# Patient Record
Sex: Male | Born: 1955 | Race: Black or African American | Hispanic: No | Marital: Single | Smoking: Never smoker
Health system: Southern US, Community
[De-identification: ages and names within clinical notes are randomized; demographics above are authoritative.]

## PROBLEM LIST (undated history)

## (undated) DIAGNOSIS — E119 Type 2 diabetes mellitus without complications: Secondary | ICD-10-CM

## (undated) HISTORY — PX: SHOULDER SURGERY: SHX246

---

## 2015-06-25 ENCOUNTER — Emergency Department (HOSPITAL_COMMUNITY): Payer: Non-veteran care

## 2015-06-25 ENCOUNTER — Encounter (HOSPITAL_COMMUNITY): Payer: Self-pay | Admitting: Emergency Medicine

## 2015-06-25 ENCOUNTER — Inpatient Hospital Stay (HOSPITAL_COMMUNITY): Payer: Non-veteran care

## 2015-06-25 ENCOUNTER — Inpatient Hospital Stay (HOSPITAL_COMMUNITY)
Admission: EM | Admit: 2015-06-25 | Discharge: 2015-07-06 | DRG: 336 | Disposition: A | Discharge status: Home / Self Care | Payer: Non-veteran care | Attending: General Surgery | Admitting: General Surgery

## 2015-06-25 DIAGNOSIS — E860 Dehydration: Secondary | ICD-10-CM | POA: Diagnosis present

## 2015-06-25 DIAGNOSIS — E876 Hypokalemia: Secondary | ICD-10-CM | POA: Diagnosis present

## 2015-06-25 DIAGNOSIS — Z0189 Encounter for other specified special examinations: Secondary | ICD-10-CM

## 2015-06-25 DIAGNOSIS — R188 Other ascites: Secondary | ICD-10-CM | POA: Diagnosis present

## 2015-06-25 DIAGNOSIS — Z9114 Patient's other noncompliance with medication regimen: Secondary | ICD-10-CM | POA: Diagnosis not present

## 2015-06-25 DIAGNOSIS — R739 Hyperglycemia, unspecified: Secondary | ICD-10-CM

## 2015-06-25 DIAGNOSIS — K565 Intestinal adhesions [bands] with obstruction (postprocedural) (postinfection): Principal | ICD-10-CM | POA: Diagnosis present

## 2015-06-25 DIAGNOSIS — K56609 Unspecified intestinal obstruction, unspecified as to partial versus complete obstruction: Secondary | ICD-10-CM | POA: Diagnosis present

## 2015-06-25 DIAGNOSIS — E119 Type 2 diabetes mellitus without complications: Secondary | ICD-10-CM | POA: Diagnosis present

## 2015-06-25 HISTORY — DX: Type 2 diabetes mellitus without complications: E11.9

## 2015-06-25 LAB — COMPREHENSIVE METABOLIC PANEL
ALBUMIN: 3.9 g/dL (ref 3.5–5.0)
ALK PHOS: 106 U/L (ref 38–126)
ALT: 17 U/L (ref 17–63)
ANION GAP: 15 (ref 5–15)
AST: 16 U/L (ref 15–41)
BILIRUBIN TOTAL: 1.5 mg/dL — AB (ref 0.3–1.2)
BUN: 20 mg/dL (ref 6–20)
CALCIUM: 9.3 mg/dL (ref 8.9–10.3)
CO2: 28 mmol/L (ref 22–32)
Chloride: 89 mmol/L — ABNORMAL LOW (ref 101–111)
Creatinine, Ser: 1.72 mg/dL — ABNORMAL HIGH (ref 0.61–1.24)
GFR calc Af Amer: 48 mL/min — ABNORMAL LOW (ref >60)
GFR, EST NON AFRICAN AMERICAN: 41 mL/min — AB (ref >60)
GLUCOSE: 345 mg/dL — AB (ref 65–99)
Potassium: 3.7 mmol/L (ref 3.5–5.1)
Sodium: 132 mmol/L — ABNORMAL LOW (ref 135–145)
TOTAL PROTEIN: 8.2 g/dL — AB (ref 6.5–8.1)

## 2015-06-25 LAB — URINALYSIS, ROUTINE W REFLEX MICROSCOPIC
Glucose, UA: >1000 mg/dL — AB
Hgb urine dipstick: NEGATIVE
KETONES UR: 15 mg/dL — AB
NITRITE: NEGATIVE
PROTEIN: 30 mg/dL — AB
Specific Gravity, Urine: 1.046 — ABNORMAL HIGH (ref 1.005–1.030)
pH: 5.5 (ref 5.0–8.0)

## 2015-06-25 LAB — CBC
HCT: 52.3 % — ABNORMAL HIGH (ref 39.0–52.0)
HCT: 47.4 % (ref 39.0–52.0)
Hemoglobin: 17.8 g/dL — ABNORMAL HIGH (ref 13.0–17.0)
Hemoglobin: 16 g/dL (ref 13.0–17.0)
MCH: 32.5 pg (ref 26.0–34.0)
MCH: 31.9 pg (ref 26.0–34.0)
MCHC: 33.8 g/dL (ref 30.0–36.0)
MCHC: 34 g/dL (ref 30.0–36.0)
MCV: 95.6 fL (ref 78.0–100.0)
MCV: 94.4 fL (ref 78.0–100.0)
PLATELETS: 166 10*3/uL (ref 150–400)
Platelets: 192 10*3/uL (ref 150–400)
RBC: 5.47 MIL/uL (ref 4.22–5.81)
RBC: 5.02 MIL/uL (ref 4.22–5.81)
RDW: 12.7 % (ref 11.5–15.5)
RDW: 12.9 % (ref 11.5–15.5)
WBC: 11.7 10*3/uL — AB (ref 4.0–10.5)
WBC: 8.5 10*3/uL (ref 4.0–10.5)

## 2015-06-25 LAB — GLUCOSE, CAPILLARY
GLUCOSE-CAPILLARY: 281 mg/dL — AB (ref 65–99)
Glucose-Capillary: 203 mg/dL — ABNORMAL HIGH (ref 65–99)
Glucose-Capillary: 119 mg/dL — ABNORMAL HIGH (ref 65–99)
Glucose-Capillary: 174 mg/dL — ABNORMAL HIGH (ref 65–99)

## 2015-06-25 LAB — URINE MICROSCOPIC-ADD ON

## 2015-06-25 LAB — LIPASE, BLOOD: Lipase: 20 U/L (ref 11–51)

## 2015-06-25 LAB — CREATININE, SERUM
CREATININE: 1.61 mg/dL — AB (ref 0.61–1.24)
GFR calc Af Amer: 52 mL/min — ABNORMAL LOW (ref >60)
GFR calc non Af Amer: 45 mL/min — ABNORMAL LOW (ref >60)

## 2015-06-25 MED ORDER — DIATRIZOATE MEGLUMINE & SODIUM 66-10 % PO SOLN
90.0000 mL | Freq: Once | ORAL | Status: AC
  Administered 2015-06-25: 90 mL via NASOGASTRIC
  Filled 2015-06-25: qty 90

## 2015-06-25 MED ORDER — HYDROMORPHONE HCL 1 MG/ML IJ SOLN
1.0000 mg | Freq: Once | INTRAMUSCULAR | Status: AC
  Administered 2015-06-25: 1 mg via INTRAVENOUS
  Filled 2015-06-25: qty 1

## 2015-06-25 MED ORDER — PANTOPRAZOLE SODIUM 40 MG IV SOLR
40.0000 mg | Freq: Every day | INTRAVENOUS | Status: DC
  Administered 2015-06-25 – 2015-07-05 (×11): 40 mg via INTRAVENOUS
  Filled 2015-06-25 (×11): qty 40

## 2015-06-25 MED ORDER — PHENOL 1.4 % MT LIQD
1.0000 | OROMUCOSAL | Status: DC | PRN
  Administered 2015-06-26: 1 via OROMUCOSAL
  Filled 2015-06-25: qty 177

## 2015-06-25 MED ORDER — ENOXAPARIN SODIUM 40 MG/0.4ML ~~LOC~~ SOLN
40.0000 mg | SUBCUTANEOUS | Status: DC
  Administered 2015-06-25 – 2015-07-05 (×11): 40 mg via SUBCUTANEOUS
  Filled 2015-06-25 (×11): qty 0.4

## 2015-06-25 MED ORDER — MORPHINE SULFATE (PF) 2 MG/ML IV SOLN
2.0000 mg | INTRAVENOUS | Status: DC | PRN
  Administered 2015-06-25 – 2015-06-26 (×3): 2 mg via INTRAVENOUS
  Filled 2015-06-25 (×3): qty 1

## 2015-06-25 MED ORDER — ONDANSETRON 4 MG PO TBDP: 4.0000 mg | ORAL_TABLET | Freq: Four times a day (QID) | ORAL | Status: DC | PRN

## 2015-06-25 MED ORDER — IOPAMIDOL (ISOVUE-300) INJECTION 61%
INTRAVENOUS | Status: AC
  Administered 2015-06-25: 100 mL
  Filled 2015-06-25: qty 100

## 2015-06-25 MED ORDER — ONDANSETRON HCL 4 MG/2ML IJ SOLN
4.0000 mg | Freq: Once | INTRAMUSCULAR | Status: AC
  Administered 2015-06-25: 4 mg via INTRAVENOUS
  Filled 2015-06-25: qty 2

## 2015-06-25 MED ORDER — SODIUM CHLORIDE 0.9 % IV BOLUS (SEPSIS)
1000.0000 mL | Freq: Once | INTRAVENOUS | Status: AC
  Administered 2015-06-25: 1000 mL via INTRAVENOUS

## 2015-06-25 MED ORDER — DIPHENHYDRAMINE HCL 12.5 MG/5ML PO ELIX: 12.5000 mg | ORAL_SOLUTION | Freq: Four times a day (QID) | ORAL | Status: DC | PRN

## 2015-06-25 MED ORDER — DIPHENHYDRAMINE HCL 50 MG/ML IJ SOLN: 12.5000 mg | Freq: Four times a day (QID) | INTRAMUSCULAR | Status: DC | PRN

## 2015-06-25 MED ORDER — ONDANSETRON HCL 4 MG/2ML IJ SOLN
4.0000 mg | Freq: Four times a day (QID) | INTRAMUSCULAR | Status: DC | PRN
  Administered 2015-07-02 (×2): 4 mg via INTRAVENOUS
  Filled 2015-06-25 (×3): qty 2

## 2015-06-25 MED ORDER — LIVING WELL WITH DIABETES BOOK
Freq: Once | Status: AC
  Administered 2015-06-25: 16:00:00
  Filled 2015-06-25: qty 1

## 2015-06-25 MED ORDER — INSULIN ASPART 100 UNIT/ML ~~LOC~~ SOLN
0.0000 [IU] | SUBCUTANEOUS | Status: DC
  Administered 2015-06-25: 8 [IU] via SUBCUTANEOUS
  Administered 2015-06-25: 3 [IU] via SUBCUTANEOUS
  Administered 2015-06-25: 5 [IU] via SUBCUTANEOUS
  Administered 2015-06-26 – 2015-06-28 (×6): 2 [IU] via SUBCUTANEOUS
  Administered 2015-06-29: 3 [IU] via SUBCUTANEOUS
  Administered 2015-06-29: 2 [IU] via SUBCUTANEOUS
  Administered 2015-06-29: 3 [IU] via SUBCUTANEOUS
  Administered 2015-06-29: 2 [IU] via SUBCUTANEOUS
  Administered 2015-06-29 – 2015-06-30 (×2): 3 [IU] via SUBCUTANEOUS
  Administered 2015-06-30 (×2): 2 [IU] via SUBCUTANEOUS
  Administered 2015-06-30 – 2015-07-01 (×4): 3 [IU] via SUBCUTANEOUS
  Administered 2015-07-01: 5 [IU] via SUBCUTANEOUS
  Administered 2015-07-01: 2 [IU] via SUBCUTANEOUS
  Administered 2015-07-01: 3 [IU] via SUBCUTANEOUS
  Administered 2015-07-01: 5 [IU] via SUBCUTANEOUS
  Administered 2015-07-02 (×5): 3 [IU] via SUBCUTANEOUS
  Administered 2015-07-02: 5 [IU] via SUBCUTANEOUS
  Administered 2015-07-03: 3 [IU] via SUBCUTANEOUS
  Administered 2015-07-03 (×3): 2 [IU] via SUBCUTANEOUS
  Administered 2015-07-03: 5 [IU] via SUBCUTANEOUS
  Administered 2015-07-04: 3 [IU] via SUBCUTANEOUS
  Administered 2015-07-04: 2 [IU] via SUBCUTANEOUS
  Administered 2015-07-04: 5 [IU] via SUBCUTANEOUS
  Administered 2015-07-04 (×3): 2 [IU] via SUBCUTANEOUS
  Administered 2015-07-05: 5 [IU] via SUBCUTANEOUS
  Administered 2015-07-05: 2 [IU] via SUBCUTANEOUS
  Administered 2015-07-05: 3 [IU] via SUBCUTANEOUS
  Administered 2015-07-05: 2 [IU] via SUBCUTANEOUS
  Administered 2015-07-05: 3 [IU] via SUBCUTANEOUS
  Administered 2015-07-05: 2 [IU] via SUBCUTANEOUS
  Administered 2015-07-06 (×4): 3 [IU] via SUBCUTANEOUS

## 2015-06-25 MED ORDER — MORPHINE SULFATE (PF) 4 MG/ML IV SOLN
4.0000 mg | Freq: Once | INTRAVENOUS | Status: AC
Start: 2015-06-25 — End: 2015-06-25
  Administered 2015-06-25: 4 mg via INTRAVENOUS
  Filled 2015-06-25: qty 1

## 2015-06-25 MED ORDER — POTASSIUM CHLORIDE IN NACL 20-0.9 MEQ/L-% IV SOLN
INTRAVENOUS | Status: DC
  Administered 2015-06-25 – 2015-06-27 (×6): via INTRAVENOUS
  Administered 2015-06-27: 125 mL/h via INTRAVENOUS
  Administered 2015-06-28: 1000 mL via INTRAVENOUS
  Administered 2015-06-28: 15:00:00 via INTRAVENOUS
  Filled 2015-06-25 (×10): qty 1000

## 2015-06-25 NOTE — Care Management (Signed)
Not[facility] admission. Patient's PCP is Dr Lucrezia StarchKeisoer 1 [phone] .  Ronny FlurryHeather Aylani Spurlock RN BSN [phone]

## 2015-06-25 NOTE — ED Provider Notes (Signed)
CSN: [id number]     Arrival date & time 06/25/15  0044 History  By signing my name below, I, Bethel Born, attest that this documentation has been prepared under the direction and in the presence of Zadie Rhine, MD. Electronically Signed: Bethel Born, ED Scribe. 06/25/2015. 2:02 AM   Chief Complaint  Patient presents with  . Abdominal Pain    Patient is a 60 y.o. male presenting with abdominal pain. The history is provided by the patient. No language interpreter was used.  Abdominal Pain Pain location:  Generalized Pain quality: cramping and pressure   Pain radiates to:  Does not radiate Pain severity:  Severe Onset quality:  Gradual Duration:  4 days Timing:  Constant Progression:  Worsening Chronicity:  New Context: laxative use   Relieved by:  Nothing Worsened by:  Movement Ineffective treatments:  None tried Associated symptoms: anorexia, constipation, fever, nausea and vomiting   Associated symptoms: no chest pain   Risk factors: has not had multiple surgeries    Justin Yang is a 60 y.o. male with PMHx of DM who presents to the Emergency Department complaining of new, constant, gradually worsening, 10/10 in severity, cramping/pressure-like, generalized abdominal pain and bloating with onset 4 days ago after taking a laxative. The pain is worse with movement. Associated symptoms include fever, anorexia, decreased urination ("I've urinated maybe a cup in the last 4 days), constipation, nausea, and vomiting. Pt states that every time he attempts to eat or drink he vomits.  Pt denies chest pain, back pain, groin pain, and testicular pain. No history of MI or stroke. No history of abdominal surgery. He denies smoking.   Past Medical History  Diagnosis Date  . Diabetes mellitus without complication [facility])    Past Surgical History  Procedure Laterality Date  . Shoulder surgery     No family history on file. Social History  Substance Use Topics  . Smoking status:  Never Smoker   . Smokeless tobacco: None  . Alcohol Use: Yes    Review of Systems  Constitutional: Positive for fever and appetite change.  Cardiovascular: Negative for chest pain.  Gastrointestinal: Positive for nausea, vomiting, abdominal pain, constipation, abdominal distention and anorexia.  Genitourinary:       Decreased urination  Musculoskeletal: Negative for back pain.  All other systems reviewed and are negative.   Allergies  Review of patient's allergies indicates no known allergies.  Home Medications   Prior to Admission medications   Not on File   There were no vitals taken for this visit. Physical Exam CONSTITUTIONAL: Well developed/well nourished HEAD: Normocephalic/atraumatic EYES: EOMI/PERRL ENMT: Mucous membranes moist NECK: supple no meningeal signs SPINE/BACK:entire spine nontender CV: S1/S2 noted, no murmurs/rubs/gallops noted LUNGS: Lungs are clear to auscultation bilaterally, no apparent distress ABDOMEN: soft, severe diffuse tenderness, no rebound, guarding noted, decreased bowel sounds noted throughout abdomen GU:no cva tenderness NEURO: Pt is awake/alert/appropriate, moves all extremitiesx4.  No facial droop.   EXTREMITIES: pulses normal/equal, full ROM SKIN: warm, color normal PSYCH: no abnormalities of mood noted, alert and oriented to situation  ED Course  Procedures COORDINATION OF CARE: 1:51 AM Discussed treatment plan which includes lab work, abdominal XR, Dilaudid, Zofran, and IVF with pt at bedside and pt agreed to plan. 2:33 AM Abdominal imaging reveals acute bowel obstruction No free air noted Will get CT imaging Pt updated on plan 4:55 AM CT scan shows bowel obstruction Pt stable at this time BP 105/75 mmHg  Pulse 93  Temp(Src) 98 F (  36.7 C) (Oral)  Resp 18  SpO2 95% D/w dr Corliss Skainstsuei with surgery Will eval patient for admission He requests NG tube  Labs Review Labs Reviewed  COMPREHENSIVE METABOLIC PANEL - Abnormal;  Notable for the following:    Sodium 132 (*)    Chloride 89 (*)    Glucose, Bld 345 (*)    Creatinine, Ser 1.72 (*)    Total Protein 8.2 (*)    Total Bilirubin 1.5 (*)    GFR calc non Af Amer 41 (*)    GFR calc Af Amer 48 (*)    All other components within normal limits  CBC - Abnormal; Notable for the following:    WBC 11.7 (*)    Hemoglobin 17.8 (*)    HCT 52.3 (*)    All other components within normal limits  URINALYSIS, ROUTINE W REFLEX MICROSCOPIC (NOT AT [facility]) - Abnormal; Notable for the following:    Color, Urine AMBER (*)    Specific Gravity, Urine 1.046 (*)    Glucose, UA >1000 (*)    Bilirubin Urine SMALL (*)    Ketones, ur 15 (*)    Protein, ur 30 (*)    Leukocytes, UA SMALL (*)    All other components within normal limits  URINE MICROSCOPIC-ADD ON - Abnormal; Notable for the following:    Squamous Epithelial / LPF 6-30 (*)    Bacteria, UA FEW (*)    Casts HYALINE CASTS (*)    All other components within normal limits  LIPASE, BLOOD    Imaging Review Ct Abdomen Pelvis W Contrast  06/25/2015  CLINICAL DATA:  Mid abdominal pain with emesis, onset this week after taking laxatives for constipation. Pain is now all over the abdomen. EXAM: CT ABDOMEN AND PELVIS WITH CONTRAST TECHNIQUE: Multidetector CT imaging of the abdomen and pelvis was performed using the standard protocol following bolus administration of intravenous contrast. CONTRAST:  100mL ISOVUE-300 IOPAMIDOL (ISOVUE-300) INJECTION 61% COMPARISON:  Abdominal series [id number] FINDINGS: Atelectasis or infiltration in the lung bases. Mild diffuse fatty infiltration of the liver. The gallbladder, pancreas, spleen, adrenal glands, abdominal aorta, inferior vena cava, and retroperitoneal lymph nodes are unremarkable. Multiple cysts in the upper pole left kidney. No hydronephrosis in either kidney. The stomach is decompressed. Dilated mostly fluid-filled small bowel with decompression of the terminal ileum. A transition zone  appears to be in the right mid abdomen anteriorly of, or there is a tight kink. No mass is identified. Appearance likely due to adhesion. Small amount of free fluid in the mesenteries probably reactive. No bowel wall thickening or pneumatosis. No free air. Pelvis: The appendix is normal. Prostate gland is mildly prominent at 3.8 cm diameter. Bladder wall is not thickened. Scattered diverticula in the sigmoid colon without evidence of diverticulitis. Colon is mostly decompressed with scattered stool present. No free or loculated pelvic fluid collections. No pelvic mass or lymphadenopathy. Degenerative changes in the spine. No destructive bone lesions. IMPRESSION: Small bowel obstruction with transition zone in the right mid abdomen. No obstructing mass lesion identified. Changes likely due to adhesion. Mesenteric edema is likely reactive. Diffuse fatty infiltration of the liver. Electronically Signed   By: Burman NievesWilliam  Stevens M.D.   On: 06/25/2015 04:05   Dg Abd Acute W/chest  06/25/2015  CLINICAL DATA:  60 year old male with abdominal pain nausea and vomiting EXAM: DG ABDOMEN ACUTE W/ 1V CHEST COMPARISON:  None. FINDINGS: The lungs are clear. Left lung base linear/platelike atelectasis/ scarring. No focal consolidation, pleural effusion, or pneumothorax. The  cardiac silhouette is within normal limits. Multiple dilated loops of small bowel with air-fluid levels noted most compatible with small bowel obstruction. Small amount of air noted mixed with stool in proximal colon. There is no free air. No radiopaque calculi. There is degenerative changes of the spine. No acute fracture. IMPRESSION: Dilated loops of small bowel with air-fluid levels most compatible with small bowel obstruction. Follow-up recommended. Electronically Signed   By: Elgie Collard M.D.   On: 06/25/2015 02:22   I have personally reviewed and evaluated these images and lab results as part of my medical decision-making.   EKG  Interpretation   Date/Time:  Friday Jun 25 2015 01:25:56 EDT Ventricular Rate:  104 PR Interval:  156 QRS Duration: 88 QT Interval:  340 QTC Calculation: 447 R Axis:   17 Text Interpretation:  Sinus tachycardia Borderline T abnormalities,  inferior leads Abnormal ekg No previous ECGs available Confirmed by  Bebe Shaggy  MD, Rondrick Barreira ([id number]) on 06/25/2015 1:40:11 AM     Medications  sodium chloride 0.9 % bolus 1,000 mL (1,000 mLs Intravenous New Bag/Given 06/25/15 0226)  ondansetron (ZOFRAN) injection 4 mg (4 mg Intravenous Given 06/25/15 0226)  HYDROmorphone (DILAUDID) injection 1 mg (1 mg Intravenous Given 06/25/15 0226)    MDM   Final diagnoses:  Small bowel obstruction (HCC)  Dehydration  Hyperglycemia    Nursing notes including past medical history and social history reviewed and considered in documentation xrays/imaging reviewed by myself and considered during evaluation Labs/vital reviewed myself and considered during evaluation    I personally performed the services described in this documentation, which was scribed in my presence. The recorded information has been reviewed and is accurate.       Zadie Rhine, MD 06/25/15 [phone]

## 2015-06-25 NOTE — ED Notes (Signed)
Pt. reports mid abdominal pain with emesis  onset this week after taking laxative for constipation , denies diarrhea or fever .

## 2015-06-25 NOTE — Care Management Note (Signed)
Case Management Note  Patient Details  Name: Justin Yang MRN: [id number] Date of Birth: [birth year]  Subjective/Objective:                    Action/Plan:   Expected Discharge Date:                  Expected Discharge Plan:  Home/Self Care  In-House Referral:     Discharge planning Services     Post Acute Care Choice:    Choice offered to:     DME Arranged:    DME Agency:     HH Arranged:    HH Agency:     Status of Service:  In process, will continue to follow  Medicare Important Message Given:    Date Medicare IM Given:    Medicare IM give by:    Date Additional Medicare IM Given:    Additional Medicare Important Message give by:     If discussed at Long Length of Stay Meetings, dates discussed:    Additional Comments:  Kingsley PlanWile, Donovan Persley Marie, RN 06/25/2015, 1:51 PM

## 2015-06-25 NOTE — Progress Notes (Signed)
[name], male   DOB: [birth year], 60 y.o.   MRN: [id number]     [facility]      [address] [place], [place] [address]    Phone: [phone] FAX: [phone]     Subjective: No n/v.  Little output, gastrografin given, feels bloated now.  Passed flatus last night.  Last BM on Monday.  Walking in hallways.   Objective:  Vital signs:  Filed Vitals:   06/25/15 0407 06/25/15 0430 06/25/15 0500 06/25/15 0613  BP: 119/92 105/75 128/96 125/90  Pulse: 99 93 94 97  Temp: 98 F (36.7 C)   97.5 F (36.4 C)  TempSrc: Oral   Axillary  Resp: 18   18  Height:    '5\' 11"'$  (1.803 m)  Weight:    85.276 kg (188 lb)  SpO2: 96% 95% 96% 95%       Intake/Output   Yesterday:  05/18 0701 - 05/19 0700 In: 1000 [I.V.:1000] Out: -  This shift: I/O last 3 completed shifts: In: 1000 [I.V.:1000] Out: -        Physical Exam: General: Pt awake/alert/oriented x4 in no acute distress Chest: cta No chest wall pain w good excursion CV:  Pulses intact.  Regular rhythm MS: Normal AROM mjr joints.  No obvious deformity Abdomen: Soft.  distended.  Non tender.  No evidence of peritonitis.  No incarcerated hernias. Ext:  SCDs BLE.  No mjr edema.  No cyanosis Skin: No petechiae / purpura   Problem List:   Active Problems:   Small bowel obstruction (HCC)    Results:   Labs: Results for orders placed or performed during the hospital encounter of 06/25/15 (from the past 48 hour(s))  Lipase, blood     Status: None   Collection Time: 06/25/15 12:55 AM  Result Value Ref Range   Lipase 20 11 - 51 U/L  Comprehensive metabolic panel     Status: Abnormal   Collection Time: 06/25/15 12:55 AM  Result Value Ref Range   Sodium 132 (L) 135 - 145 mmol/L   Potassium 3.7 3.5 - 5.1 mmol/L   Chloride 89 (L) 101 - 111 mmol/L   CO2 28 22 - 32 mmol/L   Glucose, Bld 345 (H) 65 - 99 mg/dL   BUN 20 6 - 20 mg/dL   Creatinine, Ser 1.72 (H)  0.61 - 1.24 mg/dL   Calcium 9.3 8.9 - 10.3 mg/dL   Total Protein 8.2 (H) 6.5 - 8.1 g/dL   Albumin 3.9 3.5 - 5.0 g/dL   AST 16 15 - 41 U/L   ALT 17 17 - 63 U/L   Alkaline Phosphatase 106 38 - 126 U/L   Total Bilirubin 1.5 (H) 0.3 - 1.2 mg/dL   GFR calc non Af Amer 41 (L) >60 mL/min   GFR calc Af Amer 48 (L) >60 mL/min    Comment: (NOTE) The eGFR has been calculated using the CKD EPI equation. This calculation has not been validated in all clinical situations. eGFR's persistently <60 mL/min signify possible Chronic Kidney Disease.    Anion gap 15 5 - 15  CBC     Status: Abnormal   Collection Time: 06/25/15 12:55 AM  Result Value Ref Range   WBC 11.7 (H) 4.0 - 10.5 K/uL   RBC 5.47 4.22 - 5.81 MIL/uL   Hemoglobin 17.8 (H) 13.0 - 17.0 g/dL   HCT 52.3 (H) 39.0 - 52.0 %   MCV 95.6 78.0 -  100.0 fL   MCH 32.5 26.0 - 34.0 pg   MCHC 34.0 30.0 - 36.0 g/dL   RDW 12.7 11.5 - 15.5 %   Platelets 192 150 - 400 K/uL  Urinalysis, Routine w reflex microscopic     Status: Abnormal   Collection Time: 06/25/15  1:10 AM  Result Value Ref Range   Color, Urine AMBER (A) YELLOW    Comment: BIOCHEMICALS MAY BE AFFECTED BY COLOR   APPearance CLEAR CLEAR   Specific Gravity, Urine 1.046 (H) 1.005 - 1.030   pH 5.5 5.0 - 8.0   Glucose, UA >1000 (A) NEGATIVE mg/dL   Hgb urine dipstick NEGATIVE NEGATIVE   Bilirubin Urine SMALL (A) NEGATIVE   Ketones, ur 15 (A) NEGATIVE mg/dL   Protein, ur 30 (A) NEGATIVE mg/dL   Nitrite NEGATIVE NEGATIVE   Leukocytes, UA SMALL (A) NEGATIVE  Urine microscopic-add on     Status: Abnormal   Collection Time: 06/25/15  1:10 AM  Result Value Ref Range   Squamous Epithelial / LPF 6-30 (A) NONE SEEN   WBC, UA 6-30 0 - 5 WBC/hpf   RBC / HPF 0-5 0 - 5 RBC/hpf   Bacteria, UA FEW (A) NONE SEEN   Casts HYALINE CASTS (A) NEGATIVE  CBC     Status: None   Collection Time: 06/25/15  7:21 AM  Result Value Ref Range   WBC 8.5 4.0 - 10.5 K/uL   RBC 5.02 4.22 - 5.81 MIL/uL    Hemoglobin 16.0 13.0 - 17.0 g/dL   HCT 47.4 39.0 - 52.0 %   MCV 94.4 78.0 - 100.0 fL   MCH 31.9 26.0 - 34.0 pg   MCHC 33.8 30.0 - 36.0 g/dL   RDW 12.9 11.5 - 15.5 %   Platelets 166 150 - 400 K/uL  Creatinine, serum     Status: Abnormal   Collection Time: 06/25/15  7:21 AM  Result Value Ref Range   Creatinine, Ser 1.61 (H) 0.61 - 1.24 mg/dL   GFR calc non Af Amer 45 (L) >60 mL/min   GFR calc Af Amer 52 (L) >60 mL/min    Comment: (NOTE) The eGFR has been calculated using the CKD EPI equation. This calculation has not been validated in all clinical situations. eGFR's persistently <60 mL/min signify possible Chronic Kidney Disease.   Glucose, capillary     Status: Abnormal   Collection Time: 06/25/15  7:27 AM  Result Value Ref Range   Glucose-Capillary 281 (H) 65 - 99 mg/dL    Imaging / Studies: Ct Abdomen Pelvis W Contrast  06/25/2015  CLINICAL DATA:  Mid abdominal pain with emesis, onset this week after taking laxatives for constipation. Pain is now all over the abdomen. EXAM: CT ABDOMEN AND PELVIS WITH CONTRAST TECHNIQUE: Multidetector CT imaging of the abdomen and pelvis was performed using the standard protocol following bolus administration of intravenous contrast. CONTRAST:  145m ISOVUE-300 IOPAMIDOL (ISOVUE-300) INJECTION 61% COMPARISON:  Abdominal series [phone]: Atelectasis or infiltration in the lung bases. Mild diffuse fatty infiltration of the liver. The gallbladder, pancreas, spleen, adrenal glands, abdominal aorta, inferior vena cava, and retroperitoneal lymph nodes are unremarkable. Multiple cysts in the upper pole left kidney. No hydronephrosis in either kidney. The stomach is decompressed. Dilated mostly fluid-filled small bowel with decompression of the terminal ileum. A transition zone appears to be in the right mid abdomen anteriorly of, or there is a tight kink. No mass is identified. Appearance likely due to adhesion. Small amount of free fluid in  the mesenteries  probably reactive. No bowel wall thickening or pneumatosis. No free air. Pelvis: The appendix is normal. Prostate gland is mildly prominent at 3.8 cm diameter. Bladder wall is not thickened. Scattered diverticula in the sigmoid colon without evidence of diverticulitis. Colon is mostly decompressed with scattered stool present. No free or loculated pelvic fluid collections. No pelvic mass or lymphadenopathy. Degenerative changes in the spine. No destructive bone lesions. IMPRESSION: Small bowel obstruction with transition zone in the right mid abdomen. No obstructing mass lesion identified. Changes likely due to adhesion. Mesenteric edema is likely reactive. Diffuse fatty infiltration of the liver. Electronically Signed   By: Lucienne Capers M.D.   On: 06/25/2015 04:05   Dg Abd Acute W/chest  06/25/2015  CLINICAL DATA:  60 year old male with abdominal pain nausea and vomiting EXAM: DG ABDOMEN ACUTE W/ 1V CHEST COMPARISON:  None. FINDINGS: The lungs are clear. Left lung base linear/platelike atelectasis/ scarring. No focal consolidation, pleural effusion, or pneumothorax. The cardiac silhouette is within normal limits. Multiple dilated loops of small bowel with air-fluid levels noted most compatible with small bowel obstruction. Small amount of air noted mixed with stool in proximal colon. There is no free air. No radiopaque calculi. There is degenerative changes of the spine. No acute fracture. IMPRESSION: Dilated loops of small bowel with air-fluid levels most compatible with small bowel obstruction. Follow-up recommended. Electronically Signed   By: Anner Crete M.D.   On: 06/25/2015 02:22   Dg Abd Portable 1v-small Bowel Protocol-position Verification  06/25/2015  CLINICAL DATA:  Nasogastric tube placement. EXAM: PORTABLE ABDOMEN - 1 VIEW COMPARISON:  CT 06/25/2015. FINDINGS: NG tube noted with tip below left hemidiaphragm. Persistent dilated loops of small bowel consistent small bowel obstruction  noted. Paucity of gas noted within the colon. No free air. Contrast in the bladder from prior CT. IMPRESSION: 1. NG tube noted with tip in the stomach. 2. Persistent prominently dilated small bowel loops consistent small bowel obstruction. Electronically Signed   By: Marcello Moores  Register   On: 06/25/2015 08:10    Medications / Allergies:  Scheduled Meds: . enoxaparin (LOVENOX) injection  40 mg Subcutaneous Q24H  . insulin aspart  0-15 Units Subcutaneous Q4H  . pantoprazole (PROTONIX) IV  40 mg Intravenous QHS   Continuous Infusions: . 0.9 % NaCl with KCl 20 mEq / L 125 mL/hr at 06/25/15 0635   PRN Meds:.diphenhydrAMINE **OR** diphenhydrAMINE, morphine injection, ondansetron **OR** ondansetron (ZOFRAN) IV, phenol  Antibiotics: Anti-infectives    None        Assessment/Plan sbo-no previous surgeries.  gastrografin given 0830, will follow up films later today.  Hopefully with resolve with non operative management. DM II-non compliant with meds before admission.  Await a1c.  Will ask DM educator to see VTE prophylaxis-SCD/lovenox Dispo-sbo   Erby Pian, [facility] Pager [phone]) For consults and floor pages call [phone](7A-4:30P)  06/25/2015 10:30 AM

## 2015-06-25 NOTE — H&P (Signed)
Justin Yang is an 60 y.o. male.   Chief Complaint: Abdominal distention, nausea, vomiting HPI: This is a 60yo male with a history of diabetes type 2 who presents with a four day history of progressive abdominal distention, abdominal pain, nausea, and vomiting.  He has had very poor PO intake and decreased urination.  Last BM was several days ago.  He has attempted to use laxatives without any improvement.  He has been unable to take his Metformin.  No previous abdominal surgery.  He presented to the ED for evaluation and was found to have findings consistent with an SBO.  Past Medical History  Diagnosis Date  . Diabetes mellitus without complication [facility])     Past Surgical History  Procedure Laterality Date  . Shoulder surgery      No family history on file. Social History:  reports that he has never smoked. He does not have any smokeless tobacco history on file. He reports that he drinks alcohol. He reports that he does not use illicit drugs.  Allergies: No Known Allergies  Meds:  Metformin  Results for orders placed or performed during the hospital encounter of 06/25/15 (from the past 48 hour(s))  Lipase, blood     Status: None   Collection Time: 06/25/15 12:55 AM  Result Value Ref Range   Lipase 20 11 - 51 U/L  Comprehensive metabolic panel     Status: Abnormal   Collection Time: 06/25/15 12:55 AM  Result Value Ref Range   Sodium 132 (L) 135 - 145 mmol/L   Potassium 3.7 3.5 - 5.1 mmol/L   Chloride 89 (L) 101 - 111 mmol/L   CO2 28 22 - 32 mmol/L   Glucose, Bld 345 (H) 65 - 99 mg/dL   BUN 20 6 - 20 mg/dL   Creatinine, Ser 1.72 (H) 0.61 - 1.24 mg/dL   Calcium 9.3 8.9 - 10.3 mg/dL   Total Protein 8.2 (H) 6.5 - 8.1 g/dL   Albumin 3.9 3.5 - 5.0 g/dL   AST 16 15 - 41 U/L   ALT 17 17 - 63 U/L   Alkaline Phosphatase 106 38 - 126 U/L   Total Bilirubin 1.5 (H) 0.3 - 1.2 mg/dL   GFR calc non Af Amer 41 (L) >60 mL/min   GFR calc Af Amer 48 (L) >60 mL/min    Comment: (NOTE) The  eGFR has been calculated using the CKD EPI equation. This calculation has not been validated in all clinical situations. eGFR's persistently <60 mL/min signify possible Chronic Kidney Disease.    Anion gap 15 5 - 15  CBC     Status: Abnormal   Collection Time: 06/25/15 12:55 AM  Result Value Ref Range   WBC 11.7 (H) 4.0 - 10.5 K/uL   RBC 5.47 4.22 - 5.81 MIL/uL   Hemoglobin 17.8 (H) 13.0 - 17.0 g/dL   HCT 52.3 (H) 39.0 - 52.0 %   MCV 95.6 78.0 - 100.0 fL   MCH 32.5 26.0 - 34.0 pg   MCHC 34.0 30.0 - 36.0 g/dL   RDW 12.7 11.5 - 15.5 %   Platelets 192 150 - 400 K/uL  Urinalysis, Routine w reflex microscopic     Status: Abnormal   Collection Time: 06/25/15  1:10 AM  Result Value Ref Range   Color, Urine AMBER (A) YELLOW    Comment: BIOCHEMICALS MAY BE AFFECTED BY COLOR   APPearance CLEAR CLEAR   Specific Gravity, Urine 1.046 (H) 1.005 - 1.030   pH 5.5  5.0 - 8.0   Glucose, UA >1000 (A) NEGATIVE mg/dL   Hgb urine dipstick NEGATIVE NEGATIVE   Bilirubin Urine SMALL (A) NEGATIVE   Ketones, ur 15 (A) NEGATIVE mg/dL   Protein, ur 30 (A) NEGATIVE mg/dL   Nitrite NEGATIVE NEGATIVE   Leukocytes, UA SMALL (A) NEGATIVE  Urine microscopic-add on     Status: Abnormal   Collection Time: 06/25/15  1:10 AM  Result Value Ref Range   Squamous Epithelial / LPF 6-30 (A) NONE SEEN   WBC, UA 6-30 0 - 5 WBC/hpf   RBC / HPF 0-5 0 - 5 RBC/hpf   Bacteria, UA FEW (A) NONE SEEN   Casts HYALINE CASTS (A) NEGATIVE   Ct Abdomen Pelvis W Contrast  06/25/2015  CLINICAL DATA:  Mid abdominal pain with emesis, onset this week after taking laxatives for constipation. Pain is now all over the abdomen. EXAM: CT ABDOMEN AND PELVIS WITH CONTRAST TECHNIQUE: Multidetector CT imaging of the abdomen and pelvis was performed using the standard protocol following bolus administration of intravenous contrast. CONTRAST:  141m ISOVUE-300 IOPAMIDOL (ISOVUE-300) INJECTION 61% COMPARISON:  Abdominal series [id number]:  Atelectasis or infiltration in the lung bases. Mild diffuse fatty infiltration of the liver. The gallbladder, pancreas, spleen, adrenal glands, abdominal aorta, inferior vena cava, and retroperitoneal lymph nodes are unremarkable. Multiple cysts in the upper pole left kidney. No hydronephrosis in either kidney. The stomach is decompressed. Dilated mostly fluid-filled small bowel with decompression of the terminal ileum. A transition zone appears to be in the right mid abdomen anteriorly of, or there is a tight kink. No mass is identified. Appearance likely due to adhesion. Small amount of free fluid in the mesenteries probably reactive. No bowel wall thickening or pneumatosis. No free air. Pelvis: The appendix is normal. Prostate gland is mildly prominent at 3.8 cm diameter. Bladder wall is not thickened. Scattered diverticula in the sigmoid colon without evidence of diverticulitis. Colon is mostly decompressed with scattered stool present. No free or loculated pelvic fluid collections. No pelvic mass or lymphadenopathy. Degenerative changes in the spine. No destructive bone lesions. IMPRESSION: Small bowel obstruction with transition zone in the right mid abdomen. No obstructing mass lesion identified. Changes likely due to adhesion. Mesenteric edema is likely reactive. Diffuse fatty infiltration of the liver. Electronically Signed   By: WLucienne CapersM.D.   On: 06/25/2015 04:05   Dg Abd Acute W/chest  06/25/2015  CLINICAL DATA:  60year old male with abdominal pain nausea and vomiting EXAM: DG ABDOMEN ACUTE W/ 1V CHEST COMPARISON:  None. FINDINGS: The lungs are clear. Left lung base linear/platelike atelectasis/ scarring. No focal consolidation, pleural effusion, or pneumothorax. The cardiac silhouette is within normal limits. Multiple dilated loops of small bowel with air-fluid levels noted most compatible with small bowel obstruction. Small amount of air noted mixed with stool in proximal colon. There is  no free air. No radiopaque calculi. There is degenerative changes of the spine. No acute fracture. IMPRESSION: Dilated loops of small bowel with air-fluid levels most compatible with small bowel obstruction. Follow-up recommended. Electronically Signed   By: AAnner CreteM.D.   On: 06/25/2015 02:22    Review of Systems  Constitutional: Negative for weight loss.  HENT: Negative for ear discharge, ear pain, hearing loss and tinnitus.   Eyes: Negative for blurred vision, double vision, photophobia and pain.  Respiratory: Negative for cough, sputum production and shortness of breath.   Cardiovascular: Negative for chest pain.  Gastrointestinal: Positive for nausea, vomiting, abdominal  pain and constipation.  Genitourinary: Negative for dysuria, urgency, frequency and flank pain.  Musculoskeletal: Negative for myalgias, back pain, joint pain, falls and neck pain.  Neurological: Positive for weakness. Negative for dizziness, tingling, sensory change, focal weakness, loss of consciousness and headaches.  Endo/Heme/Allergies: Does not bruise/bleed easily.  Psychiatric/Behavioral: Negative for depression, memory loss and substance abuse. The patient is not nervous/anxious.     Blood pressure 128/96, pulse 94, temperature 98 F (36.7 C), temperature source Oral, resp. rate 18, SpO2 96 %. Physical Exam  WDWN in NAD HEENT:  EOMI, sclera anicteric Neck:  No masses, no thyromegaly Lungs:  CTA bilaterally; normal respiratory effort CV:  Regular rate and rhythm; no murmurs Abd:  Hypoactive bowel sounds; distended; mild diffuse tenderness; no peritoneal signs Ext:  Well-perfused; no edema Skin:  Warm, dry; no sign of jaundice  Assessment/Plan Small bowel obstruction - appears to be a transition point in right abdomen, but no history of previous abdominal surgery. Hypokalemia Diabetes type 2  Will admit to hospital Place NG tube Initiate SBO protocol Sliding scale insulin q4 Maintenance IV  fluid with K  Recheck labs in AM.    Maia Petties., MD 06/25/2015, 5:24 AM

## 2015-06-25 NOTE — ED Notes (Signed)
Patient transported to CT 

## 2015-06-25 NOTE — Progress Notes (Signed)
Inpatient Diabetes Program Recommendations  AACE/ADA: New Consensus Statement on Inpatient Glycemic Control (2015)  Target Ranges:  Prepandial:   less than 140 mg/dL      Peak postprandial:   less than 180 mg/dL (1-2 hours)      Critically ill patients:  140 - 180 mg/dL   Review of Glycemic Control:  Results for Justin Yang, Justin D (MRN [id number]) as of 06/25/2015 12:41  Ref. Range 06/25/2015 07:27 06/25/2015 11:47  Glucose-Capillary Latest Ref Range: 65-99 mg/dL 409281 (H) 811203 (H)   Diabetes history: Type 2 diabetes Outpatient Diabetes medications: Metformin however unable to take recently due to nausea/vomitting Current orders for Inpatient glycemic control:  Novolog moderate q 4 hours.  Inpatient Diabetes Program Recommendations:    A1C pending.  While in the hospital, consider adding Lantus 15 units daily.  Thanks, Beryl MeagerJenny Tahlor Berenguer, RN, BC-ADM Inpatient Diabetes Coordinator Pager [phone] (8a-5p)

## 2015-06-25 NOTE — Progress Notes (Signed)
Spoke briefly with patient regarding diabetes management.  He states that he was told that he has "borderline" diabetes.  He states that he was on metformin but was not taking consistently.  He states that he was also on glipizide before but it was stopped.  Patient sees Dr. Dario AveKesler and states he has an appointment July 08, 2015.  Told him he needs follow-up with PCP regarding diabetes.  I will also order him the "Living Well with Diabetes" booklet.    Thanks, Beryl MeagerJenny Marshawn Ninneman, RN, BC-ADM Inpatient Diabetes Coordinator Pager [phone] (8a-5p)

## 2015-06-26 ENCOUNTER — Inpatient Hospital Stay (HOSPITAL_COMMUNITY): Payer: Non-veteran care

## 2015-06-26 LAB — CBC
HEMATOCRIT: 46.7 % (ref 39.0–52.0)
Hemoglobin: 15.7 g/dL (ref 13.0–17.0)
MCH: 32.1 pg (ref 26.0–34.0)
MCHC: 33.6 g/dL (ref 30.0–36.0)
MCV: 95.5 fL (ref 78.0–100.0)
PLATELETS: 167 10*3/uL (ref 150–400)
RBC: 4.89 MIL/uL (ref 4.22–5.81)
RDW: 12.8 % (ref 11.5–15.5)
WBC: 5.7 10*3/uL (ref 4.0–10.5)

## 2015-06-26 LAB — BASIC METABOLIC PANEL
Anion gap: 15 (ref 5–15)
BUN: 21 mg/dL — AB (ref 6–20)
CO2: 26 mmol/L (ref 22–32)
CREATININE: 1.48 mg/dL — AB (ref 0.61–1.24)
Calcium: 8.8 mg/dL — ABNORMAL LOW (ref 8.9–10.3)
Chloride: 99 mmol/L — ABNORMAL LOW (ref 101–111)
GFR, EST AFRICAN AMERICAN: 57 mL/min — AB (ref >60)
GFR, EST NON AFRICAN AMERICAN: 49 mL/min — AB (ref >60)
Glucose, Bld: 138 mg/dL — ABNORMAL HIGH (ref 65–99)
POTASSIUM: 4 mmol/L (ref 3.5–5.1)
SODIUM: 140 mmol/L (ref 135–145)

## 2015-06-26 LAB — HEMOGLOBIN A1C
HEMOGLOBIN A1C: 10 % — AB (ref 4.8–5.6)
Mean Plasma Glucose: 240 mg/dL

## 2015-06-26 LAB — GLUCOSE, CAPILLARY
GLUCOSE-CAPILLARY: 131 mg/dL — AB (ref 65–99)
GLUCOSE-CAPILLARY: 148 mg/dL — AB (ref 65–99)
Glucose-Capillary: 100 mg/dL — ABNORMAL HIGH (ref 65–99)
Glucose-Capillary: 108 mg/dL — ABNORMAL HIGH (ref 65–99)
Glucose-Capillary: 122 mg/dL — ABNORMAL HIGH (ref 65–99)
Glucose-Capillary: 144 mg/dL — ABNORMAL HIGH (ref 65–99)

## 2015-06-26 NOTE — Progress Notes (Signed)
Subjective: He feels much better. Passing flatus  Objective: Vital signs in last 24 hours: Temp:  [97.7 F (36.5 C)-98.5 F (36.9 C)] 98.5 F (36.9 C) (05/20 0423) Pulse Rate:  [83-96] 83 (05/20 0423) Resp:  [16-18] 16 (05/20 0423) BP: (103-125)/(68-90) 103/68 mmHg (05/20 0423) SpO2:  [97 %] 97 % (05/20 0423)    Intake/Output from previous day: 05/19 0701 - 05/20 0700 In: 2584.6 [P.O.:620; I.V.:1964.6] Out: 3450 [Urine:650; Emesis/NG output:2800] Intake/Output this shift: Total I/O In: 120 [P.O.:120] Out: 200 [Urine:200]  Resp: clear to auscultation bilaterally Cardio: regular rate and rhythm GI: soft, nontender. good bs  Lab Results:   Recent Labs  06/25/15 0721 06/26/15 0355  WBC 8.5 5.7  HGB 16.0 15.7  HCT 47.4 46.7  PLT 166 167   BMET  Recent Labs  06/25/15 0055 06/25/15 0721 06/26/15 0355  NA 132*  --  140  K 3.7  --  4.0  CL 89*  --  99*  CO2 28  --  26  GLUCOSE 345*  --  138*  BUN 20  --  21*  CREATININE 1.72* 1.61* 1.48*  CALCIUM 9.3  --  8.8*   PT/INR No results for input(s): LABPROT, INR in the last 72 hours. ABG No results for input(s): PHART, HCO3 in the last 72 hours.  Invalid input(s): PCO2, PO2  Studies/Results: Ct Abdomen Pelvis W Contrast  06/25/2015  CLINICAL DATA:  Mid abdominal pain with emesis, onset this week after taking laxatives for constipation. Pain is now all over the abdomen. EXAM: CT ABDOMEN AND PELVIS WITH CONTRAST TECHNIQUE: Multidetector CT imaging of the abdomen and pelvis was performed using the standard protocol following bolus administration of intravenous contrast. CONTRAST:  ISOVUE-300 IOPAMIDOL (ISOVUE-300) INJECTION 61% COMPARISON:  Abdominal series [id number] FINDINGS: Atelectasis or infiltration in the lung bases. Mild diffuse fatty infiltration of the liver. The gallbladder, pancreas, spleen, adrenal glands, abdominal aorta, inferior vena cava, and retroperitoneal lymph nodes are unremarkable. Multiple  cysts in the upper pole left kidney. No hydronephrosis in either kidney. The stomach is decompressed. Dilated mostly fluid-filled small bowel with decompression of the terminal ileum. A transition zone appears to be in the right mid abdomen anteriorly of, or there is a tight kink. No mass is identified. Appearance likely due to adhesion. Small amount of free fluid in the mesenteries probably reactive. No bowel wall thickening or pneumatosis. No free air. Pelvis: The appendix is normal. Prostate gland is mildly prominent at 3.8 cm diameter. Bladder wall is not thickened. Scattered diverticula in the sigmoid colon without evidence of diverticulitis. Colon is mostly decompressed with scattered stool present. No free or loculated pelvic fluid collections. No pelvic mass or lymphadenopathy. Degenerative changes in the spine. No destructive bone lesions. IMPRESSION: Small bowel obstruction with transition zone in the right mid abdomen. No obstructing mass lesion identified. Changes likely due to adhesion. Mesenteric edema is likely reactive. Diffuse fatty infiltration of the liver. Electronically Signed   By: Burman Nieves M.D.   On: 06/25/2015 04:05   Dg Abd Acute W/chest  06/25/2015  CLINICAL DATA:  60 year old male with abdominal pain nausea and vomiting EXAM: DG ABDOMEN ACUTE W/ 1V CHEST COMPARISON:  None. FINDINGS: The lungs are clear. Left lung base linear/platelike atelectasis/ scarring. No focal consolidation, pleural effusion, or pneumothorax. The cardiac silhouette is within normal limits. Multiple dilated loops of small bowel with air-fluid levels noted most compatible with small bowel obstruction. Small amount of air noted mixed with stool in proximal colon.  There is no free air. No radiopaque calculi. There is degenerative changes of the spine. No acute fracture. IMPRESSION: Dilated loops of small bowel with air-fluid levels most compatible with small bowel obstruction. Follow-up recommended.  Electronically Signed   By: Elgie CollardArash  Radparvar M.D.   On: 06/25/2015 02:22   Dg Abd Portable 1v  06/26/2015  CLINICAL DATA:  60 year old male with a history of abdominal pain EXAM: PORTABLE ABDOMEN - 1 VIEW COMPARISON:  Plain film 06/25/2015, CT 06/25/2015 FINDINGS: Single plain film the abdomen demonstrates persistent small bowel dilation with no progression of the gas pattern. Relative absence of colonic gas, with small rectal gas. Tip of gastric tube projects in the left upper quadrant. IMPRESSION: Evidence of persisting high-grade small bowel obstruction, with no progression of the gas pattern and a relative absence of colonic gas. Gastric tube projects in the left upper quadrant. Signed, Yvone NeuJaime S. Loreta AveWagner, DO [facility] Electronically Signed   By: Gilmer MorJaime  Wagner D.O.   On: 06/26/2015 09:21   Dg Abd Portable 1v-small Bowel Obstruction Protocol-initial, 8 Hr Delay  06/25/2015  CLINICAL DATA:  Small bowel obstruction 8 hour delay film EXAM: PORTABLE ABDOMEN - 1 VIEW COMPARISON:  06/25/2015 at 7: 47 FINDINGS: Persistent dilated small bowel loops consistent with small bowel obstruction. Paucity of bowel gas in distal colon. Residual contrast material within bladder. IMPRESSION: Persistent gaseous dilated small bowel loops consistent with small bowel obstruction. Electronically Signed   By: Natasha MeadLiviu  Pop M.D.   On: 06/25/2015 17:13   Dg Abd Portable 1v-small Bowel Protocol-position Verification  06/25/2015  CLINICAL DATA:  Nasogastric tube placement. EXAM: PORTABLE ABDOMEN - 1 VIEW COMPARISON:  CT 06/25/2015. FINDINGS: NG tube noted with tip below left hemidiaphragm. Persistent dilated loops of small bowel consistent small bowel obstruction noted. Paucity of gas noted within the colon. No free air. Contrast in the bladder from prior CT. IMPRESSION: 1. NG tube noted with tip in the stomach. 2. Persistent prominently dilated small bowel loops consistent small  bowel obstruction. Electronically Signed   By: Maisie Fushomas  Register   On: 06/25/2015 08:10    Anti-infectives: Anti-infectives    None      Assessment/Plan: s/p * No surgery found * Since abd xray looks unchanged I would keep ng to suction  Repeat abd xrays tomorrow  LOS: 1 day    TOTH III,Brookley Spitler S 06/26/2015

## 2015-06-27 ENCOUNTER — Inpatient Hospital Stay (HOSPITAL_COMMUNITY): Payer: Non-veteran care

## 2015-06-27 LAB — GLUCOSE, CAPILLARY
GLUCOSE-CAPILLARY: 102 mg/dL — AB (ref 65–99)
GLUCOSE-CAPILLARY: 113 mg/dL — AB (ref 65–99)
GLUCOSE-CAPILLARY: 117 mg/dL — AB (ref 65–99)
GLUCOSE-CAPILLARY: 133 mg/dL — AB (ref 65–99)
Glucose-Capillary: 100 mg/dL — ABNORMAL HIGH (ref 65–99)
Glucose-Capillary: 100 mg/dL — ABNORMAL HIGH (ref 65–99)
Glucose-Capillary: 104 mg/dL — ABNORMAL HIGH (ref 65–99)

## 2015-06-27 MED ORDER — DIATRIZOATE MEGLUMINE & SODIUM 66-10 % PO SOLN
ORAL | Status: AC
  Filled 2015-06-27: qty 90

## 2015-06-27 MED ORDER — DIATRIZOATE MEGLUMINE & SODIUM 66-10 % PO SOLN
90.0000 mL | Freq: Once | ORAL | Status: AC
  Administered 2015-06-27: 90 mL via NASOGASTRIC
  Filled 2015-06-27: qty 90

## 2015-06-27 NOTE — Progress Notes (Signed)
Pt was educated and voiced understanding about the consumption of ice chips was given oral moisturizing swabs to apply for mouth and lips. Will continue to monitor. Ilean SkillVeronica Daesha Insco LPN

## 2015-06-27 NOTE — Progress Notes (Signed)
  Subjective: No complaints. Feels better. Passing flatus. abd xrays unchanged  Objective: Vital signs in last 24 hours: Temp:  [98.2 F (36.8 C)-98.6 F (37 C)] 98.2 F (36.8 C) (05/21 0527) Pulse Rate:  [70-78] 70 (05/21 0527) Resp:  [19] 19 (05/21 0527) BP: (108-110)/(66-73) 108/66 mmHg (05/21 0527) SpO2:  [97 %-100 %] 100 % (05/21 0527) Last BM Date: 06/21/15  Intake/Output from previous day: 05/20 0701 - 05/21 0700 In: 980 [P.O.:260] Out: 4100 [Urine:1100; Emesis/NG output:3000] Intake/Output this shift:    Resp: clear to auscultation bilaterally Cardio: regular rate and rhythm GI: soft, nontender. minimal distension  Lab Results:   Recent Labs  06/25/15 0721 06/26/15 0355  WBC 8.5 5.7  HGB 16.0 15.7  HCT 47.4 46.7  PLT 166 167   BMET  Recent Labs  06/25/15 0055 06/25/15 0721 06/26/15 0355  NA 132*  --  140  K 3.7  --  4.0  CL 89*  --  99*  CO2 28  --  26  GLUCOSE 345*  --  138*  BUN 20  --  21*  CREATININE 1.72* 1.61* 1.48*  CALCIUM 9.3  --  8.8*   PT/INR No results for input(s): LABPROT, INR in the last 72 hours. ABG No results for input(s): PHART, HCO3 in the last 72 hours.  Invalid input(s): PCO2, PO2  Studies/Results: Dg Abd Portable 1v  06/26/2015  CLINICAL DATA:  60 year old male with a history of abdominal pain EXAM: PORTABLE ABDOMEN - 1 VIEW COMPARISON:  Plain film 06/25/2015, CT 06/25/2015 FINDINGS: Single plain film the abdomen demonstrates persistent small bowel dilation with no progression of the gas pattern. Relative absence of colonic gas, with small rectal gas. Tip of gastric tube projects in the left upper quadrant. IMPRESSION: Evidence of persisting high-grade small bowel obstruction, with no progression of the gas pattern and a relative absence of colonic gas. Gastric tube projects in the left upper quadrant. Signed, Yvone NeuJaime S. Loreta AveWagner, DO [facility] Electronically Signed    By: Gilmer MorJaime  Wagner D.O.   On: 06/26/2015 09:21   Dg Abd Portable 1v-small Bowel Obstruction Protocol-initial, 8 Hr Delay  06/25/2015  CLINICAL DATA:  Small bowel obstruction 8 hour delay film EXAM: PORTABLE ABDOMEN - 1 VIEW COMPARISON:  06/25/2015 at 7: 47 FINDINGS: Persistent dilated small bowel loops consistent with small bowel obstruction. Paucity of bowel gas in distal colon. Residual contrast material within bladder. IMPRESSION: Persistent gaseous dilated small bowel loops consistent with small bowel obstruction. Electronically Signed   By: Natasha MeadLiviu  Pop M.D.   On: 06/25/2015 17:13    Anti-infectives: Anti-infectives    None      Assessment/Plan: s/p * No surgery found * will start small bowel protocol  Continue ng and bowel rest ambulate  LOS: 2 days    TOTH III,Desiraye Rolfson S 06/27/2015

## 2015-06-28 ENCOUNTER — Encounter (HOSPITAL_COMMUNITY): Payer: Self-pay | Admitting: Certified Registered Nurse Anesthetist

## 2015-06-28 ENCOUNTER — Encounter (HOSPITAL_COMMUNITY): Admission: EM | Disposition: A | Discharge status: Home / Self Care | Payer: Self-pay | Source: Home / Self Care

## 2015-06-28 ENCOUNTER — Inpatient Hospital Stay (HOSPITAL_COMMUNITY): Payer: Non-veteran care | Admitting: Certified Registered Nurse Anesthetist

## 2015-06-28 HISTORY — PX: LAPAROSCOPY: SHX197

## 2015-06-28 HISTORY — PX: LAPAROTOMY: SHX154

## 2015-06-28 LAB — CBC
HEMATOCRIT: 39.1 % (ref 39.0–52.0)
Hemoglobin: 12.6 g/dL — ABNORMAL LOW (ref 13.0–17.0)
MCH: 31.2 pg (ref 26.0–34.0)
MCHC: 32.2 g/dL (ref 30.0–36.0)
MCV: 96.8 fL (ref 78.0–100.0)
Platelets: 148 10*3/uL — ABNORMAL LOW (ref 150–400)
RBC: 4.04 MIL/uL — AB (ref 4.22–5.81)
RDW: 12.6 % (ref 11.5–15.5)
WBC: 5.5 10*3/uL (ref 4.0–10.5)

## 2015-06-28 LAB — GLUCOSE, CAPILLARY
GLUCOSE-CAPILLARY: 92 mg/dL (ref 65–99)
GLUCOSE-CAPILLARY: 90 mg/dL (ref 65–99)
GLUCOSE-CAPILLARY: 87 mg/dL (ref 65–99)
GLUCOSE-CAPILLARY: 138 mg/dL — AB (ref 65–99)
Glucose-Capillary: 125 mg/dL — ABNORMAL HIGH (ref 65–99)
Glucose-Capillary: 120 mg/dL — ABNORMAL HIGH (ref 65–99)

## 2015-06-28 LAB — BASIC METABOLIC PANEL
ANION GAP: 9 (ref 5–15)
BUN: 14 mg/dL (ref 6–20)
CHLORIDE: 109 mmol/L (ref 101–111)
CO2: 21 mmol/L — AB (ref 22–32)
Calcium: 8.6 mg/dL — ABNORMAL LOW (ref 8.9–10.3)
Creatinine, Ser: 1.37 mg/dL — ABNORMAL HIGH (ref 0.61–1.24)
GFR calc Af Amer: >60 mL/min (ref >60)
GFR calc non Af Amer: 54 mL/min — ABNORMAL LOW (ref >60)
GLUCOSE: 90 mg/dL (ref 65–99)
POTASSIUM: 4.1 mmol/L (ref 3.5–5.1)
Sodium: 139 mmol/L (ref 135–145)

## 2015-06-28 LAB — SURGICAL PCR SCREEN
MRSA, PCR: NEGATIVE
Staphylococcus aureus: POSITIVE — AB

## 2015-06-28 SURGERY — LAPAROSCOPY, DIAGNOSTIC
Anesthesia: General | Site: Abdomen

## 2015-06-28 MED ORDER — FENTANYL CITRATE (PF) 250 MCG/5ML IJ SOLN
INTRAMUSCULAR | Status: DC | PRN
  Administered 2015-06-28: 50 ug via INTRAVENOUS
  Administered 2015-06-28: 100 ug via INTRAVENOUS
  Administered 2015-06-28 (×2): 50 ug via INTRAVENOUS
  Administered 2015-06-28: 25 ug via INTRAVENOUS
  Administered 2015-06-28: 50 ug via INTRAVENOUS
  Administered 2015-06-28: 100 ug via INTRAVENOUS
  Administered 2015-06-28: 25 ug via INTRAVENOUS

## 2015-06-28 MED ORDER — HYDROMORPHONE 1 MG/ML IV SOLN
INTRAVENOUS | Status: AC
  Filled 2015-06-28: qty 25

## 2015-06-28 MED ORDER — CEFAZOLIN SODIUM-DEXTROSE 2-3 GM-% IV SOLR
INTRAVENOUS | Status: DC | PRN
  Administered 2015-06-28: 2 g via INTRAVENOUS

## 2015-06-28 MED ORDER — PROPOFOL 10 MG/ML IV BOLUS
INTRAVENOUS | Status: DC | PRN
  Administered 2015-06-28: 200 mg via INTRAVENOUS

## 2015-06-28 MED ORDER — LACTATED RINGERS IV SOLN
INTRAVENOUS | Status: DC
  Administered 2015-06-28 (×2): via INTRAVENOUS

## 2015-06-28 MED ORDER — LIDOCAINE HCL (CARDIAC) 20 MG/ML IV SOLN
INTRAVENOUS | Status: DC | PRN
  Administered 2015-06-28: 100 mg via INTRAVENOUS

## 2015-06-28 MED ORDER — FENTANYL CITRATE (PF) 250 MCG/5ML IJ SOLN
INTRAMUSCULAR | Status: AC
  Filled 2015-06-28: qty 5

## 2015-06-28 MED ORDER — ONDANSETRON HCL 4 MG/2ML IJ SOLN
INTRAMUSCULAR | Status: DC | PRN
  Administered 2015-06-28: 4 mg via INTRAVENOUS

## 2015-06-28 MED ORDER — POTASSIUM CHLORIDE IN NACL 20-0.9 MEQ/L-% IV SOLN
INTRAVENOUS | Status: DC
Start: 2015-06-28 — End: 2015-06-30
  Administered 2015-06-29 (×2): via INTRAVENOUS
  Filled 2015-06-28 (×2): qty 1000

## 2015-06-28 MED ORDER — MIDAZOLAM HCL 5 MG/5ML IJ SOLN
INTRAMUSCULAR | Status: DC | PRN
  Administered 2015-06-28: 2 mg via INTRAVENOUS

## 2015-06-28 MED ORDER — HYDROMORPHONE HCL 1 MG/ML IJ SOLN
0.2500 mg | INTRAMUSCULAR | Status: DC | PRN
  Administered 2015-06-28 (×4): 0.5 mg via INTRAVENOUS

## 2015-06-28 MED ORDER — BUPIVACAINE-EPINEPHRINE 0.25% -1:200000 IJ SOLN
INTRAMUSCULAR | Status: DC | PRN
  Administered 2015-06-28: 20 mL

## 2015-06-28 MED ORDER — HYDROMORPHONE 1 MG/ML IV SOLN
INTRAVENOUS | Status: DC
Start: 2015-06-28 — End: 2015-07-05
  Administered 2015-06-28: 0.3 mg via INTRAVENOUS
  Administered 2015-06-28: 3 mg via INTRAVENOUS
  Administered 2015-06-29: 2.4 mg via INTRAVENOUS
  Administered 2015-06-29: 1.2 mg via INTRAVENOUS
  Administered 2015-06-29: 2.1 mg via INTRAVENOUS
  Administered 2015-06-29: 1.5 mg via INTRAVENOUS
  Administered 2015-06-29: 2.4 mg via INTRAVENOUS
  Administered 2015-06-29: 1.5 mg via INTRAVENOUS
  Administered 2015-06-30: 3.3 mg via INTRAVENOUS
  Administered 2015-06-30: 3 mg via INTRAVENOUS
  Administered 2015-06-30: 2.4 mg via INTRAVENOUS
  Administered 2015-06-30 (×2): 2.1 mg via INTRAVENOUS
  Administered 2015-06-30: 3 mg via INTRAVENOUS
  Administered 2015-07-01: 23:00:00 via INTRAVENOUS
  Administered 2015-07-01: 0.9 mg via INTRAVENOUS
  Administered 2015-07-01: 3.3 mg via INTRAVENOUS
  Administered 2015-07-01: 3 mg via INTRAVENOUS
  Administered 2015-07-01: 2.1 mg via INTRAVENOUS
  Administered 2015-07-01: 3.6 mg via INTRAVENOUS
  Administered 2015-07-01 – 2015-07-02 (×2): 0.9 mg via INTRAVENOUS
  Administered 2015-07-02: 1.8 mg via INTRAVENOUS
  Administered 2015-07-02: 2.7 mg via INTRAVENOUS
  Administered 2015-07-02: 3.3 mg via INTRAVENOUS
  Administered 2015-07-02: 2.4 mg via INTRAVENOUS
  Administered 2015-07-03: 2.7 mg via INTRAVENOUS
  Administered 2015-07-03 (×2): 3 mg via INTRAVENOUS
  Administered 2015-07-03: 1.8 mg via INTRAVENOUS
  Administered 2015-07-03: 1.7 mg via INTRAVENOUS
  Administered 2015-07-03: 2.7 mg via INTRAVENOUS
  Administered 2015-07-03: 15:00:00 via INTRAVENOUS
  Administered 2015-07-04: 1.8 mg via INTRAVENOUS
  Administered 2015-07-04: 0.9 mg via INTRAVENOUS
  Administered 2015-07-04: 0.6 mg via INTRAVENOUS
  Administered 2015-07-04: 0.9 mg via INTRAVENOUS
  Administered 2015-07-04: 1.2 mg via INTRAVENOUS
  Administered 2015-07-04: 0.3 mg via INTRAVENOUS
  Administered 2015-07-05 (×3): 0.6 mg via INTRAVENOUS
  Filled 2015-06-28 (×3): qty 25

## 2015-06-28 MED ORDER — ONDANSETRON HCL 4 MG/2ML IJ SOLN: 4.0000 mg | Freq: Once | INTRAMUSCULAR | Status: DC | PRN

## 2015-06-28 MED ORDER — BUPIVACAINE-EPINEPHRINE (PF) 0.25% -1:200000 IJ SOLN
INTRAMUSCULAR | Status: AC
  Filled 2015-06-28: qty 30

## 2015-06-28 MED ORDER — SUCCINYLCHOLINE CHLORIDE 20 MG/ML IJ SOLN
INTRAMUSCULAR | Status: DC | PRN
  Administered 2015-06-28: 80 mg via INTRAVENOUS

## 2015-06-28 MED ORDER — ROCURONIUM BROMIDE 50 MG/5ML IV SOLN
INTRAVENOUS | Status: AC
  Filled 2015-06-28: qty 1

## 2015-06-28 MED ORDER — NALOXONE HCL 0.4 MG/ML IJ SOLN: 0.4000 mg | INTRAMUSCULAR | Status: DC | PRN

## 2015-06-28 MED ORDER — PROPOFOL 10 MG/ML IV BOLUS
INTRAVENOUS | Status: AC
  Filled 2015-06-28: qty 20

## 2015-06-28 MED ORDER — 0.9 % SODIUM CHLORIDE (POUR BTL) OPTIME
TOPICAL | Status: DC | PRN
  Administered 2015-06-28 (×2): 1000 mL

## 2015-06-28 MED ORDER — LIDOCAINE 2% (20 MG/ML) 5 ML SYRINGE
INTRAMUSCULAR | Status: AC
  Filled 2015-06-28: qty 5

## 2015-06-28 MED ORDER — DEXTROSE 5 % IV SOLN
INTRAVENOUS | Status: DC
  Administered 2015-06-28: 18:00:00 via INTRAVENOUS

## 2015-06-28 MED ORDER — DEXTROSE 5 % IV SOLN
2.0000 g | Freq: Two times a day (BID) | INTRAVENOUS | Status: DC
  Administered 2015-06-28: 2 g via INTRAVENOUS
  Filled 2015-06-28 (×2): qty 2

## 2015-06-28 MED ORDER — HYDROMORPHONE HCL 1 MG/ML IJ SOLN
INTRAMUSCULAR | Status: AC
  Filled 2015-06-28: qty 1

## 2015-06-28 MED ORDER — MIDAZOLAM HCL 2 MG/2ML IJ SOLN
INTRAMUSCULAR | Status: AC
  Filled 2015-06-28: qty 2

## 2015-06-28 MED ORDER — SUGAMMADEX SODIUM 200 MG/2ML IV SOLN
INTRAVENOUS | Status: DC | PRN
  Administered 2015-06-28: 200 mg via INTRAVENOUS

## 2015-06-28 MED ORDER — ROCURONIUM BROMIDE 100 MG/10ML IV SOLN
INTRAVENOUS | Status: DC | PRN
  Administered 2015-06-28: 10 mg via INTRAVENOUS
  Administered 2015-06-28: 30 mg via INTRAVENOUS

## 2015-06-28 MED ORDER — SODIUM CHLORIDE 0.9% FLUSH: 9.0000 mL | INTRAVENOUS | Status: DC | PRN

## 2015-06-28 MED ORDER — SODIUM CHLORIDE 0.9 % IR SOLN
Status: DC | PRN
  Administered 2015-06-28: 1000 mL

## 2015-06-28 MED ORDER — CETYLPYRIDINIUM CHLORIDE 0.05 % MT LIQD: 7.0000 mL | OROMUCOSAL | Status: DC | PRN

## 2015-06-28 SURGICAL SUPPLY — 57 items
APPLIER CLIP 5 13 M/L LIGAMAX5 (MISCELLANEOUS)
BLADE SURG 10 STRL SS (BLADE) ×3 IMPLANT
BLADE SURG ROTATE 9660 (MISCELLANEOUS) IMPLANT
CANISTER SUCTION 2500CC (MISCELLANEOUS) ×3 IMPLANT
CHLORAPREP W/TINT 26ML (MISCELLANEOUS) ×3 IMPLANT
CLIP APPLIE 5 13 M/L LIGAMAX5 (MISCELLANEOUS) IMPLANT
COVER SURGICAL LIGHT HANDLE (MISCELLANEOUS) ×3 IMPLANT
DECANTER SPIKE VIAL GLASS SM (MISCELLANEOUS) ×3 IMPLANT
DRAPE C-ARM 42X72 X-RAY (DRAPES) IMPLANT
DRAPE LAPAROSCOPIC ABDOMINAL (DRAPES) ×3 IMPLANT
DRAPE WARM FLUID 44X44 (DRAPE) ×3 IMPLANT
DRSG OPSITE POSTOP 4X10 (GAUZE/BANDAGES/DRESSINGS) IMPLANT
DRSG OPSITE POSTOP 4X8 (GAUZE/BANDAGES/DRESSINGS) ×3 IMPLANT
ELECT BLADE 6.5 EXT (BLADE) IMPLANT
ELECT CAUTERY BLADE 6.4 (BLADE) ×3 IMPLANT
ELECT REM PT RETURN 9FT ADLT (ELECTROSURGICAL) ×3
ELECTRODE REM PT RTRN 9FT ADLT (ELECTROSURGICAL) ×1 IMPLANT
GLOVE BIOGEL PI IND STRL 6.5 (GLOVE) ×1 IMPLANT
GLOVE BIOGEL PI INDICATOR 6.5 (GLOVE) ×2
GLOVE SURG SIGNA 7.5 PF LTX (GLOVE) ×3 IMPLANT
GLOVE SURG SS PI 6.5 STRL IVOR (GLOVE) ×3 IMPLANT
GOWN STRL REUS W/ TWL LRG LVL3 (GOWN DISPOSABLE) ×2 IMPLANT
GOWN STRL REUS W/ TWL XL LVL3 (GOWN DISPOSABLE) ×1 IMPLANT
GOWN STRL REUS W/TWL LRG LVL3 (GOWN DISPOSABLE) ×4
GOWN STRL REUS W/TWL XL LVL3 (GOWN DISPOSABLE) ×2
KIT BASIN OR (CUSTOM PROCEDURE TRAY) ×3 IMPLANT
KIT ROOM TURNOVER OR (KITS) ×3 IMPLANT
LIGASURE IMPACT 36 18CM CVD LR (INSTRUMENTS) IMPLANT
LIQUID BAND (GAUZE/BANDAGES/DRESSINGS) ×3 IMPLANT
NS IRRIG 1000ML POUR BTL (IV SOLUTION) ×6 IMPLANT
PACK GENERAL/GYN (CUSTOM PROCEDURE TRAY) ×3 IMPLANT
PAD ARMBOARD 7.5X6 YLW CONV (MISCELLANEOUS) ×3 IMPLANT
PENCIL BUTTON HOLSTER BLD 10FT (ELECTRODE) ×3 IMPLANT
SCISSORS LAP 5X35 DISP (ENDOMECHANICALS) ×3 IMPLANT
SET IRRIG TUBING LAPAROSCOPIC (IRRIGATION / IRRIGATOR) IMPLANT
SLEEVE ENDOPATH XCEL 5M (ENDOMECHANICALS) ×6 IMPLANT
SPECIMEN JAR LARGE (MISCELLANEOUS) IMPLANT
SPONGE LAP 18X18 X RAY DECT (DISPOSABLE) IMPLANT
STAPLER VISISTAT 35W (STAPLE) ×3 IMPLANT
SUCTION POOLE TIP (SUCTIONS) ×3 IMPLANT
SUT MON AB 4-0 PC3 18 (SUTURE) ×3 IMPLANT
SUT PDS AB 1 TP1 96 (SUTURE) ×6 IMPLANT
SUT SILK 2 0 SH CR/8 (SUTURE) ×3 IMPLANT
SUT SILK 2 0 TIES 10X30 (SUTURE) ×3 IMPLANT
SUT SILK 3 0 SH CR/8 (SUTURE) ×3 IMPLANT
SUT SILK 3 0 TIES 10X30 (SUTURE) ×3 IMPLANT
SUT VIC AB 3-0 SH 18 (SUTURE) IMPLANT
TOWEL OR 17X24 6PK STRL BLUE (TOWEL DISPOSABLE) ×3 IMPLANT
TOWEL OR 17X26 10 PK STRL BLUE (TOWEL DISPOSABLE) ×3 IMPLANT
TRAY FOLEY CATH 16FRSI W/METER (SET/KITS/TRAYS/PACK) ×3 IMPLANT
TRAY LAPAROSCOPIC MC (CUSTOM PROCEDURE TRAY) ×3 IMPLANT
TROCAR XCEL NON-BLD 11X100MML (ENDOMECHANICALS) IMPLANT
TROCAR XCEL NON-BLD 5MMX100MML (ENDOMECHANICALS) ×3 IMPLANT
TUBE CONNECTING 12'X1/4 (SUCTIONS) ×1
TUBE CONNECTING 12X1/4 (SUCTIONS) ×2 IMPLANT
TUBING INSUFFLATION (TUBING) ×3 IMPLANT
YANKAUER SUCT BULB TIP NO VENT (SUCTIONS) ×3 IMPLANT

## 2015-06-28 NOTE — Anesthesia Procedure Notes (Signed)
Procedure Name: Intubation Date/Time: 06/28/2015 12:00 PM Performed by: Sarita HaverFLOWERS, Charmian Forbis T Pre-anesthesia Checklist: Patient identified, Timeout performed, Emergency Drugs available, Suction available and Patient being monitored Patient Re-evaluated:Patient Re-evaluated prior to inductionOxygen Delivery Method: Circle system utilized and Simple face mask Preoxygenation: Pre-oxygenation with 100% oxygen Intubation Type: IV induction, Rapid sequence and Cricoid Pressure applied Laryngoscope Size: Glidescope and 4 Grade View: Grade I Tube type: Oral Tube size: 7.5 mm Number of attempts: 1 Airway Equipment and Method: Patient positioned with wedge pillow,  Stylet and Video-laryngoscopy Placement Confirmation: ETT inserted through vocal cords under direct vision,  positive ETCO2 and breath sounds checked- equal and bilateral Secured at: 22 cm Tube secured with: Tape Dental Injury: Teeth and Oropharynx as per pre-operative assessment  Comments: glidescope used not for difficult airway, but to visualize posterior oropharynx as pt c/o irritation w NG placed in ED. Nothing noted

## 2015-06-28 NOTE — Anesthesia Postprocedure Evaluation (Signed)
Anesthesia Post Note  Patient: Justin Yang  Procedure(s) Performed: Procedure(s) (LRB): LAPAROSCOPY DIAGNOSTIC (N/A) LYSIS OF ADHESIONS (N/A)  Patient location during evaluation: PACU Anesthesia Type: General Level of consciousness: awake and alert Pain management: pain level controlled Vital Signs Assessment: post-procedure vital signs reviewed and stable Respiratory status: spontaneous breathing, nonlabored ventilation, respiratory function stable and patient connected to nasal cannula oxygen Cardiovascular status: blood pressure returned to baseline and stable Postop Assessment: no signs of nausea or vomiting Anesthetic complications: no    Last Vitals:  Filed Vitals:   06/28/15 1441 06/28/15 1445  BP: 158/94   Pulse: 102 100  Temp:  36.8 C  Resp: 25 12    Last Pain:  Filed Vitals:   06/28/15 1453  PainSc: Asleep                 Shelton SilvasKevin D Hollis

## 2015-06-28 NOTE — Progress Notes (Signed)
[name], male   DOB: 05-01-1955, 60 y.o.   MRN: [id number]     [facility]      [address]   [place], [place] [address]    Phone: [phone] FAX: [phone]     Subjective: 4L ngt output, taking in lots of ice chips.  Passing flatus. No BM since last Monday.  Little PO intake, gatorade and a banana on Thursday. VSS. CBGs are good.  Ambulating in hallways.   Objective:  Vital signs:  Filed Vitals:   06/27/15 0527 06/27/15 1324 06/27/15 2006 06/28/15 0359  BP: 108/66 133/73 123/81 119/70  Pulse: 70 66 71 63  Temp: 98.2 F (36.8 C) 97.9 F (36.6 C) 98.7 F (37.1 C) 98.4 F (36.9 C)  TempSrc: Oral Oral Oral Oral  Resp: Height:      Weight:      SpO2: 100% 100% 100% 100%    Last BM Date: 06/21/15  Intake/Output   Yesterday:  05/21 0701 - 05/22 0700 In: 1260 [P.O.:360; I.V.:900] Out: 4250 [Urine:200; Emesis/NG output:4050] This shift: I/O last 3 completed shifts: In: 1980 [P.O.:360; I.V.:900; Other:720] Out: 5650 [Urine:700; Emesis/NG output:4950]   Physical Exam: General: Pt awake/alert/oriented x4 in no acute distress  Abdomen: Soft.  Nondistended. Non tender.   No evidence of peritonitis.  No incarcerated hernias.    Problem List:   Active Problems:   Small bowel obstruction (HCC)    Results:   Labs: Results for orders placed or performed during the hospital encounter of 06/25/15 (from the past 48 hour(s))  Glucose, capillary     Status: Abnormal   Collection Time: 06/26/15 12:09 PM  Result Value Ref Range   Glucose-Capillary 144 (H) 65 - 99 mg/dL  Glucose, capillary     Status: Abnormal   Collection Time: 06/26/15  4:22 PM  Result Value Ref Range   Glucose-Capillary 108 (H) 65 - 99 mg/dL  Glucose, capillary     Status: Abnormal   Collection Time: 06/26/15  8:08 PM  Result Value Ref Range   Glucose-Capillary 100 (H) 65 - 99 mg/dL  Glucose, capillary     Status:  Abnormal   Collection Time: 06/27/15 12:16 AM  Result Value Ref Range   Glucose-Capillary 133 (H) 65 - 99 mg/dL  Glucose, capillary     Status: Abnormal   Collection Time: 06/27/15  3:40 AM  Result Value Ref Range   Glucose-Capillary 113 (H) 65 - 99 mg/dL   Comment 1 Notify RN   Glucose, capillary     Status: Abnormal   Collection Time: 06/27/15  7:47 AM  Result Value Ref Range   Glucose-Capillary 117 (H) 65 - 99 mg/dL  Glucose, capillary     Status: Abnormal   Collection Time: 06/27/15 11:56 AM  Result Value Ref Range   Glucose-Capillary 102 (H) 65 - 99 mg/dL  Glucose, capillary     Status: Abnormal   Collection Time: 06/27/15  4:37 PM  Result Value Ref Range   Glucose-Capillary 104 (H) 65 - 99 mg/dL  Glucose, capillary     Status: Abnormal   Collection Time: 06/27/15  8:05 PM  Result Value Ref Range   Glucose-Capillary 100 (H) 65 - 99 mg/dL  Glucose, capillary     Status: Abnormal   Collection Time: 06/27/15 11:52 PM  Result Value Ref Range   Glucose-Capillary 100 (H) 65 - 99 mg/dL  Glucose, capillary     Status:  None   Collection Time: 06/28/15  3:58 AM  Result Value Ref Range   Glucose-Capillary 92 65 - 99 mg/dL  Glucose, capillary     Status: None   Collection Time: 06/28/15  7:31 AM  Result Value Ref Range   Glucose-Capillary 90 65 - 99 mg/dL    Imaging / Studies: Dg Abd 2 Views  06/27/2015  CLINICAL DATA:  Followup small bowel obstruction. EXAM: ABDOMEN - 2 VIEW COMPARISON:  06/26/2015, 06/25/2015. FINDINGS: No significant change in the markedly dilated loops of small bowel throughout the upper abdomen demonstrating air-fluid levels on the erect image. Gas and fluid within normal caliber distal small bowel. Expected stool burden throughout normal caliber colon. No free intraperitoneal air. Nasogastric tube tip projects over the body of the stomach. Phleboliths low in the right side of the pelvis. No visible opaque urinary tract calculi. IMPRESSION: No significant  change in the high-grade partial small bowel obstruction since 2 days ago. No free intraperitoneal air. Electronically Signed   By: Hulan Saashomas  Lawrence M.D.   On: 06/27/2015 11:11   Dg Abd Portable 1v-small Bowel Obstruction Protocol-initial, 8 Hr Delay  06/28/2015  CLINICAL DATA:  8 hours status post administration of oral contrast. Abdominal distention. Initial encounter. EXAM: PORTABLE ABDOMEN - 1 VIEW COMPARISON:  Abdominal radiograph performed earlier today at 7:09 a.m. FINDINGS: There is dilatation of small-bowel loops to 5.1 cm in maximal diameter, compatible with persistent small bowel obstruction. Administered oral contrast is not well characterized, likely due to dilution. No free intra-abdominal air is seen, though evaluation for free air is limited on a single supine view. No acute osseous abnormalities are identified. An enteric tube is noted ending overlying the body of the stomach. IMPRESSION: Dilatation of small-bowel loops to 5.1 cm in maximal diameter, compatible with persistent high-grade small bowel obstruction. Administered oral contrast is not well characterized, likely due to dilution. No free intra-abdominal air seen. Electronically Signed   By: Roanna RaiderJeffery  Chang M.D.   On: 06/28/2015 00:19   Dg Abd Portable 1v  06/26/2015  CLINICAL DATA:  60 year old male with a history of abdominal pain EXAM: PORTABLE ABDOMEN - 1 VIEW COMPARISON:  Plain film 06/25/2015, CT 06/25/2015 FINDINGS: Single plain film the abdomen demonstrates persistent small bowel dilation with no progression of the gas pattern. Relative absence of colonic gas, with small rectal gas. Tip of gastric tube projects in the left upper quadrant. IMPRESSION: Evidence of persisting high-grade small bowel obstruction, with no progression of the gas pattern and a relative absence of colonic gas. Gastric tube projects in the left upper quadrant. Signed, Yvone NeuJaime S. Loreta AveWagner, DO [facility] Electronically Signed   By: Gilmer MorJaime  Wagner D.O.   On: 06/26/2015 09:21    Medications / Allergies:  Scheduled Meds: . enoxaparin (LOVENOX) injection  40 mg Subcutaneous Q24H  . insulin aspart  0-15 Units Subcutaneous Q4H  . pantoprazole (PROTONIX) IV  40 mg Intravenous QHS   Continuous Infusions: . 0.9 % NaCl with KCl 20 mEq / L 1,000 mL (06/28/15 0256)   PRN Meds:.diphenhydrAMINE **OR** diphenhydrAMINE, morphine injection, ondansetron **OR** ondansetron (ZOFRAN) IV, phenol  Antibiotics: Anti-infectives    None        Assessment/Plan SBO-no previous surgeries.  SBO protocol 5/19 and 5/21.  No progression of oral contrast, 4L NGT output(drinking lots of ice chips).  At this point, I think he would benefit from a laparotomy.  Will discuss with Dr. Teena IraniBlackman FEN-NPO.  Minimal PO intake for  1 week now.  Check bmp and cbc.  VTE prophylaxis-SCD, lovenox DM II-SSI, CBGs Dispo-sbo   Ashok Norris, ANP-BC [facility] Pager [phone](7A-4:30P) For consults and floor pages call [phone](7A-4:30P)  06/28/2015 8:26 AM

## 2015-06-28 NOTE — Progress Notes (Signed)
Pt returned from pacu this evening on a full dose dilaudid pca pump. NGT still in place and connected to low wall suction as previously ordered.

## 2015-06-28 NOTE — Op Note (Signed)
LAPAROSCOPY DIAGNOSTIC, LYSIS OF ADHESIONS  Procedure Note  Sallyanne Haversllis D Debellis 06/25/2015 - 06/28/2015   Pre-op Diagnosis: SMALL BOWEL OBSTRUCTION     Post-op Diagnosis: same  Procedure(s): LAPAROSCOPY DIAGNOSTIC EXPLORATORY LAPAROTOMY LYSIS OF ADHESIONS  Surgeon(s): Abigail Miyamotoouglas Shalae Belmonte, MD  Anesthesia: General  Staff:  Circulator: Woodroe ModeKelly A Hickling, RN Relief Circulator: Tanda Rockerserry Wagoner, RN Relief Scrub: Delorse LimberAngelia F Banks, CST Scrub Person: Guy SandiferJames E Barrett; Christella ScheuermannMarie H Irwin, RN  Estimated Blood Loss: Minimal               FINDINGS: single large band of omentum creating a small bowel obstruction          Ottie Tillery A   Date: 06/28/2015  Time: 12:55 PM

## 2015-06-28 NOTE — Progress Notes (Signed)
Initial Nutrition Assessment  DOCUMENTATION CODES:   Not applicable  INTERVENTION:   Advance diet as medically appropriate, add supplements when/as able  NUTRITION DIAGNOSIS:   Inadequate oral intake related to inability to eat as evidenced by NPO status  GOAL:   Patient will meet greater than or equal to 90% of their needs  MONITOR:   Diet advancement, PO intake, Labs, Weight trends, I & O's  REASON FOR ASSESSMENT:   Malnutrition Screening Tool  ASSESSMENT:   60 yo Male with a history of diabetes type 2 who presents with a four day history of progressive abdominal distention, abdominal pain, nausea, and vomiting. He has had very poor PO intake and decreased urination. Last BM was several days ago. He has attempted to use laxatives without any improvement. He has been unable to take his Metformin. No previous abdominal surgery. He presented to the ED for evaluation and was found to have findings consistent with an SBO.  Patient s/p procedures 5/22: LAPAROSCOPY DIAGNOSTIC EXPLORATORY LAPAROTOMY LYSIS OF ADHESIONS  Patient currently in PACU >> unable to obtain nutrition hx. Pt has been NPO since admission >> pre-op dx is SBO. Per Malnutrition Screening Tool Report, pt with decreased appetite and recent weight loss PTA. Unable to complete Nutrition-Focused physical exam at this time. RD to monitor for PO diet advancement >> add interventions accordingly.  Diet Order:  Diet NPO time specified Except for: Ice Chips  Skin:  Reviewed, no issues  Last BM:  5/15  Height:   Ht Readings from Last 1 Encounters:  06/25/15 5\' 11"  (1.803 m)    Weight:   Wt Readings from Last 1 Encounters:  06/25/15 188 lb (85.276 kg)    Ideal Body Weight:  78.1 kg  BMI:  Body mass index is 26.23 kg/(m^2).  Estimated Nutritional Needs:   Kcal:  2100-2300  Protein:  110-120 gm  Fluid:  2.1-2.3 L  EDUCATION NEEDS:   No education needs identified at this time  Maureen ChattersKatie  Damaya Channing, RD, LDN Pager #: [phone] After-Hours Pager #: [phone]

## 2015-06-28 NOTE — Progress Notes (Signed)
PARENTERAL NUTRITION CONSULT NOTE - INITIAL  Pharmacy Consult:  TPN Indication:  Prolonged ileus s/p LOA due to SBO  No Known Allergies  Patient Measurements: Height: 5\' 11"  (180.3 cm) Weight: 188 lb (85.276 kg) IBW/kg (Calculated) : 75.3  Vital Signs: Temp: 98.2 F (36.8 C) (05/22 1532) Temp Source: Oral (05/22 1532) BP: 157/94 mmHg (05/22 1532) Pulse Rate: 102 (05/22 1532) Intake/Output from previous day: 05/21 0701 - 05/22 0700 In: 1260 [P.O.:360; I.V.:900] Out: 4250 [Urine:200; Emesis/NG output:4050] Intake/Output from this shift: Total I/O In: 1470 [P.O.:120; I.V.:1350] Out: 475 [Urine:200; Emesis/NG output:250; Blood:25]  Labs:  Recent Labs  06/26/15 0355 06/28/15 0839  WBC 5.7 5.5  HGB 15.7 12.6*  HCT 46.7 39.1  PLT 167 148*     Recent Labs  06/26/15 0355 06/28/15 0839  NA 140 139  K 4.0 4.1  CL 99* 109  CO2 26 21*  GLUCOSE 138* 90  BUN 21* 14  CREATININE 1.48* 1.37*  CALCIUM 8.8* 8.6*   Estimated Creatinine Clearance: 60.3 mL/min (by C-G formula based on Cr of 1.37).    Recent Labs  06/28/15 0731 06/28/15 1105 06/28/15 1343  GLUCAP 90 87 125*    Medical History: Past Medical History  Diagnosis Date  . Diabetes mellitus without complication (HCC)       Insulin Requirements in the past 24 hours:  2 units  Assessment: Justin Yang presented with a four-day history of progressive abdominal pain/distention, nausea and vomiting.  Found to have SBO and underwent ex-lap with LOA on 06/28/15 given no improvement with conservative management.  Pharmacy consulted to initiate TPN for expected prolonged ileus.  GI: PPI IV, LBM 5/15 Endo: DM with A1c 10% - CBGs controlled while NPO Lytes: all WNL except slightly low CO2 Renal: SCr down 1.37 - NS 20K at 125 ml/hr Pulm: RA >> 2L [place] Cards: no hx - BP/HR trending up Hepatobil: LFTs WNL, tbili mildly elevated at 1.5, lipase WNL Neuro: Dilaudid PCA, pain score 4-10 ID: afebrile, WBC WNL - not on  abx Best Practices: Lovenox TPN Access: PICC to be placed 06/28/15 TPN start date: 06/29/15  Current Nutrition:  NPO  Nutritional Goals:  2100-2300 kCal, 110-120 grams of protein per day   Plan:  - Start TPN on 06/29/15 after access is established.  Initiate D5W at 40 ml/hr and reduce NS 20K to 85 ml/hr. - Continue moderate SSI Q4H as ordered - Standard TPN labs   Aryanna Shaver D. Laney Potashang, PharmD, BCPS Pager:  [phone] 06/28/2015, 4:07 PM

## 2015-06-28 NOTE — Transfer of Care (Signed)
Immediate Anesthesia Transfer of Care Note  Patient: Justin Yang  Procedure(s) Performed: Procedure(s): LAPAROSCOPY DIAGNOSTIC (N/A) LYSIS OF ADHESIONS (N/A)  Patient Location: PACU  Anesthesia Type:General  Level of Consciousness: awake, alert  and oriented  Airway & Oxygen Therapy: Patient Spontanous Breathing and Patient connected to nasal cannula oxygen  Post-op Assessment: Report given to RN and Post -op Vital signs reviewed and stable  Post vital signs: Reviewed and stable  Last Vitals:  Filed Vitals:   06/28/15 0359 06/28/15 1312  BP: 119/70   Pulse: 63   Temp: 36.9 C 36.8 C  Resp: 19     Last Pain:  Filed Vitals:   06/28/15 1315  PainSc: 0-No pain         Complications: No apparent anesthesia complications

## 2015-06-28 NOTE — Anesthesia Preprocedure Evaluation (Addendum)
Anesthesia Evaluation  Patient identified by MRN, date of birth, ID band Patient awake    Reviewed: Allergy & Precautions, H&P , NPO status , Patient's Chart, lab work & pertinent test results  History of Anesthesia Complications Negative for: history of anesthetic complications  Airway Mallampati: II  TM Distance: >3 FB Neck ROM: full    Dental no notable dental hx.    Pulmonary neg pulmonary ROS,    Pulmonary exam normal breath sounds clear to auscultation       Cardiovascular negative cardio ROS Normal cardiovascular exam Rhythm:regular Rate:Normal     Neuro/Psych negative neurological ROS     GI/Hepatic negative GI ROS, Neg liver ROS,   Endo/Other  diabetes, Type 2  Renal/GU negative Renal ROS     Musculoskeletal   Abdominal   Peds  Hematology negative hematology ROS (+)   Anesthesia Other Findings   Reproductive/Obstetrics negative OB ROS                            Anesthesia Physical Anesthesia Plan  ASA: III  Anesthesia Plan: General   Post-op Pain Management:    Induction: Intravenous, Rapid sequence and Cricoid pressure planned  Airway Management Planned: Oral ETT  Additional Equipment:   Intra-op Plan:   Post-operative Plan: Extubation in OR  Informed Consent: I have reviewed the patients History and Physical, chart, labs and discussed the procedure including the risks, benefits and alternatives for the proposed anesthesia with the patient or authorized representative who has indicated his/her understanding and acceptance.   Dental Advisory Given  Plan Discussed with: Anesthesiologist, CRNA and Surgeon  Anesthesia Plan Comments:        Anesthesia Quick Evaluation

## 2015-06-29 ENCOUNTER — Encounter (HOSPITAL_COMMUNITY): Payer: Self-pay | Admitting: Surgery

## 2015-06-29 LAB — COMPREHENSIVE METABOLIC PANEL
ALBUMIN: 3 g/dL — AB (ref 3.5–5.0)
ALT: 11 U/L — ABNORMAL LOW (ref 17–63)
AST: 14 U/L — AB (ref 15–41)
Alkaline Phosphatase: 63 U/L (ref 38–126)
Anion gap: 12 (ref 5–15)
BUN: 9 mg/dL (ref 6–20)
CHLORIDE: 105 mmol/L (ref 101–111)
CO2: 19 mmol/L — ABNORMAL LOW (ref 22–32)
Calcium: 8.6 mg/dL — ABNORMAL LOW (ref 8.9–10.3)
Creatinine, Ser: 1.27 mg/dL — ABNORMAL HIGH (ref 0.61–1.24)
GFR calc Af Amer: >60 mL/min (ref >60)
GFR calc non Af Amer: 59 mL/min — ABNORMAL LOW (ref >60)
GLUCOSE: 153 mg/dL — AB (ref 65–99)
POTASSIUM: 4.1 mmol/L (ref 3.5–5.1)
SODIUM: 136 mmol/L (ref 135–145)
Total Bilirubin: 1.3 mg/dL — ABNORMAL HIGH (ref 0.3–1.2)
Total Protein: 6.5 g/dL (ref 6.5–8.1)

## 2015-06-29 LAB — GLUCOSE, CAPILLARY
GLUCOSE-CAPILLARY: 132 mg/dL — AB (ref 65–99)
GLUCOSE-CAPILLARY: 155 mg/dL — AB (ref 65–99)
GLUCOSE-CAPILLARY: 161 mg/dL — AB (ref 65–99)
Glucose-Capillary: 141 mg/dL — ABNORMAL HIGH (ref 65–99)
Glucose-Capillary: 120 mg/dL — ABNORMAL HIGH (ref 65–99)
Glucose-Capillary: 158 mg/dL — ABNORMAL HIGH (ref 65–99)

## 2015-06-29 LAB — DIFFERENTIAL
BASOS ABS: 0 10*3/uL (ref 0.0–0.1)
Basophils Relative: 0 %
EOS ABS: 0 10*3/uL (ref 0.0–0.7)
Eosinophils Relative: 0 %
LYMPHS ABS: 0.9 10*3/uL (ref 0.7–4.0)
LYMPHS PCT: 14 %
Monocytes Absolute: 1.1 10*3/uL — ABNORMAL HIGH (ref 0.1–1.0)
Monocytes Relative: 17 %
NEUTROS PCT: 69 %
Neutro Abs: 4.2 10*3/uL (ref 1.7–7.7)

## 2015-06-29 LAB — MAGNESIUM: Magnesium: 1.8 mg/dL (ref 1.7–2.4)

## 2015-06-29 LAB — TRIGLYCERIDES: TRIGLYCERIDES: 77 mg/dL (ref <150)

## 2015-06-29 LAB — CBC
HCT: 43.6 % (ref 39.0–52.0)
Hemoglobin: 14.6 g/dL (ref 13.0–17.0)
MCH: 32 pg (ref 26.0–34.0)
MCHC: 33.5 g/dL (ref 30.0–36.0)
MCV: 95.6 fL (ref 78.0–100.0)
PLATELETS: 173 10*3/uL (ref 150–400)
RBC: 4.56 MIL/uL (ref 4.22–5.81)
RDW: 12.6 % (ref 11.5–15.5)
WBC: 6.2 10*3/uL (ref 4.0–10.5)

## 2015-06-29 LAB — PHOSPHORUS: PHOSPHORUS: 3.7 mg/dL (ref 2.5–4.6)

## 2015-06-29 LAB — PREALBUMIN: Prealbumin: 10.9 mg/dL — ABNORMAL LOW (ref 18–38)

## 2015-06-29 MED ORDER — SODIUM CHLORIDE 0.9 % IV SOLN
INTRAVENOUS | Status: AC
  Administered 2015-06-29 – 2015-06-30 (×2): via INTRAVENOUS

## 2015-06-29 MED ORDER — FAT EMULSION 20 % IV EMUL
240.0000 mL | INTRAVENOUS | Status: AC
  Administered 2015-06-29: 240 mL via INTRAVENOUS
  Filled 2015-06-29: qty 250

## 2015-06-29 MED ORDER — TRACE MINERALS CR-CU-MN-SE-ZN 10-1000-500-60 MCG/ML IV SOLN
INTRAVENOUS | Status: AC
  Administered 2015-06-29: 18:00:00 via INTRAVENOUS
  Filled 2015-06-29: qty 960

## 2015-06-29 MED ORDER — SODIUM CHLORIDE 0.9% FLUSH
10.0000 mL | INTRAVENOUS | Status: DC | PRN
  Administered 2015-06-30 – 2015-07-06 (×5): 10 mL
  Filled 2015-06-29 (×5): qty 40

## 2015-06-29 MED ORDER — MAGNESIUM SULFATE 2 GM/50ML IV SOLN
2.0000 g | Freq: Once | INTRAVENOUS | Status: AC
Start: 2015-06-29 — End: 2015-06-29
  Administered 2015-06-29: 2 g via INTRAVENOUS
  Filled 2015-06-29: qty 50

## 2015-06-29 NOTE — Progress Notes (Signed)
Offered Pt. A bath pt stated if he get the NG tube out he would get one later on . Also the change of linens

## 2015-06-29 NOTE — Progress Notes (Signed)
NPO

## 2015-06-29 NOTE — Progress Notes (Signed)
PARENTERAL NUTRITION CONSULT NOTE  Pharmacy Consult:  TPN Indication:  Prolonged ileus s/p LOA due to SBO  No Known Allergies  Patient Measurements: Height: 5\' 11"  (180.3 cm) Weight: 188 lb (85.276 kg) IBW/kg (Calculated) : 75.3  Vital Signs: Temp: 99.2 F (37.3 C) (05/23 0359) Temp Source: Oral (05/23 0359) BP: 125/84 mmHg (05/23 0359) Pulse Rate: 103 (05/23 0359) Intake/Output from previous day: 05/22 0701 - 05/23 0700 In: 2230 [P.O.:120; I.V.:2110] Out: 2100 [Urine:1775; Emesis/NG output:300; Blood:25] Intake/Output from this shift:    Labs:  Recent Labs  06/28/15 0839 06/29/15 0503  WBC 5.5 6.2  HGB 12.6* 14.6  HCT 39.1 43.6  PLT 148* 173     Recent Labs  06/28/15 0839 06/29/15 0503  NA 139 136  K 4.1 4.1  CL 109 105  CO2 21* 19*  GLUCOSE 90 153*  BUN 14 9  CREATININE 1.37* 1.27*  CALCIUM 8.6* 8.6*  MG  --  1.8  PHOS  --  3.7  PROT  --  6.5  ALBUMIN  --  3.0*  AST  --  14*  ALT  --  11*  ALKPHOS  --  63  BILITOT  --  1.3*  PREALBUMIN  --  10.9*  TRIG  --  77   Estimated Creatinine Clearance: 65.1 mL/min (by C-G formula based on Cr of 1.27).   Insulin Requirements in the past 24 hours:  7 units SSI  Admit: [age] YOM presented with a four-day history of progressive abdominal pain/distention, nausea and vomiting.  Found to have SBO and underwent ex-lap with LOA on 06/28/15 given no improvement with conservative management.  Pharmacy consulted to initiate TPN for expected prolonged ileus.  Surgeries/Procedures:  5/22: ex lap with LOA  GI: PPI IV, LBM 5/15. Baseline prealbumin 10.9  Endo: DM with A1c 10% - CBGs 90-161 while NPO. PTA he is not taking any medications  Lytes: all WNL except slightly low CO2. Baseline K 4.1 (goal >4), phos 3.7, mag 1.8 (goal >2), CorCa ~9.1  Renal: SCr down 1.27 - NS w/ 20K @ 85 ml/hr, D5 @ 40. UOP 0.669mL/kg/hr if accurately charted  Pulm: 99/2L [place]  Cards: no hx. BP is stable, HR slightly tachy- could be d/t  pain  Hepatobil: LFTs WNL, tbili mildly elevated at 1.3, lipase WNL. Baseline trigs 77  Neuro: Dilaudid PCA, pain score 2-6  ID: afebrile, WBC WNL - not on abx  Best Practices: Lovenox; patient is not on medications PTA  TPN Access: PICC ordered 5/22- not yet placed TPN start date: 06/29/15  Current Nutrition:  NPO  Nutritional Goals: per RD recommendations on 5/22 2100-2300 kCal 110-120 grams of protein per day  Plan:  - magnesium 2g IV x1 - Start Clinimix E 5/15 at 3940mL/hr with 20% fat emulsion at 5110mL/hr- this will provide 48g protein and 1162kcal. (Goal rate will be Clinimix 5/15 at 396mL/hr + 20% fat emulsion at 4310mL/hr- this goal rate will provide 115g protein and 2116kcal which will meet 100% of patient's goal) - Multivitamin and trace elements to be added to TPN bag - Stop D5 when TPN starts tonight, change other MIVF to plain NS- continue at same rate of 5585mL/hr - Continue moderate SSI Q4H as ordered- will need to watch closely with start of TPN, possibly will need to add insulin to TPN bag - BMET, phos, and mag in the morning   Justin Yang, PharmD, BCPS Clinical Pharmacist Pager: [phone] 06/29/2015 8:15 AM

## 2015-06-29 NOTE — Progress Notes (Signed)
[name], male   DOB: [birth year], 60 y.o.   MRN: [id number]     [facility]      [address] [place], [place] [address]    Phone: [phone] FAX: [phone]     Subjective: No flatus. No n/v. Sore.  Using IS. VSS.  Afebrile. Mild tachy.   Objective:  Vital signs:  Filed Vitals:   06/28/15 2356 06/29/15 0000 06/29/15 0359 06/29/15 0437  BP: 136/96  125/84   Pulse: 114  103   Temp: 98.2 F (36.8 C)  99.2 F (37.3 C)   TempSrc: Oral  Oral   Resp: _0 Height:      Weight:      SpO2: 100% 98% 99% 99%    Last BM Date: 06/21/15  Intake/Output   Yesterday:  05/22 0701 - 05/23 0700 In: 2230 [P.O.:120; I.V.:2110] Out: 2100 [Urine:1775; Emesis/NG output:300; Blood:25] This shift: I/O last 3 completed shifts: In: 3370 [P.O.:360; I.V.:3010] Out: 3800 [PNPYY:5110; Emesis/NG output:1800; Blood:25]    Physical Exam: General: Pt awake/alert/oriented x4 in no acute distress Chest: cta.  No chest wall pain w good excursion CV:  Pulses intact.  Regular rhythm Abdomen: no Bs, distended, tender around incision.  Honeycomb dressing, blood inferior aspect, no active bleeding.  No evidence of peritonitis.  No incarcerated hernias. Ext:  SCDs BLE.  No mjr edema.  No cyanosis Skin: No petechiae / purpura   Problem List:   Active Problems:   Small bowel obstruction (HCC)    Results:   Labs: Results for orders placed or performed during the hospital encounter of 06/25/15 (from the past 48 hour(s))  Glucose, capillary     Status: Abnormal   Collection Time: 06/27/15 11:56 AM  Result Value Ref Range   Glucose-Capillary 102 (H) 65 - 99 mg/dL  Glucose, capillary     Status: Abnormal   Collection Time: 06/27/15  4:37 PM  Result Value Ref Range   Glucose-Capillary 104 (H) 65 - 99 mg/dL  Glucose, capillary     Status: Abnormal   Collection Time: 06/27/15  8:05 PM  Result Value Ref Range    Glucose-Capillary 100 (H) 65 - 99 mg/dL  Glucose, capillary     Status: Abnormal   Collection Time: 06/27/15 11:52 PM  Result Value Ref Range   Glucose-Capillary 100 (H) 65 - 99 mg/dL  Glucose, capillary     Status: None   Collection Time: 06/28/15  3:58 AM  Result Value Ref Range   Glucose-Capillary 92 65 - 99 mg/dL  Glucose, capillary     Status: None   Collection Time: 06/28/15  7:31 AM  Result Value Ref Range   Glucose-Capillary 90 65 - 99 mg/dL  CBC     Status: Abnormal   Collection Time: 06/28/15  8:39 AM  Result Value Ref Range   WBC 5.5 4.0 - 10.5 K/uL   RBC 4.04 (L) 4.22 - 5.81 MIL/uL   Hemoglobin 12.6 (L) 13.0 - 17.0 g/dL   HCT 39.1 39.0 - 52.0 %   MCV 96.8 78.0 - 100.0 fL   MCH 31.2 26.0 - 34.0 pg   MCHC 32.2 30.0 - 36.0 g/dL   RDW 12.6 11.5 - 15.5 %   Platelets 148 (L) 150 - 400 K/uL  Basic metabolic panel     Status: Abnormal   Collection Time: 06/28/15  8:39 AM  Result Value Ref Range   Sodium 139 135 -  145 mmol/L   Potassium 4.1 3.5 - 5.1 mmol/L   Chloride 109 101 - 111 mmol/L   CO2 21 (L) 22 - 32 mmol/L   Glucose, Bld 90 65 - 99 mg/dL   BUN 14 6 - 20 mg/dL   Creatinine, Ser 1.37 (H) 0.61 - 1.24 mg/dL   Calcium 8.6 (L) 8.9 - 10.3 mg/dL   GFR calc non Af Amer 54 (L) >60 mL/min   GFR calc Af Amer >60 >60 mL/min    Comment: (NOTE) The eGFR has been calculated using the CKD EPI equation. This calculation has not been validated in all clinical situations. eGFR's persistently <60 mL/min signify possible Chronic Kidney Disease.    Anion gap 9 5 - 15  Surgical pcr screen     Status: Abnormal   Collection Time: 06/28/15 10:49 AM  Result Value Ref Range   MRSA, PCR NEGATIVE NEGATIVE   Staphylococcus aureus POSITIVE (A) NEGATIVE    Comment:        The Xpert SA Assay (FDA approved for NASAL specimens in patients over 16 years of age), is one component of a comprehensive surveillance program.  Test performance has been validated by [facility] for patients  greater than or equal to 61 year old. It is not intended to diagnose infection nor to guide or monitor treatment.   Glucose, capillary     Status: None   Collection Time: 06/28/15 11:05 AM  Result Value Ref Range   Glucose-Capillary 87 65 - 99 mg/dL  Glucose, capillary     Status: Abnormal   Collection Time: 06/28/15  1:43 PM  Result Value Ref Range   Glucose-Capillary 125 (H) 65 - 99 mg/dL  Glucose, capillary     Status: Abnormal   Collection Time: 06/28/15  4:05 PM  Result Value Ref Range   Glucose-Capillary 138 (H) 65 - 99 mg/dL  Glucose, capillary     Status: Abnormal   Collection Time: 06/28/15  7:51 PM  Result Value Ref Range   Glucose-Capillary 120 (H) 65 - 99 mg/dL  Glucose, capillary     Status: Abnormal   Collection Time: 06/28/15 11:57 PM  Result Value Ref Range   Glucose-Capillary 161 (H) 65 - 99 mg/dL  Glucose, capillary     Status: Abnormal   Collection Time: 06/29/15  4:01 AM  Result Value Ref Range   Glucose-Capillary 141 (H) 65 - 99 mg/dL   Comment 1 Notify RN   CBC     Status: None   Collection Time: 06/29/15  5:03 AM  Result Value Ref Range   WBC 6.2 4.0 - 10.5 K/uL   RBC 4.56 4.22 - 5.81 MIL/uL   Hemoglobin 14.6 13.0 - 17.0 g/dL   HCT 43.6 39.0 - 52.0 %   MCV 95.6 78.0 - 100.0 fL   MCH 32.0 26.0 - 34.0 pg   MCHC 33.5 30.0 - 36.0 g/dL   RDW 12.6 11.5 - 15.5 %   Platelets 173 150 - 400 K/uL  Comprehensive metabolic panel     Status: Abnormal   Collection Time: 06/29/15  5:03 AM  Result Value Ref Range   Sodium 136 135 - 145 mmol/L   Potassium 4.1 3.5 - 5.1 mmol/L   Chloride 105 101 - 111 mmol/L   CO2 19 (L) 22 - 32 mmol/L   Glucose, Bld 153 (H) 65 - 99 mg/dL   BUN 9 6 - 20 mg/dL   Creatinine, Ser 1.27 (H) 0.61 - 1.24 mg/dL   Calcium  8.6 (L) 8.9 - 10.3 mg/dL   Total Protein 6.5 6.5 - 8.1 g/dL   Albumin 3.0 (L) 3.5 - 5.0 g/dL   AST 14 (L) 15 - 41 U/L   ALT 11 (L) 17 - 63 U/L   Alkaline Phosphatase 63 38 - 126 U/L   Total Bilirubin 1.3 (H) 0.3  - 1.2 mg/dL   GFR calc non Af Amer 59 (L) >60 mL/min   GFR calc Af Amer >60 >60 mL/min    Comment: (NOTE) The eGFR has been calculated using the CKD EPI equation. This calculation has not been validated in all clinical situations. eGFR's persistently <60 mL/min signify possible Chronic Kidney Disease.    Anion gap 12 5 - 15  Prealbumin     Status: Abnormal   Collection Time: 06/29/15  5:03 AM  Result Value Ref Range   Prealbumin 10.9 (L) 18 - 38 mg/dL  Magnesium     Status: None   Collection Time: 06/29/15  5:03 AM  Result Value Ref Range   Magnesium 1.8 1.7 - 2.4 mg/dL  Phosphorus     Status: None   Collection Time: 06/29/15  5:03 AM  Result Value Ref Range   Phosphorus 3.7 2.5 - 4.6 mg/dL  Differential     Status: Abnormal   Collection Time: 06/29/15  5:03 AM  Result Value Ref Range   Neutrophils Relative % 69 %   Neutro Abs 4.2 1.7 - 7.7 K/uL   Lymphocytes Relative 14 %   Lymphs Abs 0.9 0.7 - 4.0 K/uL   Monocytes Relative 17 %   Monocytes Absolute 1.1 (H) 0.1 - 1.0 K/uL   Eosinophils Relative 0 %   Eosinophils Absolute 0.0 0.0 - 0.7 K/uL   Basophils Relative 0 %   Basophils Absolute 0.0 0.0 - 0.1 K/uL  Triglycerides     Status: None   Collection Time: 06/29/15  5:03 AM  Result Value Ref Range   Triglycerides 77 <150 mg/dL    Imaging / Studies: Dg Abd Portable 1v-small Bowel Obstruction Protocol-initial, 8 Hr Delay  06/28/2015  CLINICAL DATA:  8 hours status post administration of oral contrast. Abdominal distention. Initial encounter. EXAM: PORTABLE ABDOMEN - 1 VIEW COMPARISON:  Abdominal radiograph performed earlier today at 7:09 a.m. FINDINGS: There is dilatation of small-bowel loops to 5.1 cm in maximal diameter, compatible with persistent small bowel obstruction. Administered oral contrast is not well characterized, likely due to dilution. No free intra-abdominal air is seen, though evaluation for free air is limited on a single supine view. No acute osseous  abnormalities are identified. An enteric tube is noted ending overlying the body of the stomach. IMPRESSION: Dilatation of small-bowel loops to 5.1 cm in maximal diameter, compatible with persistent high-grade small bowel obstruction. Administered oral contrast is not well characterized, likely due to dilution. No free intra-abdominal air seen. Electronically Signed   By: Garald Balding M.D.   On: 06/28/2015 00:19    Medications / Allergies:  Scheduled Meds: . enoxaparin (LOVENOX) injection  40 mg Subcutaneous Q24H  . HYDROmorphone   Intravenous Q4H  . insulin aspart  0-15 Units Subcutaneous Q4H  . pantoprazole (PROTONIX) IV  40 mg Intravenous QHS   Continuous Infusions: . 0.9 % NaCl with KCl 20 mEq / L 85 mL/hr at 06/29/15 0027  . dextrose 40 mL/hr at 06/28/15 1733   PRN Meds:.antiseptic oral rinse, diphenhydrAMINE **OR** diphenhydrAMINE, morphine injection, naloxone **AND** sodium chloride flush, ondansetron **OR** ondansetron (ZOFRAN) IV, phenol  Antibiotics: Anti-infectives  Start     Dose/Rate Route Frequency Ordered Stop   06/28/15 1000  [MAR Hold]  cefoTEtan (CEFOTAN) 2 g in dextrose 5 % 50 mL IVPB  Status:  Discontinued     (MAR Hold since 06/28/15 1059)   2 g 100 mL/hr over 30 Minutes Intravenous Every 12 hours 06/28/15 0903 06/28/15 1513          Assessment/Plan SBO POD#1 Diagnostic laparoscopy, exploratory laparotomy, LOA---Dr. Ninfa Linden -suspect a prolonged post operative ileus, start TPN, continue NGT, bowel rest, PCA -mobilize, IS(1241m) -DC foley FEN-IVF until TPN started. PCM-prealbumin 10 VTE prophylaxis-SCD, lovenox DM II-SSI, CBGs Dispo-ileus    EErby Pian ANP-BC [facility] Pager [phone](7A-4:30P) For consults and floor pages call [phone](7A-4:30P)  06/29/2015 7:57 AM

## 2015-06-29 NOTE — Progress Notes (Signed)
Peripherally Inserted Central Catheter/Midline Placement  The IV Nurse has discussed with the patient and/or persons authorized to consent for the patient, the purpose of this procedure and the potential benefits and risks involved with this procedure.  The benefits include less needle sticks, lab draws from the catheter and patient may be discharged home with the catheter.  Risks include, but not limited to, infection, bleeding, blood clot (thrombus formation), and puncture of an artery; nerve damage and irregular heat beat.  Alternatives to this procedure were also discussed.  PICC/Midline Placement Documentation        Bertran Zeimet, Lajean ManesKerry Loraine 06/29/2015, 12:16 PM

## 2015-06-29 NOTE — Op Note (Signed)
[name]:  Yang, Justin                ACCOUNT NO.:  [id number]  MEDICAL RECORD NO.:  [id number]  LOCATION:  [id number]                        FACILITY:  [facility]  PHYSICIAN:  Abigail Miyamotoouglas Inola Lisle, M.D. DATE OF BIRTH:  1956-01-10  DATE OF PROCEDURE:  06/28/2015 DATE OF DISCHARGE:                              OPERATIVE REPORT   PREOPERATIVE DIAGNOSIS:  Small bowel obstruction.  POSTOPERATIVE DIAGNOSIS:  Small bowel obstruction.  PROCEDURE: 1. Diagnostic laparoscopy. 2. Exploratory laparotomy. 3. Lysis of adhesions.  SURGEON:  Abigail Miyamotoouglas Sedra Morfin, M.D.  ANESTHESIA:  General and 0.25% Marcaine.  ESTIMATED BLOOD LOSS:  Minimal.  INDICATIONS:  This is a [age] year old gentleman who presented with a small bowel obstruction.  He failed our small bowel protocol.  He had a CAT scan as well and no contrast had made it out of the small intestines into the colon.  It appeared to be a transition zone in the distal ileum, therefore decision was made to proceed with laparoscopy.  FINDINGS:  The patient was found to have a very thick adhesive band from the omentum going down to the bowel mesentery creating a near complete small bowel obstruction.  There was small bowel that was adhesed to the sigmoid colon as well, but this did not create an obstruction.  I did lyse these adhesions as well as the main adhesion.  I did not have to resect any intestines.  There was a lot of ascites in the abdomen from this bowel.  PROCEDURE IN DETAIL:  The patient was brought to the operating room, identified as Justin Yang.  He was placed supine on the operating table and general anesthesia was induced.  His abdomen was then prepped and draped in usual sterile fashion.  I made a small vertical incision above the umbilicus with a scalpel.  I carried this down to the fascia, which was then opened with a scalpel as well.  A hemostat was then used to pass into the peritoneal cavity under direct vision.  A 0 Vicryl pursestring  suture was then placed around the fascial opening.  The Hasson port was placed in the opening and insufflation of the abdomen was begun.  Upon placing the camera in the abdomen, the patient had several loops of very dilated small bowel which appeared quite injected. There was also free fluid in the abdomen.  I placed a 5-mm trocar in the patient's upper midline and another in the left lower quadrant both under direct vision.  I was able to identify the cecum.  I then started running the small bowel from the terminal ileum proximally.  There was a loop of small bowel tethered to the redundant loop of sigmoid colon which I could not easily free up.  It then dove down below the mesentery and I could not follow further.  The small bowel again was quite dilated.  At this point, I could not identify what was creating the obstruction, so I decided to convert to an open procedure.  All trocars were removed.  I then created a midline incision with a scalpel.  I took this down through the fascia with electrocautery, then opened the peritoneum entirely through the incision.  It became apparent  there was a large adhesive band of omentum going across the small bowel tethering to the mesentery of the small bowel.  This created a complete obstruction from the small bowel.  I was able to take this down with electrocautery.  There was minimal narrowing of the small bowel.  I then evaluated the loop of small bowel stuck to the sigmoid colon.  I freed this up with the Metzenbaum scissors and found no specific pathology in this area.  At this point, I ran the small bowel from the ligament of Treitz to the terminal ileum.  I milked all of the small bowel contents back into the stomach and out of the nasogastric tube.  I then milked the small bowel contents past the area of obstruction easily and until the distal small bowel was dilated well.  At this point, I irrigated the abdomen with normal saline.   Hemostasis appeared to be achieved.  I then closed the patient's midline fascia with a running looped PDS suture. The skin was then irrigated and closed with skin staples.  The patient tolerated the procedure well.  All counts were correct at the end of the procedure.  The patient was then extubated in the operating room and taken in a stable condition to the recovery room.     Abigail Miyamoto, M.D.     DB/MEDQ  D:  06/28/2015  T:  06/29/2015  Job:  [id number]

## 2015-06-30 LAB — GLUCOSE, CAPILLARY
GLUCOSE-CAPILLARY: 146 mg/dL — AB (ref 65–99)
GLUCOSE-CAPILLARY: 156 mg/dL — AB (ref 65–99)
Glucose-Capillary: 167 mg/dL — ABNORMAL HIGH (ref 65–99)
Glucose-Capillary: 168 mg/dL — ABNORMAL HIGH (ref 65–99)
Glucose-Capillary: 180 mg/dL — ABNORMAL HIGH (ref 65–99)
Glucose-Capillary: 155 mg/dL — ABNORMAL HIGH (ref 65–99)

## 2015-06-30 LAB — BASIC METABOLIC PANEL
ANION GAP: 7 (ref 5–15)
BUN: 7 mg/dL (ref 6–20)
CALCIUM: 8.3 mg/dL — AB (ref 8.9–10.3)
CHLORIDE: 102 mmol/L (ref 101–111)
CO2: 25 mmol/L (ref 22–32)
Creatinine, Ser: 0.89 mg/dL (ref 0.61–1.24)
GFR calc non Af Amer: >60 mL/min (ref >60)
Glucose, Bld: 154 mg/dL — ABNORMAL HIGH (ref 65–99)
POTASSIUM: 3.5 mmol/L (ref 3.5–5.1)
SODIUM: 134 mmol/L — AB (ref 135–145)

## 2015-06-30 LAB — PHOSPHORUS: Phosphorus: 2.5 mg/dL (ref 2.5–4.6)

## 2015-06-30 LAB — MAGNESIUM: Magnesium: 2 mg/dL (ref 1.7–2.4)

## 2015-06-30 MED ORDER — FAT EMULSION 20 % IV EMUL
240.0000 mL | INTRAVENOUS | Status: DC
  Administered 2015-06-30: 240 mL via INTRAVENOUS
  Filled 2015-06-30: qty 250

## 2015-06-30 MED ORDER — MAGNESIUM SULFATE IN D5W 1-5 GM/100ML-% IV SOLN
1.0000 g | Freq: Once | INTRAVENOUS | Status: AC
  Administered 2015-06-30: 1 g via INTRAVENOUS
  Filled 2015-06-30: qty 100

## 2015-06-30 MED ORDER — TRACE MINERALS CR-CU-MN-SE-ZN 10-1000-500-60 MCG/ML IV SOLN
INTRAVENOUS | Status: DC
  Administered 2015-06-30: 18:00:00 via INTRAVENOUS
  Filled 2015-06-30: qty 1440

## 2015-06-30 MED ORDER — SODIUM CHLORIDE 0.9 % IV SOLN
INTRAVENOUS | Status: DC
  Administered 2015-06-30 – 2015-07-01 (×2): via INTRAVENOUS

## 2015-06-30 MED ORDER — POTASSIUM CHLORIDE 10 MEQ/50ML IV SOLN
10.0000 meq | INTRAVENOUS | Status: AC
  Administered 2015-06-30 (×4): 10 meq via INTRAVENOUS
  Filled 2015-06-30 (×5): qty 50

## 2015-06-30 NOTE — Progress Notes (Signed)
PARENTERAL NUTRITION CONSULT NOTE  Pharmacy Consult:  TPN Indication:  Prolonged ileus s/p LOA due to SBO  No Known Allergies  Patient Measurements: Height: 5\' 11"  (180.3 cm) Weight: 188 lb (85.276 kg) IBW/kg (Calculated) : 75.3  Intake/Output from previous day: 05/23 0701 - 05/24 0700 In: 1897.7 [P.O.:120; I.V.:1118.8; NG/GT:30; TPN:629] Out: 2125 [Urine:1775; Emesis/NG output:350] Intake/Output from this shift:    Labs:  Recent Labs  06/28/15 0839 06/29/15 0503  WBC 5.5 6.2  HGB 12.6* 14.6  HCT 39.1 43.6  PLT 148* 173     Recent Labs  06/28/15 0839 06/29/15 0503 06/30/15 0450  NA 139 136 134*  K 4.1 4.1 3.5  CL 109 105 102  CO2 21* 19* 25  GLUCOSE 90 153* 154*  BUN 14 9 7   CREATININE 1.37* 1.27* 0.89  CALCIUM 8.6* 8.6* 8.3*  MG  --  1.8 2.0  PHOS  --  3.7 2.5  PROT  --  6.5  --   ALBUMIN  --  3.0*  --   AST  --  14*  --   ALT  --  11*  --   ALKPHOS  --  63  --   BILITOT  --  1.3*  --   PREALBUMIN  --  10.9*  --   TRIG  --  77  --    Estimated Creatinine Clearance: 92.8 mL/min (by C-G formula based on Cr of 0.89).   Insulin Requirements in the past 24 hours:  18 units SSI  Admit: [age] YOM presented with a four-day history of progressive abdominal pain/distention, nausea and vomiting.  Found to have SBO and underwent ex-lap with LOA on 06/28/15 given no improvement with conservative management.  Pharmacy consulted to initiate TPN for expected prolonged ileus.  Surgeries/Procedures:  5/22: ex lap with LOA  GI: PPI IV. Baseline prealbumin 10.9. Last BM 5/15  Endo: DM with A1c 10% - not on any medications PTA. CBGs 141-158.   Lytes: K 3.5 (goal >4), phos 2.5, mag 2 (goal >2), CorCa ~8.8  Renal: SCr down 0.89 - NS @ 85 ml/hr. UOP 0.969mL/kg/hr if accurately charted  Pulm: 100/2L [place]  Cards: no hx. VSS  Hepatobil: LFTs WNL, tbili mildly elevated at 1.3, lipase WNL. Baseline trigs 77  Neuro: Dilaudid PCA, pain score 3-6  ID: afebrile, WBC WNL -  not on abx  Best Practices: Lovenox; patient is not on medications PTA  TPN Access: PICC placed 5/23 TPN start date: 06/29/15  Current Nutrition:  NPO Clinimix E 5/15 at 7340mL/hr with 20% fat emulsion at 7210mL/hr- provides 48g protein and 1162kcal. (Goal rate will be Clinimix 5/15 at 1296mL/hr + 20% fat emulsion at 1710mL/hr- this goal rate will provide 115g protein and 2116kcal which will meet 100% of patient's goal)  Nutritional Goals: per RD recommendations on 5/22 2100-2300 kCal 110-120 grams of protein per day  Plan:  - KCl 10mEq runs IV x4, magnesium sulfate 1g IV x1 to ensure level stays in range - Increase Clinimix E 5/15 to 3060mL/hr with 20% fat emulsion at 4110mL/hr- this will provide 72g protein and 1502kcal. - Multivitamin and trace elements to be added to TPN bag - Decrease NS to 6565mL/hr at 1800 tonight when TPN rate increased - Continue moderate SSI Q4H as ordered- continue to watch closely, possibly will need to add insulin to TPN bag or other long acting insulin - CMET in the morning- watch lytes- some decrease this morning which could indicate refeeding   Franchelle Foskett D. Kendahl Bumgardner,  PharmD, BCPS Clinical Pharmacist Pager: [phone] 06/30/2015 7:32 AM

## 2015-06-30 NOTE — Progress Notes (Signed)
[name], male   DOB: [birth year], 60 y.o.   MRN: [id number]     [facility]      [address] [place], [place] [address]    Phone: [phone] FAX: [phone]     Subjective: No n/v. 343m ngt output, no flatus.  Voiding VSS.  Afebrile.   Objective:  Vital signs:  Filed Vitals:   06/30/15 0331 06/30/15 0450 06/30/15 0735 06/30/15 0815  BP:  125/78 124/74   Pulse:  88 87   Temp:  98.4 F (36.9 C) 98.9 F (37.2 C)   TempSrc:   Oral   Resp: '9 16 14 22  '$ Height:      Weight:      SpO2: 99% 96% 100% 99%    Last BM Date: 06/21/15  Intake/Output   Yesterday:  05/23 0701 - 05/24 0700 In: 1897.7 [P.O.:120; I.V.:1118.8; NG/GT:30; TPN:629] Out: 2125 [Urine:1775; Emesis/NG output:350] This shift: I/O last 3 completed shifts: In: 2657.7 [P.O.:120; I.V.:1878.8; NG/GT:30] Out: 3250 [Urine:2850; Emesis/NG output:400]    Physical Exam: General: Pt awake/alert/oriented x4 in no acute distress Chest: cta. No chest wall pain w good excursion CV: Pulses intact. Regular rhythm Abdomen: +Bs, distended, tender around incision. Honeycomb dressing, blood inferior aspect, no active bleeding. No evidence of peritonitis. No incarcerated hernias. Ext: SCDs BLE. No mjr edema. No cyanosis Skin: No petechiae / purpura   Problem List:   Active Problems:   Small bowel obstruction (HCC)    Results:   Labs: Results for orders placed or performed during the hospital encounter of 06/25/15 (from the past 48 hour(s))  CBC     Status: Abnormal   Collection Time: 06/28/15  8:39 AM  Result Value Ref Range   WBC 5.5 4.0 - 10.5 K/uL   RBC 4.04 (L) 4.22 - 5.81 MIL/uL   Hemoglobin 12.6 (L) 13.0 - 17.0 g/dL   HCT 39.1 39.0 - 52.0 %   MCV 96.8 78.0 - 100.0 fL   MCH 31.2 26.0 - 34.0 pg   MCHC 32.2 30.0 - 36.0 g/dL   RDW 12.6 11.5 - 15.5 %   Platelets 148 (L) 150 - 400 K/uL  Basic metabolic panel     Status: Abnormal    Collection Time: 06/28/15  8:39 AM  Result Value Ref Range   Sodium 139 135 - 145 mmol/L   Potassium 4.1 3.5 - 5.1 mmol/L   Chloride 109 101 - 111 mmol/L   CO2 21 (L) 22 - 32 mmol/L   Glucose, Bld 90 65 - 99 mg/dL   BUN 14 6 - 20 mg/dL   Creatinine, Ser 1.37 (H) 0.61 - 1.24 mg/dL   Calcium 8.6 (L) 8.9 - 10.3 mg/dL   GFR calc non Af Amer 54 (L) >60 mL/min   GFR calc Af Amer >60 >60 mL/min    Comment: (NOTE) The eGFR has been calculated using the CKD EPI equation. This calculation has not been validated in all clinical situations. eGFR's persistently <60 mL/min signify possible Chronic Kidney Disease.    Anion gap 9 5 - 15  Surgical pcr screen     Status: Abnormal   Collection Time: 06/28/15 10:49 AM  Result Value Ref Range   MRSA, PCR NEGATIVE NEGATIVE   Staphylococcus aureus POSITIVE (A) NEGATIVE    Comment:        The Xpert SA Assay (FDA approved for NASAL specimens in patients over 221years of age), is one component of  a comprehensive surveillance program.  Test performance has been validated by [facility] for patients greater than or equal to 34 year old. It is not intended to diagnose infection nor to guide or monitor treatment.   Glucose, capillary     Status: None   Collection Time: 06/28/15 11:05 AM  Result Value Ref Range   Glucose-Capillary 87 65 - 99 mg/dL  Glucose, capillary     Status: Abnormal   Collection Time: 06/28/15  1:43 PM  Result Value Ref Range   Glucose-Capillary 125 (H) 65 - 99 mg/dL  Glucose, capillary     Status: Abnormal   Collection Time: 06/28/15  4:05 PM  Result Value Ref Range   Glucose-Capillary 138 (H) 65 - 99 mg/dL  Glucose, capillary     Status: Abnormal   Collection Time: 06/28/15  7:51 PM  Result Value Ref Range   Glucose-Capillary 120 (H) 65 - 99 mg/dL  Glucose, capillary     Status: Abnormal   Collection Time: 06/28/15 11:57 PM  Result Value Ref Range   Glucose-Capillary 161 (H) 65 - 99 mg/dL  Glucose, capillary      Status: Abnormal   Collection Time: 06/29/15  4:01 AM  Result Value Ref Range   Glucose-Capillary 141 (H) 65 - 99 mg/dL   Comment 1 Notify RN   CBC     Status: None   Collection Time: 06/29/15  5:03 AM  Result Value Ref Range   WBC 6.2 4.0 - 10.5 K/uL   RBC 4.56 4.22 - 5.81 MIL/uL   Hemoglobin 14.6 13.0 - 17.0 g/dL   HCT 43.6 39.0 - 52.0 %   MCV 95.6 78.0 - 100.0 fL   MCH 32.0 26.0 - 34.0 pg   MCHC 33.5 30.0 - 36.0 g/dL   RDW 12.6 11.5 - 15.5 %   Platelets 173 150 - 400 K/uL  Comprehensive metabolic panel     Status: Abnormal   Collection Time: 06/29/15  5:03 AM  Result Value Ref Range   Sodium 136 135 - 145 mmol/L   Potassium 4.1 3.5 - 5.1 mmol/L   Chloride 105 101 - 111 mmol/L   CO2 19 (L) 22 - 32 mmol/L   Glucose, Bld 153 (H) 65 - 99 mg/dL   BUN 9 6 - 20 mg/dL   Creatinine, Ser 1.27 (H) 0.61 - 1.24 mg/dL   Calcium 8.6 (L) 8.9 - 10.3 mg/dL   Total Protein 6.5 6.5 - 8.1 g/dL   Albumin 3.0 (L) 3.5 - 5.0 g/dL   AST 14 (L) 15 - 41 U/L   ALT 11 (L) 17 - 63 U/L   Alkaline Phosphatase 63 38 - 126 U/L   Total Bilirubin 1.3 (H) 0.3 - 1.2 mg/dL   GFR calc non Af Amer 59 (L) >60 mL/min   GFR calc Af Amer >60 >60 mL/min    Comment: (NOTE) The eGFR has been calculated using the CKD EPI equation. This calculation has not been validated in all clinical situations. eGFR's persistently <60 mL/min signify possible Chronic Kidney Disease.    Anion gap 12 5 - 15  Prealbumin     Status: Abnormal   Collection Time: 06/29/15  5:03 AM  Result Value Ref Range   Prealbumin 10.9 (L) 18 - 38 mg/dL  Magnesium     Status: None   Collection Time: 06/29/15  5:03 AM  Result Value Ref Range   Magnesium 1.8 1.7 - 2.4 mg/dL  Phosphorus     Status: None  Collection Time: 06/29/15  5:03 AM  Result Value Ref Range   Phosphorus 3.7 2.5 - 4.6 mg/dL  Differential     Status: Abnormal   Collection Time: 06/29/15  5:03 AM  Result Value Ref Range   Neutrophils Relative % 69 %   Neutro Abs 4.2 1.7 -  7.7 K/uL   Lymphocytes Relative 14 %   Lymphs Abs 0.9 0.7 - 4.0 K/uL   Monocytes Relative 17 %   Monocytes Absolute 1.1 (H) 0.1 - 1.0 K/uL   Eosinophils Relative 0 %   Eosinophils Absolute 0.0 0.0 - 0.7 K/uL   Basophils Relative 0 %   Basophils Absolute 0.0 0.0 - 0.1 K/uL  Triglycerides     Status: None   Collection Time: 06/29/15  5:03 AM  Result Value Ref Range   Triglycerides 77 <150 mg/dL  Glucose, capillary     Status: Abnormal   Collection Time: 06/29/15  8:06 AM  Result Value Ref Range   Glucose-Capillary 120 (H) 65 - 99 mg/dL   Comment 1 Notify RN   Glucose, capillary     Status: Abnormal   Collection Time: 06/29/15  1:10 PM  Result Value Ref Range   Glucose-Capillary 158 (H) 65 - 99 mg/dL   Comment 1 Notify RN   Glucose, capillary     Status: Abnormal   Collection Time: 06/29/15  5:51 PM  Result Value Ref Range   Glucose-Capillary 132 (H) 65 - 99 mg/dL  Glucose, capillary     Status: Abnormal   Collection Time: 06/29/15  8:45 PM  Result Value Ref Range   Glucose-Capillary 155 (H) 65 - 99 mg/dL   Comment 1 Notify RN    Comment 2 Document in Chart   Glucose, capillary     Status: Abnormal   Collection Time: 06/30/15 12:26 AM  Result Value Ref Range   Glucose-Capillary 156 (H) 65 - 99 mg/dL   Comment 1 Notify RN    Comment 2 Document in Chart   Glucose, capillary     Status: Abnormal   Collection Time: 06/30/15  4:18 AM  Result Value Ref Range   Glucose-Capillary 146 (H) 65 - 99 mg/dL   Comment 1 Notify RN    Comment 2 Document in Chart   Basic metabolic panel     Status: Abnormal   Collection Time: 06/30/15  4:50 AM  Result Value Ref Range   Sodium 134 (L) 135 - 145 mmol/L   Potassium 3.5 3.5 - 5.1 mmol/L   Chloride 102 101 - 111 mmol/L   CO2 25 22 - 32 mmol/L   Glucose, Bld 154 (H) 65 - 99 mg/dL   BUN 7 6 - 20 mg/dL   Creatinine, Ser 0.89 0.61 - 1.24 mg/dL   Calcium 8.3 (L) 8.9 - 10.3 mg/dL   GFR calc non Af Amer >60 >60 mL/min   GFR calc Af Amer >60  >60 mL/min    Comment: (NOTE) The eGFR has been calculated using the CKD EPI equation. This calculation has not been validated in all clinical situations. eGFR's persistently <60 mL/min signify possible Chronic Kidney Disease.    Anion gap 7 5 - 15  Magnesium     Status: None   Collection Time: 06/30/15  4:50 AM  Result Value Ref Range   Magnesium 2.0 1.7 - 2.4 mg/dL  Phosphorus     Status: None   Collection Time: 06/30/15  4:50 AM  Result Value Ref Range   Phosphorus 2.5 2.5 -  4.6 mg/dL  Glucose, capillary     Status: Abnormal   Collection Time: 06/30/15  7:31 AM  Result Value Ref Range   Glucose-Capillary 167 (H) 65 - 99 mg/dL    Imaging / Studies: No results found.  Medications / Allergies:  Scheduled Meds: . enoxaparin (LOVENOX) injection  40 mg Subcutaneous Q24H  . HYDROmorphone   Intravenous Q4H  . insulin aspart  0-15 Units Subcutaneous Q4H  . magnesium sulfate 1 - 4 g bolus IVPB  1 g Intravenous Once  . pantoprazole (PROTONIX) IV  40 mg Intravenous QHS  . potassium chloride  10 mEq Intravenous Q1 Hr x 4   Continuous Infusions: . sodium chloride 85 mL/hr at 06/30/15 0448  . sodium chloride    . Marland KitchenTPN (CLINIMIX-E) Adult 40 mL/hr at 06/29/15 1748   And  . fat emulsion 240 mL (06/29/15 1749)  . Marland KitchenTPN (CLINIMIX-E) Adult     And  . fat emulsion     PRN Meds:.antiseptic oral rinse, diphenhydrAMINE **OR** diphenhydrAMINE, morphine injection, naloxone **AND** sodium chloride flush, ondansetron **OR** ondansetron (ZOFRAN) IV, phenol, sodium chloride flush  Antibiotics: Anti-infectives    Start     Dose/Rate Route Frequency Ordered Stop   06/28/15 1000  [MAR Hold]  cefoTEtan (CEFOTAN) 2 g in dextrose 5 % 50 mL IVPB  Status:  Discontinued     (MAR Hold since 06/28/15 1059)   2 g 100 mL/hr over 30 Minutes Intravenous Every 12 hours 06/28/15 0903 06/28/15 1513        Assessment/Plan SBO POD#2 Diagnostic laparoscopy, exploratory laparotomy, LOA---Dr.  Ninfa Linden -suspect a prolonged post operative ileus, TPN started, continue NGT, bowel rest, PCA -mobilize, IS(1228m) FEN-K and Mg run today PCM-prealbumin 10.  TPN VTE prophylaxis-SCD, lovenox DM II-SSI, CBGs Dispo-ileus    EErby Pian ANP-BC [facility] Pager [phone](7A-4:30P) For consults and floor pages call [phone](7A-4:30P)  06/30/2015 8:22 AM

## 2015-06-30 NOTE — Progress Notes (Signed)
Nutrition Follow-up  DOCUMENTATION CODES:   Not applicable  INTERVENTION:   -TPN management per pharmacy  NUTRITION DIAGNOSIS:   Inadequate oral intake related to inability to eat as evidenced by NPO status.  Ongoing  GOAL:   Patient will meet greater than or equal to 90% of their needs   Ongoing  MONITOR:   Diet advancement, PO intake, Labs, Weight trends, I & O's  REASON FOR ASSESSMENT:   Consult New TPN/TNA  ASSESSMENT:   60 yo Male with a history of diabetes type 2 who presents with a four day history of progressive abdominal distention, abdominal pain, nausea, and vomiting. He has had very poor PO intake and decreased urination. Last BM was several days ago. He has attempted to use laxatives without any improvement. He has been unable to take his Metformin. No previous abdominal surgery. He presented to the ED for evaluation and was found to have findings consistent with an SBO  Patient s/p procedures 5/22: LAPAROSCOPY DIAGNOSTIC EXPLORATORY LAPAROTOMY LYSIS OF ADHESIONS  PICC line placed and TPN initiated on 06/29/15. Pt currently receiving Clinimix E 5/15 at 7340mL/hr with 20% fat emulsion at 8910mL/hr- this will provide 48g protein and 1162kcal (55% of estimated kcal needs and 44% of estimated protein needs). Per pharmacy note, goal rate will be Clinimix 5/15 at 7296mL/hr + 20% fat emulsion at 7210mL/hr- this goal rate will provide 115g protein and 2116kcal which will meet 100% of patient's goal.   Pt remains NGT; noted 280 ml output within the past 24 hours.   Labs reviewed: CBGS: 146-167. K, Mg, and Phos WDL.   Diet Order:  Diet NPO time specified Except for: Ice Chips TPN (CLINIMIX-E) Adult TPN (CLINIMIX-E) Adult  Skin:  Reviewed, no issues  Last BM:  06/21/15  Height:   Ht Readings from Last 1 Encounters:  06/25/15 5\' 11"  (1.803 m)    Weight:   Wt Readings from Last 1 Encounters:  06/25/15 188 lb (85.276 kg)    Ideal Body Weight:  78.1  kg  BMI:  Body mass index is 26.23 kg/(m^2).  Estimated Nutritional Needs:   Kcal:  2100-2300  Protein:  110-120 gm  Fluid:  2.1-2.3 L  EDUCATION NEEDS:   No education needs identified at this time  Alice Vitelli A. Mayford KnifeWilliams, RD, LDN, CDE Pager: [phone] After hours Pager: [phone]

## 2015-07-01 LAB — GLUCOSE, CAPILLARY
GLUCOSE-CAPILLARY: 162 mg/dL — AB (ref 65–99)
GLUCOSE-CAPILLARY: 217 mg/dL — AB (ref 65–99)
Glucose-Capillary: 117 mg/dL — ABNORMAL HIGH (ref 65–99)
Glucose-Capillary: 133 mg/dL — ABNORMAL HIGH (ref 65–99)
Glucose-Capillary: 175 mg/dL — ABNORMAL HIGH (ref 65–99)
Glucose-Capillary: 208 mg/dL — ABNORMAL HIGH (ref 65–99)

## 2015-07-01 LAB — COMPREHENSIVE METABOLIC PANEL
ALT: 9 U/L — AB (ref 17–63)
AST: 14 U/L — ABNORMAL LOW (ref 15–41)
Albumin: 2.6 g/dL — ABNORMAL LOW (ref 3.5–5.0)
Alkaline Phosphatase: 59 U/L (ref 38–126)
Anion gap: 8 (ref 5–15)
BILIRUBIN TOTAL: 0.5 mg/dL (ref 0.3–1.2)
BUN: 8 mg/dL (ref 6–20)
CHLORIDE: 101 mmol/L (ref 101–111)
CO2: 26 mmol/L (ref 22–32)
CREATININE: 1 mg/dL (ref 0.61–1.24)
Calcium: 8.5 mg/dL — ABNORMAL LOW (ref 8.9–10.3)
Glucose, Bld: 200 mg/dL — ABNORMAL HIGH (ref 65–99)
Potassium: 3.2 mmol/L — ABNORMAL LOW (ref 3.5–5.1)
Sodium: 135 mmol/L (ref 135–145)
TOTAL PROTEIN: 6 g/dL — AB (ref 6.5–8.1)

## 2015-07-01 LAB — MAGNESIUM: MAGNESIUM: 1.9 mg/dL (ref 1.7–2.4)

## 2015-07-01 LAB — PHOSPHORUS: PHOSPHORUS: 2.6 mg/dL (ref 2.5–4.6)

## 2015-07-01 MED ORDER — TRACE MINERALS CR-CU-MN-SE-ZN 10-1000-500-60 MCG/ML IV SOLN
INTRAVENOUS | Status: AC
  Administered 2015-07-01: 17:00:00 via INTRAVENOUS
  Filled 2015-07-01: qty 960

## 2015-07-01 MED ORDER — POTASSIUM CHLORIDE 10 MEQ/50ML IV SOLN
10.0000 meq | INTRAVENOUS | Status: AC
  Administered 2015-07-01 (×4): 10 meq via INTRAVENOUS
  Filled 2015-07-01 (×4): qty 50

## 2015-07-01 MED ORDER — FAT EMULSION 20 % IV EMUL
120.0000 mL | INTRAVENOUS | Status: AC
Start: 2015-07-01 — End: 2015-07-02
  Administered 2015-07-01: 120 mL via INTRAVENOUS
  Filled 2015-07-01: qty 200

## 2015-07-01 MED ORDER — FAT EMULSION 20 % IV EMUL
240.0000 mL | INTRAVENOUS | Status: AC
  Filled 2015-07-01: qty 250

## 2015-07-01 MED ORDER — BISACODYL 10 MG RE SUPP
10.0000 mg | Freq: Once | RECTAL | Status: AC
  Administered 2015-07-01: 10 mg via RECTAL
  Filled 2015-07-01: qty 1

## 2015-07-01 MED ORDER — MAGNESIUM SULFATE 2 GM/50ML IV SOLN
2.0000 g | Freq: Once | INTRAVENOUS | Status: AC
  Administered 2015-07-01: 2 g via INTRAVENOUS
  Filled 2015-07-01: qty 50

## 2015-07-01 MED ORDER — M.V.I. ADULT IV INJ
INJECTION | INTRAVENOUS | Status: AC
Start: 2015-07-01 — End: 2015-07-01
  Filled 2015-07-01: qty 1440

## 2015-07-01 NOTE — Progress Notes (Signed)
NPO

## 2015-07-01 NOTE — Progress Notes (Signed)
[name], male   DOB: [birth year], 60 y.o.   MRN: [id number]     [facility]      [address] [place], [place] [address]    Phone: [phone] FAX: [phone]     Subjective: Somewhat better, still difficult to swallow. Vss.  Afebrile.  Denies sob, cp, palpations.  Denies n/v. Minimal ngt output. No flatus. Ambulating.  Voiding.   Objective:  Vital signs:  Filed Vitals:   07/01/15 0000 07/01/15 0105 07/01/15 0400 07/01/15 0554  BP:  114/72  118/70  Pulse:  87  92  Temp:  99 F (37.2 C)  99.9 F (37.7 C)  TempSrc:      Resp: _0 Height:      Weight:      SpO2: 98% 95% 98% 98%    Last BM Date:  (prior to surgery)  Intake/Output   Yesterday:  05/24 0701 - 05/25 0700 In: 400 [TPN:400] Out: 1550 [Urine:1400; Emesis/NG output:150] This shift: I/O last 3 completed shifts: In: 2121.7 [P.O.:120; I.V.:1002.6; NG/GT:30] Out: 2525 [Urine:2125; Emesis/NG output:400]    Physical Exam: General: Pt awake/alert/oriented x4 in no acute distress Chest: cta. No chest wall pain w good excursion CV: Pulses intact. Regular rhythm Abdomen: +Bs, soft, non distended, tender around incision. dressings removed, midline incision and lap sites with staples in place, no erythema, edges are approximated.  No evidence of peritonitis. No incarcerated hernias. Ext: SCDs BLE. No mjr edema. No cyanosis Skin: No petechiae / purpura    Problem List:   Active Problems:   Small bowel obstruction ([place])    Results:   Labs: Results for orders placed or performed during the hospital encounter of 06/25/15 (from the past 48 hour(s))  Glucose, capillary     Status: Abnormal   Collection Time: 06/29/15  1:10 PM  Result Value Ref Range   Glucose-Capillary 158 (H) 65 - 99 mg/dL   Comment 1 Notify RN   Glucose, capillary     Status: Abnormal   Collection Time: 06/29/15  5:51 PM  Result Value Ref Range   Glucose-Capillary 132 (H) 65 - 99 mg/dL  Glucose, capillary     Status: Abnormal   Collection Time: 06/29/15  8:45 PM  Result Value Ref Range   Glucose-Capillary 155 (H) 65 - 99 mg/dL   Comment 1 Notify RN    Comment 2 Document in Chart   Glucose, capillary     Status: Abnormal   Collection Time: 06/30/15 12:26 AM  Result Value Ref Range   Glucose-Capillary 156 (H) 65 - 99 mg/dL   Comment 1 Notify RN    Comment 2 Document in Chart   Glucose, capillary     Status: Abnormal   Collection Time: 06/30/15  4:18 AM  Result Value Ref Range   Glucose-Capillary 146 (H) 65 - 99 mg/dL   Comment 1 Notify RN    Comment 2 Document in Chart   Basic metabolic panel     Status: Abnormal   Collection Time: 06/30/15  4:50 AM  Result Value Ref Range   Sodium 134 (L) 135 - 145 mmol/L   Potassium 3.5 3.5 - 5.1 mmol/L   Chloride 102 101 - 111 mmol/L   CO2 25 22 - 32 mmol/L   Glucose, Bld 154 (H) 65 - 99 mg/dL   BUN 7 6 - 20 mg/dL   Creatinine, Ser 0.89 0.61 - 1.24 mg/dL   Calcium 8.3 (  L) 8.9 - 10.3 mg/dL   GFR calc non Af Amer >60 >60 mL/min   GFR calc Af Amer >60 >60 mL/min    Comment: (NOTE) The eGFR has been calculated using the CKD EPI equation. This calculation has not been validated in all clinical situations. eGFR's persistently <60 mL/min signify possible Chronic Kidney Disease.    Anion gap 7 5 - 15  Magnesium     Status: None   Collection Time: 06/30/15  4:50 AM  Result Value Ref Range   Magnesium 2.0 1.7 - 2.4 mg/dL  Phosphorus     Status: None   Collection Time: 06/30/15  4:50 AM  Result Value Ref Range   Phosphorus 2.5 2.5 - 4.6 mg/dL  Glucose, capillary     Status: Abnormal   Collection Time: 06/30/15  7:31 AM  Result Value Ref Range   Glucose-Capillary 167 (H) 65 - 99 mg/dL  Glucose, capillary     Status: Abnormal   Collection Time: 06/30/15 11:16 AM  Result Value Ref Range   Glucose-Capillary 168 (H) 65 - 99 mg/dL  Glucose, capillary     Status: Abnormal   Collection  Time: 06/30/15  4:57 PM  Result Value Ref Range   Glucose-Capillary 180 (H) 65 - 99 mg/dL  Glucose, capillary     Status: Abnormal   Collection Time: 06/30/15  7:49 PM  Result Value Ref Range   Glucose-Capillary 155 (H) 65 - 99 mg/dL   Comment 1 Notify RN    Comment 2 Document in Chart   Glucose, capillary     Status: Abnormal   Collection Time: 07/01/15 12:05 AM  Result Value Ref Range   Glucose-Capillary 133 (H) 65 - 99 mg/dL  Glucose, capillary     Status: Abnormal   Collection Time: 07/01/15  4:09 AM  Result Value Ref Range   Glucose-Capillary 175 (H) 65 - 99 mg/dL   Comment 1 Notify RN    Comment 2 Document in Chart     Imaging / Studies: No results found.  Medications / Allergies:  Scheduled Meds: . bisacodyl  10 mg Rectal Once  . enoxaparin (LOVENOX) injection  40 mg Subcutaneous Q24H  . HYDROmorphone   Intravenous Q4H  . insulin aspart  0-15 Units Subcutaneous Q4H  . pantoprazole (PROTONIX) IV  40 mg Intravenous QHS   Continuous Infusions: . sodium chloride 65 mL/hr at 06/30/15 1947  . Marland KitchenTPN (CLINIMIX-E) Adult 60 mL/hr at 07/01/15 0500   And  . fat emulsion 80 kcal (07/01/15 0500)   PRN Meds:.antiseptic oral rinse, diphenhydrAMINE **OR** diphenhydrAMINE, morphine injection, naloxone **AND** sodium chloride flush, ondansetron **OR** ondansetron (ZOFRAN) IV, phenol, sodium chloride flush  Antibiotics: Anti-infectives    Start     Dose/Rate Route Frequency Ordered Stop   06/28/15 1000  [MAR Hold]  cefoTEtan (CEFOTAN) 2 g in dextrose 5 % 50 mL IVPB  Status:  Discontinued     (MAR Hold since 06/28/15 1059)   2 g 100 mL/hr over 30 Minutes Intravenous Every 12 hours 06/28/15 0903 06/28/15 1513       Assessment/Plan SBO POD#3 Diagnostic laparoscopy, exploratory laparotomy, LOA---Dr. Ninfa Linden -suspect a prolonged post operative ileus, TPN started, continue TPN, DC NGT and continue with just ice chips.  Give dulcolax suppository -mobilize, IS(1264m) Enlarged  uvula/pain-DC ngt, continue chloraseptic spray.  If no improvement 24h will call back Dr. BJanace Hoard but I suspect it will get better with time.  PCM-prealbumin 10. TPN VTE prophylaxis-SCD, lovenox DM II-SSI, CBGs Dispo-ileus  Erby Pian, [facility] Pager [phone]) For consults and floor pages call [phone](7A-4:30P)  07/01/2015 8:12 AM

## 2015-07-01 NOTE — Progress Notes (Signed)
PARENTERAL NUTRITION CONSULT NOTE - FOLLOW UP  Pharmacy Consult for TPN Indication: Prolonged ileus  No Known Allergies  Patient Measurements: Height:  (180.3 cm) Weight: 188 lb (85.276 kg) IBW/kg (Calculated) : 75.3   Vital Signs: Temp: 98.2 F (36.8 C) (05/25 0936) Temp Source: Oral (05/25 0936) BP: 126/77 mmHg (05/25 0936) Pulse Rate: 81 (05/25 0936) Intake/Output from previous day: 05/24 0701 - 05/25 0700 In: 400 [TPN:400] Out: 1550 [Urine:1400; Emesis/NG output:150] Intake/Output from this shift: Total I/O In: -  Out: 300 [Urine:300]  Labs:  Recent Labs  06/29/15 0503  WBC 6.2  HGB 14.6  HCT 43.6  PLT 173     Recent Labs  06/29/15 0503 06/30/15 0450 07/01/15 0741  NA 136 134* 135  K 4.1 3.5 3.2*  CL 105 102 101  CO2 19* 25 26  GLUCOSE 153* 154* 200*  BUN CREATININE 1.27* 0.89 1.00  CALCIUM 8.6* 8.3* 8.5*  MG 1.8 2.0 1.9  PHOS 3.7 2.5 2.6  PROT 6.5  --  6.0*  ALBUMIN 3.0*  --  2.6*  AST 14*  --  14*  ALT 11*  --  9*  ALKPHOS 63  --  59  BILITOT 1.3*  --  0.5  PREALBUMIN 10.9*  --   --   TRIG 77  --   --    Estimated Creatinine Clearance: 82.6 mL/min (by C-G formula based on Cr of 1).    Recent Labs  07/01/15 0005 07/01/15 0409 07/01/15 0832  GLUCAP 133* 175* 208*    Medications:  Scheduled:  . enoxaparin (LOVENOX) injection  40 mg Subcutaneous Q24H  . HYDROmorphone   Intravenous Q4H  . insulin aspart  0-15 Units Subcutaneous Q4H  . pantoprazole (PROTONIX) IV  40 mg Intravenous QHS    Insulin Requirements in the past 24 hours:  13 units mod SSI (since rate increase last PM)  Admit: 45 YOM presented with a four-day history of progressive abdominal pain/distention, nausea and vomiting. Found to have SBO and underwent ex-lap with LOA on 06/28/15 given no improvement with conservative management. Pharmacy consulted to initiate TPN for expected prolonged ileus.  Surgeries/Procedures:  5/22: ex lap with LOA  GI:  PPI IV. Baseline prealbumin 10.9. Last BM 5/15  Endo: DM with A1c 10% - not on any medications PTA. CBGs 133-208 since TPN advanced.   Lytes: K 3.2 (goal >4)- s/p K runs x 4, phos 2.6, Mg 1.9 (goal >2)- s/p Mg 1g IV, CorCa ~9.6.  Possible refeeding syndrome given fall in K and Mg despite supplementation.  Renal: SCr down 0.89 - NS @ 85 ml/hr. UOP 0.56mL/kg/hr if accurately charted  Pulm: 100/2L [place]  Cards: no hx. VSS  Hepatobil: LFTs WNL, tbili mildly elevated at 1.3, lipase WNL. Baseline trigs 77  Neuro: Dilaudid PCA, pain score 3-6  ID: afebrile, WBC WNL - not on abx  Best Practices: Lovenox; patient is not on medications PTA  TPN Access: PICC placed 5/23 TPN start date: 06/29/15  Current Nutrition:  NPO Clinimix E 5/15 at 69mL/hr with 20% fat emulsion at 53mL/hr- provides 48g protein and 1162kcal. Goal rate will be Clinimix 5/15 at 22mL/hr + 20% fat emulsion at 17mL/hr- this goal rate will provide 115g protein and 2116kcal which will meet 100% of patient's goal  Nutritional Goals: per RD 5/22 2100-2300 kCal 110-120 grams of protein per day  Plan:  Repeat KCl runs IV x4 and give Mg 2g IV x 1 Decrease Clinimix E  5/15 to 5140mL/hr and 20% fat emulsion to 25ml/hr now (called RN) Multivitamin and trace elements to be added to TPN bag Increase NS to 1585mL/hr  Continue moderate SSI Q4H as ordered Add Insulin 10units to TPN BMet, Mg, Phos TPN labs qMon/Thurs   Marisue HumbleKendra Alyanna Stoermer, PharmD Clinical Pharmacist [facility]

## 2015-07-02 LAB — GLUCOSE, CAPILLARY
GLUCOSE-CAPILLARY: 154 mg/dL — AB (ref 65–99)
GLUCOSE-CAPILLARY: 156 mg/dL — AB (ref 65–99)
GLUCOSE-CAPILLARY: 197 mg/dL — AB (ref 65–99)
Glucose-Capillary: 170 mg/dL — ABNORMAL HIGH (ref 65–99)
Glucose-Capillary: 209 mg/dL — ABNORMAL HIGH (ref 65–99)
Glucose-Capillary: 181 mg/dL — ABNORMAL HIGH (ref 65–99)

## 2015-07-02 LAB — BASIC METABOLIC PANEL
Anion gap: 7 (ref 5–15)
BUN: 8 mg/dL (ref 6–20)
CHLORIDE: 102 mmol/L (ref 101–111)
CO2: 28 mmol/L (ref 22–32)
CREATININE: 0.89 mg/dL (ref 0.61–1.24)
Calcium: 8.5 mg/dL — ABNORMAL LOW (ref 8.9–10.3)
GFR calc Af Amer: >60 mL/min (ref >60)
GFR calc non Af Amer: >60 mL/min (ref >60)
Glucose, Bld: 153 mg/dL — ABNORMAL HIGH (ref 65–99)
POTASSIUM: 3 mmol/L — AB (ref 3.5–5.1)
Sodium: 137 mmol/L (ref 135–145)

## 2015-07-02 LAB — MAGNESIUM: Magnesium: 2 mg/dL (ref 1.7–2.4)

## 2015-07-02 LAB — POTASSIUM: POTASSIUM: 4 mmol/L (ref 3.5–5.1)

## 2015-07-02 LAB — PHOSPHORUS: PHOSPHORUS: 3.1 mg/dL (ref 2.5–4.6)

## 2015-07-02 MED ORDER — METOCLOPRAMIDE HCL 5 MG/ML IJ SOLN
10.0000 mg | Freq: Four times a day (QID) | INTRAMUSCULAR | Status: DC
  Administered 2015-07-02 – 2015-07-06 (×17): 10 mg via INTRAVENOUS
  Filled 2015-07-02 (×17): qty 2

## 2015-07-02 MED ORDER — ACETAMINOPHEN 650 MG RE SUPP
650.0000 mg | Freq: Four times a day (QID) | RECTAL | Status: DC | PRN
  Administered 2015-07-02: 650 mg via RECTAL
  Filled 2015-07-02 (×2): qty 1

## 2015-07-02 MED ORDER — FAT EMULSION 20 % IV EMUL
120.0000 mL | INTRAVENOUS | Status: AC
  Administered 2015-07-02: 120 mL via INTRAVENOUS
  Filled 2015-07-02: qty 200

## 2015-07-02 MED ORDER — POTASSIUM CHLORIDE 10 MEQ/50ML IV SOLN
10.0000 meq | INTRAVENOUS | Status: AC
  Administered 2015-07-02 (×4): 10 meq via INTRAVENOUS
  Filled 2015-07-02 (×4): qty 50

## 2015-07-02 MED ORDER — M.V.I. ADULT IV INJ
INJECTION | INTRAVENOUS | Status: AC
  Administered 2015-07-02: 18:00:00 via INTRAVENOUS
  Filled 2015-07-02: qty 960

## 2015-07-02 MED ORDER — MAGNESIUM SULFATE 2 GM/50ML IV SOLN
2.0000 g | Freq: Once | INTRAVENOUS | Status: AC
  Administered 2015-07-02: 2 g via INTRAVENOUS
  Filled 2015-07-02: qty 50

## 2015-07-02 MED ORDER — POTASSIUM CHLORIDE IN NACL 20-0.9 MEQ/L-% IV SOLN
INTRAVENOUS | Status: AC
  Administered 2015-07-02 – 2015-07-04 (×3): via INTRAVENOUS
  Filled 2015-07-02 (×4): qty 1000

## 2015-07-02 NOTE — Progress Notes (Signed)
Pt's temp is 101.2, paged Dr. Andrey CampanileWilson to inform, pt had tylenol 650mg  RE at 1751, MD ordered UA now and CBC in am.

## 2015-07-02 NOTE — Progress Notes (Signed)
Linens were changed by the pt's daughter after he vomited . i asked him if he wanted a quick wash up after vomiting but pt. Refused

## 2015-07-02 NOTE — Progress Notes (Signed)
NPO

## 2015-07-02 NOTE — Progress Notes (Addendum)
PARENTERAL NUTRITION CONSULT NOTE - FOLLOW UP  Pharmacy Consult for TPN Indication: Prolonged ileus  No Known Allergies  Patient Measurements: Height:  (180.3 cm) Weight: 188 lb (85.276 kg) IBW/kg (Calculated) : 75.3  Vital Signs: Temp: 98.4 F (36.9 C) (05/26 0505) Temp Source: Oral (05/26 0505) BP: 125/76 mmHg (05/26 0505) Pulse Rate: 87 (05/26 0505) Intake/Output from previous day: 05/25 0701 - 05/26 0700 In: 3365 [I.V.:2063; TPN:1302] Out: 1625 [Urine:1375; Emesis/NG output:250] Intake/Output from this shift:    Labs: No results for input(s): WBC, HGB, HCT, PLT, APTT, INR in the last 72 hours.   Recent Labs  06/30/15 0450 07/01/15 0741 07/02/15 0520  NA 134* 135 137  K 3.5 3.2* 3.0*  CL 102 101 102  CO2 GLUCOSE 154* 200* 153*  BUN CREATININE 0.89 1.00 0.89  CALCIUM 8.3* 8.5* 8.5*  MG 2.0 1.9 2.0  PHOS 2.5 2.6 3.1  PROT  --  6.0*  --   ALBUMIN  --  2.6*  --   AST  --  14*  --   ALT  --  9*  --   ALKPHOS  --  59  --   BILITOT  --  0.5  --    Estimated Creatinine Clearance: 92.8 mL/min (by C-G formula based on Cr of 0.89).    Recent Labs  07/02/15 0010 07/02/15 0436 07/02/15 0805  GLUCAP 154* 156* 170*    Medications:  Scheduled:  . enoxaparin (LOVENOX) injection  40 mg Subcutaneous Q24H  . HYDROmorphone   Intravenous Q4H  . insulin aspart  0-15 Units Subcutaneous Q4H  . pantoprazole (PROTONIX) IV  40 mg Intravenous QHS    Insulin Requirements in the past 24 hours:  17 units mod SSI  Admit: 50 YOM presented with a four-day history of progressive abdominal pain/distention, nausea and vomiting. Found to have SBO and underwent ex-lap with LOA on 06/28/15 given no improvement with conservative management. Pharmacy consulted to initiate TPN for expected prolonged ileus.  Surgeries/Procedures:  5/22: ex lap with LOA  GI:  PPI IV. Baseline prealbumin 10.  (-)flatus, abd soft & non-distended.  (+)emesis this AM.  Last BM  5/15  Endo: DM with A1c 10% - not on any medications PTA. CBGs 154-170.   Lytes: K 3 (goal >4)- s/p K runs x 4, phos 2.6, Mg 2 (goal >2)- s/p Mg 2g IV, CorCa ~9.6. Possible refeeding syndrome given fall in K despite supplementation.    Renal: SCr down 0.89 - NS @ 85 ml/hr. UOP 0.64ml/kg/hr  Pulm: To RA this AM  Cards: no hx. VSS  Hepatobil: LFTs WNL, tbili mildly elevated at 1.3, lipase WNL. Baseline trigs 77  Neuro: Dilaudid PCA- /24hr, pain score 4-5  ID:  AFeb, WBC WNL - not on abx  Best Practices: Lovenox; patient is not on medications PTA  TPN Access: PICC placed 5/23 TPN start date: 06/29/15  Current Nutrition:  NPO Clinimix E 5/15 at 63mL/hr with 20% fat emulsion at 31mL/hr.  Goal rates are Clinimix 5/15 at 79mL/hr + 20% fat emulsion at 58mL/hr to provide 115g protein and 2116kcal which will meet 100% nutritional needs  Nutritional Goals: per RD 5/22 2100-2300 kCal 110-120 grams of protein per day  Plan:  Repeat KCl runs IV x 4 Repeat Mg 2g IV, to achieve goal > 2 for ileus Continue Clinimix E 5/15 at 4mL/hr and 20% fat emulsion at 22ml/hr  Multivitamin and trace elements to be added  to TPN bag Increase Insulin to 18 units Add 20K to IVF and continue at 7885mL/hr This will provide additional 40K/24hr Repeat K this evening BMet, Mg, Phos in AM TPN labs qMon/Thurs   Marisue HumbleKendra Jizel Cheeks, PharmD Clinical Pharmacist [facility] [place] [place]

## 2015-07-02 NOTE — Progress Notes (Signed)
Pt. Refused a bath . Pt. Stated he was not feeling well and he would not like to get a bath today

## 2015-07-02 NOTE — Progress Notes (Signed)
[name], male   DOB: [birth year], 60 y.o.   MRN: [id number]     [facility]      [address] [place], [place] [address]    Phone: [phone] FAX: [phone]     Subjective: Started passing flatus yesterday, but vomited x2 today ~500cc bile. Walking. Afebrile.  VSS. k 3, replace.   Objective:  Vital signs:  Filed Vitals:   07/02/15 0443 07/02/15 0505 07/02/15 0820 07/02/15 0847  BP:  125/76    Pulse:  87    Temp:  98.4 F (36.9 C)    TempSrc:  Oral    Resp: 19  20   Height:      Weight:      SpO2: 99% 98% 98% 95%    Last BM Date: 06/21/15  Intake/Output   Yesterday:  05/25 0701 - 05/26 0700 In: 3365 [I.V.:2063; TPN:1302] Out: 0175 [ZWCHE:5277; Emesis/NG output:250] This shift:  Total I/O In: 130 [I.V.:85; TPN:45] Out: -   Physical Exam: General: Pt awake/alert/oriented x4 in no acute distress Chest: cta. No chest wall pain w good excursion CV: Pulses intact. Regular rhythm Abdomen: +Bs, soft, non distended, tender around incision. midline incision and lap sites with staples in place, no erythema, edges are approximated. No evidence of peritonitis. No incarcerated hernias. Ext: SCDs BLE. No mjr edema. No cyanosis Skin: No petechiae / purpura   Problem List:   Active Problems:   Small bowel obstruction (HCC)    Results:   Labs: Results for orders placed or performed during the hospital encounter of 06/25/15 (from the past 48 hour(s))  Glucose, capillary     Status: Abnormal   Collection Time: 06/30/15  4:57 PM  Result Value Ref Range   Glucose-Capillary 180 (H) 65 - 99 mg/dL  Glucose, capillary     Status: Abnormal   Collection Time: 06/30/15  7:49 PM  Result Value Ref Range   Glucose-Capillary 155 (H) 65 - 99 mg/dL   Comment 1 Notify RN    Comment 2 Document in Chart   Glucose, capillary     Status: Abnormal   Collection Time: 07/01/15 12:05 AM  Result Value Ref  Range   Glucose-Capillary 133 (H) 65 - 99 mg/dL  Glucose, capillary     Status: Abnormal   Collection Time: 07/01/15  4:09 AM  Result Value Ref Range   Glucose-Capillary 175 (H) 65 - 99 mg/dL   Comment 1 Notify RN    Comment 2 Document in Chart   Comprehensive metabolic panel     Status: Abnormal   Collection Time: 07/01/15  7:41 AM  Result Value Ref Range   Sodium 135 135 - 145 mmol/L   Potassium 3.2 (L) 3.5 - 5.1 mmol/L   Chloride 101 101 - 111 mmol/L   CO2 26 22 - 32 mmol/L   Glucose, Bld 200 (H) 65 - 99 mg/dL   BUN 8 6 - 20 mg/dL   Creatinine, Ser 1.00 0.61 - 1.24 mg/dL   Calcium 8.5 (L) 8.9 - 10.3 mg/dL   Total Protein 6.0 (L) 6.5 - 8.1 g/dL   Albumin 2.6 (L) 3.5 - 5.0 g/dL   AST 14 (L) 15 - 41 U/L   ALT 9 (L) 17 - 63 U/L   Alkaline Phosphatase 59 38 - 126 U/L   Total Bilirubin 0.5 0.3 - 1.2 mg/dL   GFR calc non Af Amer >60 >60 mL/min   GFR calc Af Amer >  60 >60 mL/min    Comment: (NOTE) The eGFR has been calculated using the CKD EPI equation. This calculation has not been validated in all clinical situations. eGFR's persistently <60 mL/min signify possible Chronic Kidney Disease.    Anion gap 8 5 - 15  Magnesium     Status: None   Collection Time: 07/01/15  7:41 AM  Result Value Ref Range   Magnesium 1.9 1.7 - 2.4 mg/dL  Phosphorus     Status: None   Collection Time: 07/01/15  7:41 AM  Result Value Ref Range   Phosphorus 2.6 2.5 - 4.6 mg/dL  Glucose, capillary     Status: Abnormal   Collection Time: 07/01/15  8:32 AM  Result Value Ref Range   Glucose-Capillary 208 (H) 65 - 99 mg/dL   Comment 1 Notify RN   Glucose, capillary     Status: Abnormal   Collection Time: 07/01/15 12:06 PM  Result Value Ref Range   Glucose-Capillary 162 (H) 65 - 99 mg/dL   Comment 1 Notify RN   Glucose, capillary     Status: Abnormal   Collection Time: 07/01/15  4:17 PM  Result Value Ref Range   Glucose-Capillary 217 (H) 65 - 99 mg/dL   Comment 1 Notify RN   Glucose, capillary      Status: Abnormal   Collection Time: 07/01/15  7:56 PM  Result Value Ref Range   Glucose-Capillary 117 (H) 65 - 99 mg/dL  Glucose, capillary     Status: Abnormal   Collection Time: 07/02/15 12:10 AM  Result Value Ref Range   Glucose-Capillary 154 (H) 65 - 99 mg/dL  Glucose, capillary     Status: Abnormal   Collection Time: 07/02/15  4:36 AM  Result Value Ref Range   Glucose-Capillary 156 (H) 65 - 99 mg/dL  Basic metabolic panel     Status: Abnormal   Collection Time: 07/02/15  5:20 AM  Result Value Ref Range   Sodium 137 135 - 145 mmol/L   Potassium 3.0 (L) 3.5 - 5.1 mmol/L   Chloride 102 101 - 111 mmol/L   CO2 28 22 - 32 mmol/L   Glucose, Bld 153 (H) 65 - 99 mg/dL   BUN 8 6 - 20 mg/dL   Creatinine, Ser 0.89 0.61 - 1.24 mg/dL   Calcium 8.5 (L) 8.9 - 10.3 mg/dL   GFR calc non Af Amer >60 >60 mL/min   GFR calc Af Amer >60 >60 mL/min    Comment: (NOTE) The eGFR has been calculated using the CKD EPI equation. This calculation has not been validated in all clinical situations. eGFR's persistently <60 mL/min signify possible Chronic Kidney Disease.    Anion gap 7 5 - 15  Magnesium     Status: None   Collection Time: 07/02/15  5:20 AM  Result Value Ref Range   Magnesium 2.0 1.7 - 2.4 mg/dL  Phosphorus     Status: None   Collection Time: 07/02/15  5:20 AM  Result Value Ref Range   Phosphorus 3.1 2.5 - 4.6 mg/dL  Glucose, capillary     Status: Abnormal   Collection Time: 07/02/15  8:05 AM  Result Value Ref Range   Glucose-Capillary 170 (H) 65 - 99 mg/dL   Comment 1 Notify RN     Imaging / Studies: No results found.  Medications / Allergies:  Scheduled Meds: . enoxaparin (LOVENOX) injection  40 mg Subcutaneous Q24H  . HYDROmorphone   Intravenous Q4H  . insulin aspart  0-15 Units Subcutaneous  Q4H  . magnesium sulfate 1 - 4 g bolus IVPB  2 g Intravenous Once  . metoCLOPramide (REGLAN) injection  10 mg Intravenous Q6H  . pantoprazole (PROTONIX) IV  40 mg Intravenous QHS  .  potassium chloride  10 mEq Intravenous Q1 Hr x 4   Continuous Infusions: . 0.9 % NaCl with KCl 20 mEq / L 85 mL/hr at 07/02/15 1046  . Marland KitchenTPN (CLINIMIX-E) Adult 40 mL/hr at 07/01/15 1717   And  . fat emulsion 120 mL (07/01/15 1717)  . Marland KitchenTPN (CLINIMIX-E) Adult     And  . fat emulsion     PRN Meds:.antiseptic oral rinse, diphenhydrAMINE **OR** diphenhydrAMINE, morphine injection, naloxone **AND** sodium chloride flush, ondansetron **OR** ondansetron (ZOFRAN) IV, phenol, sodium chloride flush  Antibiotics: Anti-infectives    Start     Dose/Rate Route Frequency Ordered Stop   06/28/15 1000  [MAR Hold]  cefoTEtan (CEFOTAN) 2 g in dextrose 5 % 50 mL IVPB  Status:  Discontinued     (MAR Hold since 06/28/15 1059)   2 g 100 mL/hr over 30 Minutes Intravenous Every 12 hours 06/28/15 0903 06/28/15 1513       Assessment/Plan SBO POD#4 Diagnostic laparoscopy, exploratory laparotomy, LOA---Dr. Ninfa Linden -suspect a prolonged post operative ileus, TPN started, continue TPN -if he vomits again, replace NGT -mobilize, IS(1236m) Enlarged uvula/pain-improved  FEN-K 3, replace PCM-prealbumin 10. TPN VTE prophylaxis-SCD, lovenox DM II-SSI, CBGs Dispo-ileus    EErby Pian ANP-BC [facility] Pager [phone](7A-4:30P) For consults and floor pages call [phone](7A-4:30P)  07/02/2015 11:23 AM

## 2015-07-02 NOTE — Progress Notes (Signed)
07/02/15  1426  Paged Emina,NP regarding patient temp 100.4  Pt is NPO and has nothing ordered for fever. Waiting response

## 2015-07-02 NOTE — Progress Notes (Addendum)
07/02/15 1715  Called [facility] [phone] to have on call MD(Dr Andrey CampanileWilson) paged regarding Patients Temp 100.4  Waiting for response. MD returned call. Per MD he will place orders

## 2015-07-03 LAB — CBC
HEMATOCRIT: 33.6 % — AB (ref 39.0–52.0)
HEMOGLOBIN: 11.3 g/dL — AB (ref 13.0–17.0)
MCH: 31.5 pg (ref 26.0–34.0)
MCHC: 33.6 g/dL (ref 30.0–36.0)
MCV: 93.6 fL (ref 78.0–100.0)
Platelets: 197 10*3/uL (ref 150–400)
RBC: 3.59 MIL/uL — ABNORMAL LOW (ref 4.22–5.81)
RDW: 12.3 % (ref 11.5–15.5)
WBC: 5 10*3/uL (ref 4.0–10.5)

## 2015-07-03 LAB — URINALYSIS, ROUTINE W REFLEX MICROSCOPIC
GLUCOSE, UA: 500 mg/dL — AB
HGB URINE DIPSTICK: NEGATIVE
Ketones, ur: 15 mg/dL — AB
Nitrite: NEGATIVE
PH: 5.5 (ref 5.0–8.0)
PROTEIN: 30 mg/dL — AB
SPECIFIC GRAVITY, URINE: 1.033 — AB (ref 1.005–1.030)

## 2015-07-03 LAB — BASIC METABOLIC PANEL
ANION GAP: 6 (ref 5–15)
BUN: 13 mg/dL (ref 6–20)
CHLORIDE: 105 mmol/L (ref 101–111)
CO2: 27 mmol/L (ref 22–32)
Calcium: 8.2 mg/dL — ABNORMAL LOW (ref 8.9–10.3)
Creatinine, Ser: 1.03 mg/dL (ref 0.61–1.24)
GFR calc Af Amer: >60 mL/min (ref >60)
GLUCOSE: 124 mg/dL — AB (ref 65–99)
POTASSIUM: 3.5 mmol/L (ref 3.5–5.1)
Sodium: 138 mmol/L (ref 135–145)

## 2015-07-03 LAB — GLUCOSE, CAPILLARY
GLUCOSE-CAPILLARY: 141 mg/dL — AB (ref 65–99)
GLUCOSE-CAPILLARY: 118 mg/dL — AB (ref 65–99)
Glucose-Capillary: 122 mg/dL — ABNORMAL HIGH (ref 65–99)
Glucose-Capillary: 144 mg/dL — ABNORMAL HIGH (ref 65–99)
Glucose-Capillary: 145 mg/dL — ABNORMAL HIGH (ref 65–99)
Glucose-Capillary: 185 mg/dL — ABNORMAL HIGH (ref 65–99)
Glucose-Capillary: 202 mg/dL — ABNORMAL HIGH (ref 65–99)

## 2015-07-03 LAB — URINE MICROSCOPIC-ADD ON: RBC / HPF: NONE SEEN RBC/hpf (ref 0–5)

## 2015-07-03 LAB — MAGNESIUM: MAGNESIUM: 2.2 mg/dL (ref 1.7–2.4)

## 2015-07-03 MED ORDER — FAT EMULSION 20 % IV EMUL
240.0000 mL | INTRAVENOUS | Status: AC
  Administered 2015-07-03: 240 mL via INTRAVENOUS
  Filled 2015-07-03: qty 250

## 2015-07-03 MED ORDER — TRACE MINERALS CR-CU-MN-SE-ZN 10-1000-500-60 MCG/ML IV SOLN
INTRAVENOUS | Status: AC
  Administered 2015-07-03: 18:00:00 via INTRAVENOUS
  Filled 2015-07-03: qty 1440

## 2015-07-03 MED ORDER — POTASSIUM CHLORIDE 10 MEQ/50ML IV SOLN
10.0000 meq | INTRAVENOUS | Status: AC
  Administered 2015-07-03 (×2): 10 meq via INTRAVENOUS
  Filled 2015-07-03 (×2): qty 50

## 2015-07-03 MED ORDER — TRACE MINERALS CR-CU-MN-SE-ZN 10-1000-500-60 MCG/ML IV SOLN
INTRAVENOUS | Status: DC
  Filled 2015-07-03: qty 1440

## 2015-07-03 MED ORDER — FAT EMULSION 20 % IV EMUL
120.0000 mL | INTRAVENOUS | Status: DC
  Filled 2015-07-03 (×2): qty 200

## 2015-07-03 NOTE — Progress Notes (Signed)
5 Days Post-Op  Subjective: Had to have ng tube replaced yesterday b/c of n/v.  States he has passed some gas.  Objective: Vital signs in last 24 hours: Temp:  [98.3 F (36.8 C)-101.2 F (38.4 C)] 98.3 F (36.8 C) (05/27 0447) Pulse Rate:  [76-105] 76 (05/27 0447) Resp:  [16-20] 18 (05/27 0800) BP: (106-125)/(64-87) 106/64 mmHg (05/27 0447) SpO2:  [94 %-98 %] 97 % (05/27 0800) FiO2 (%):  [34 %] 34 % (05/27 0800) Last BM Date: 06/21/15  Intake/Output from previous day: 05/26 0701 - 05/27 0700 In: 655 [I.V.:360; IV Piggyback:250; TPN:45] Out: 650 [Urine:450; Emesis/NG output:200] Intake/Output this shift:    PE: General- In NAD Abdomen-soft, no bowel sounds, wounds are clean and intact  Lab Results:  No results for input(s): WBC, HGB, HCT, PLT in the last 72 hours. BMET  Recent Labs  07/01/15 0741 07/02/15 0520 07/02/15 2000  NA 135 137  --   K 3.2* 3.0* 4.0  CL 101 102  --   CO2 26 28  --   GLUCOSE 200* 153*  --   BUN 8 8  --   CREATININE 1.00 0.89  --   CALCIUM 8.5* 8.5*  --    PT/INR No results for input(s): LABPROT, INR in the last 72 hours. Comprehensive Metabolic Panel:    Component Value Date/Time   NA 137 07/02/2015 0520   NA 135 07/01/2015 0741   K 4.0 07/02/2015 2000   K 3.0* 07/02/2015 0520   CL 102 07/02/2015 0520   CL 101 07/01/2015 0741   CO2 28 07/02/2015 0520   CO2 26 07/01/2015 0741   BUN 8 07/02/2015 0520   BUN 8 07/01/2015 0741   CREATININE 0.89 07/02/2015 0520   CREATININE 1.00 07/01/2015 0741   GLUCOSE 153* 07/02/2015 0520   GLUCOSE 200* 07/01/2015 0741   CALCIUM 8.5* 07/02/2015 0520   CALCIUM 8.5* 07/01/2015 0741   AST 14* 07/01/2015 0741   AST 14* 06/29/2015 0503   ALT 9* 07/01/2015 0741   ALT 11* 06/29/2015 0503   ALKPHOS 59 07/01/2015 0741   ALKPHOS 63 06/29/2015 0503   BILITOT 0.5 07/01/2015 0741   BILITOT 1.3* 06/29/2015 0503   PROT 6.0* 07/01/2015 0741   PROT 6.5 06/29/2015 0503   ALBUMIN 2.6* 07/01/2015 0741   ALBUMIN 3.0* 06/29/2015 0503     Studies/Results: No results found.  Anti-infectives: Anti-infectives    Start     Dose/Rate Route Frequency Ordered Stop   06/28/15 1000  [MAR Hold]  cefoTEtan (CEFOTAN) 2 g in dextrose 5 % 50 mL IVPB  Status:  Discontinued     (MAR Hold since 06/28/15 1059)   2 g 100 mL/hr over 30 Minutes Intravenous Every 12 hours 06/28/15 0903 06/28/15 1513      Assessment Active Problems:   Small bowel obstruction (HCC) s/p laparoscopy->expl lap, LOA 06/28/15 (Dr. Jarome LamasBlackman)-has a postop ileus  PC malnutrition with Prealbumin 10.9-on TPN  Fever yesterday evening-UA negative; no cough; WBC pending.  Plan:  Wait for ileus to resolve.  Check lab.  Ambulate.    LOS: 8 days   Xochil Shanker J 07/03/2015

## 2015-07-03 NOTE — Progress Notes (Signed)
PARENTERAL NUTRITION CONSULT NOTE - FOLLOW UP  Pharmacy Consult for TPN Indication: Prolonged ileus  No Known Allergies  Patient Measurements: Height: 5\' 11"  (180.3 cm) Weight: 188 lb (85.276 kg) IBW/kg (Calculated) : 75.3  Vital Signs: Temp: 98.3 F (36.8 C) (05/27 0447) Temp Source: Oral (05/27 0447) BP: 106/64 mmHg (05/27 0447) Pulse Rate: 76 (05/27 0447) Intake/Output from previous day: 05/26 0701 - 05/27 0700 In: 655 [I.V.:360; IV Piggyback:250; TPN:45] Out: 650 [Urine:450; Emesis/NG output:200] Intake/Output from this shift:    Labs: No results for input(s): WBC, HGB, HCT, PLT, APTT, INR in the last 72 hours.   Recent Labs  07/01/15 0741 07/02/15 0520 07/02/15 2000  NA 135 137  --   K 3.2* 3.0* 4.0  CL 101 102  --   CO2 26 28  --   GLUCOSE 200* 153*  --   BUN 8 8  --   CREATININE 1.00 0.89  --   CALCIUM 8.5* 8.5*  --   MG 1.9 2.0  --   PHOS 2.6 3.1  --   PROT 6.0*  --   --   ALBUMIN 2.6*  --   --   AST 14*  --   --   ALT 9*  --   --   ALKPHOS 59  --   --   BILITOT 0.5  --   --    Estimated Creatinine Clearance: 92.8 mL/min (by C-G formula based on Cr of 0.89).    Recent Labs  07/03/15 0022 07/03/15 0445 07/03/15 0723  GLUCAP 185* 202* 141*    Medications:  Scheduled:  . enoxaparin (LOVENOX) injection  40 mg Subcutaneous Q24H  . HYDROmorphone   Intravenous Q4H  . insulin aspart  0-15 Units Subcutaneous Q4H  . metoCLOPramide (REGLAN) injection  10 mg Intravenous Q6H  . pantoprazole (PROTONIX) IV  40 mg Intravenous QHS    Insulin Requirements in the past 24 hours:  24 units mod SSI  Admit: [age] YOM presented with a four-day history of progressive abdominal pain/distention, nausea and vomiting. Found to have SBO and underwent ex-lap with LOA on 06/28/15 given no improvement with conservative management. Pharmacy consulted to initiate TPN for expected prolonged ileus.  Surgeries/Procedures:  5/22: ex lap with LOA  GI:  PPI IV. Baseline  prealbumin 10. abd soft & non-distended.  Last BM 5/15, NG tube replaced yesterday due to n/v. Passed some gas  Endo: DM with A1c 10% - not on any medications PTA. CBGs 141-209.   Lytes: K 3.5 (goal >4)- s/p K runs x 4 yesterday, phos 3.1, Mg 2.2 (goal >2), CorCa ~9.6. Possible refeeding syndrome given fall in K despite supplementation.    Renal: SCr wnl - NS w/ 20k @ 85 ml/hr. UOP down to 0.252ml/kg/hr  Pulm: on RA   Cards: no hx. VSS  Hepatobil: LFTs WNL, tbili mildly elevated at 1.3, lipase WNL. Baseline trigs 77  Neuro: Dilaudid PCA- 13mg /24hr, pain score 4-5  ID:  Febrile overnight, WBC WNL - not on abx  Best Practices: Lovenox; patient is not on medications PTA  TPN Access: PICC placed 5/23 TPN start date: 06/29/15  Current Nutrition:  NPO Clinimix E 5/15 at 4040mL/hr with 20% fat emulsion at 525mL/hr.  Goal rates are Clinimix 5/15 at 7096mL/hr + 20% fat emulsion at 1110mL/hr to provide 115g protein and 2116kcal which will meet 100% nutritional needs  Nutritional Goals: per RD 5/22 2100-2300 kCal 110-120 grams of protein per day  Plan:  -Repeat KCl 10mEq runs  IV x 2 -Increase Clinimix E 5/15 to 36mL/hr and 20% fat emulsion at 37ml/hr  -Multivitamin and trace elements to be added to TPN bag -Increase Insulin in TPN to 20 units -Decrease IV fluids to 65 mL/hr to accommodate TPN.  -BMet, Mg, Phos in AM -TPN labs qMon/Thurs   Vinnie Level, PharmD., BCPS Clinical Pharmacist Pager [phone]

## 2015-07-04 LAB — BASIC METABOLIC PANEL
Anion gap: 8 (ref 5–15)
BUN: 8 mg/dL (ref 6–20)
CHLORIDE: 105 mmol/L (ref 101–111)
CO2: 26 mmol/L (ref 22–32)
Calcium: 8.4 mg/dL — ABNORMAL LOW (ref 8.9–10.3)
Creatinine, Ser: 0.87 mg/dL (ref 0.61–1.24)
GFR calc non Af Amer: >60 mL/min (ref >60)
Glucose, Bld: 120 mg/dL — ABNORMAL HIGH (ref 65–99)
POTASSIUM: 3.4 mmol/L — AB (ref 3.5–5.1)
SODIUM: 139 mmol/L (ref 135–145)

## 2015-07-04 LAB — MAGNESIUM: MAGNESIUM: 1.9 mg/dL (ref 1.7–2.4)

## 2015-07-04 LAB — GLUCOSE, CAPILLARY
GLUCOSE-CAPILLARY: 159 mg/dL — AB (ref 65–99)
GLUCOSE-CAPILLARY: 132 mg/dL — AB (ref 65–99)
GLUCOSE-CAPILLARY: 146 mg/dL — AB (ref 65–99)
GLUCOSE-CAPILLARY: 132 mg/dL — AB (ref 65–99)
Glucose-Capillary: 163 mg/dL — ABNORMAL HIGH (ref 65–99)
Glucose-Capillary: 205 mg/dL — ABNORMAL HIGH (ref 65–99)

## 2015-07-04 LAB — PHOSPHORUS: PHOSPHORUS: 2.8 mg/dL (ref 2.5–4.6)

## 2015-07-04 MED ORDER — POTASSIUM CHLORIDE IN NACL 20-0.9 MEQ/L-% IV SOLN: INTRAVENOUS | Status: AC

## 2015-07-04 MED ORDER — MAGNESIUM SULFATE 2 GM/50ML IV SOLN
2.0000 g | Freq: Once | INTRAVENOUS | Status: AC
  Administered 2015-07-04: 2 g via INTRAVENOUS
  Filled 2015-07-04: qty 50

## 2015-07-04 MED ORDER — SODIUM PHOSPHATES 45 MMOLE/15ML IV SOLN
20.0000 mmol | Freq: Once | INTRAVENOUS | Status: AC
  Administered 2015-07-04: 20 mmol via INTRAVENOUS
  Filled 2015-07-04: qty 6.67

## 2015-07-04 MED ORDER — TRACE MINERALS CR-CU-MN-SE-ZN 10-1000-500-60 MCG/ML IV SOLN
INTRAVENOUS | Status: AC
  Administered 2015-07-04: 18:00:00 via INTRAVENOUS
  Filled 2015-07-04: qty 1992

## 2015-07-04 MED ORDER — FAT EMULSION 20 % IV EMUL
240.0000 mL | INTRAVENOUS | Status: AC
  Administered 2015-07-04: 240 mL via INTRAVENOUS
  Filled 2015-07-04: qty 250

## 2015-07-04 MED ORDER — POTASSIUM CHLORIDE 10 MEQ/100ML IV SOLN: 10.0000 meq | INTRAVENOUS | Status: DC

## 2015-07-04 MED ORDER — POTASSIUM CHLORIDE 10 MEQ/50ML IV SOLN
10.0000 meq | INTRAVENOUS | Status: AC
  Administered 2015-07-04 (×3): 10 meq via INTRAVENOUS
  Filled 2015-07-04 (×3): qty 50

## 2015-07-04 NOTE — Progress Notes (Signed)
PARENTERAL NUTRITION CONSULT NOTE - FOLLOW UP  Pharmacy Consult for TPN Indication: Prolonged ileus  No Known Allergies  Patient Measurements: Height: 5\' 11"  (180.3 cm) Weight: 188 lb (85.276 kg) IBW/kg (Calculated) : 75.3  Vital Signs: Temp: 98.2 F (36.8 C) (05/28 0419) Temp Source: Oral (05/28 0419) BP: 122/83 mmHg (05/28 0419) Pulse Rate: 69 (05/28 0419) Intake/Output from previous day: 05/27 0701 - 05/28 0700 In: 1992 [P.O.:60; I.V.:1030; TPN:902] Out: 1210 [Urine:460; Emesis/NG output:750] Intake/Output from this shift:    Labs:  Recent Labs  07/03/15 0830  WBC 5.0  HGB 11.3*  HCT 33.6*  PLT 197     Recent Labs  07/01/15 0741 07/02/15 0520 07/02/15 2000 07/03/15 0830 07/04/15 0636  NA 135 137  --  138 139  K 3.2* 3.0* 4.0 3.5 3.4*  CL 101 102  --  105 105  CO2 26 28  --  27 26  GLUCOSE 200* 153*  --  124* 120*  BUN 8 8  --  13 8  CREATININE 1.00 0.89  --  1.03 0.87  CALCIUM 8.5* 8.5*  --  8.2* 8.4*  MG 1.9 2.0  --  2.2 1.9  PHOS 2.6 3.1  --   --  2.8  PROT 6.0*  --   --   --   --   ALBUMIN 2.6*  --   --   --   --   AST 14*  --   --   --   --   ALT 9*  --   --   --   --   ALKPHOS 59  --   --   --   --   BILITOT 0.5  --   --   --   --    Estimated Creatinine Clearance: 95 mL/min (by C-G formula based on Cr of 0.87).    Recent Labs  07/03/15 2008 07/03/15 2353 07/04/15 0418  GLUCAP 144* 122* 159*    Medications:  Scheduled:  . enoxaparin (LOVENOX) injection  40 mg Subcutaneous Q24H  . HYDROmorphone   Intravenous Q4H  . insulin aspart  0-15 Units Subcutaneous Q4H  . metoCLOPramide (REGLAN) injection  10 mg Intravenous Q6H  . pantoprazole (PROTONIX) IV  40 mg Intravenous QHS    Insulin Requirements in the past 24 hours:  11 units mod SSI  Admit: [age] YOM presented with a four-day history of progressive abdominal pain/distention, nausea and vomiting. Found to have SBO and underwent ex-lap with LOA on 06/28/15 given no improvement with  conservative management. Pharmacy consulted to initiate TPN for expected prolonged ileus.  Surgeries/Procedures:  5/22: ex lap with LOA  GI:  PPI IV. Baseline prealbumin 10. abd soft & non-distended.  Last BM 5/15, NG tube replaced yesterday due to n/v. Ileus improving. Passing gas. Eating a lot of ice. Advancing diet to clear liquid   Endo: DM with A1c 10% - not on any medications PTA. CBGs improved to 118-159.   Lytes: K 3.4 (goal >4), phos 2.8, Mg 1.9 (goal >2), CorCa ~9.6. Possible refeeding syndrome given fall in K despite supplementation.    Renal: SCr wnl - NS w/ 20k @ 85 ml/hr. UOP down to 0.482ml/kg/hr  Pulm: on RA   Cards: no hx. VSS  Hepatobil: LFTs WNL, tbili mildly elevated at 1.3, lipase WNL. Baseline trigs 77  Neuro: Dilaudid PCA- 12mg /24hr, pain score 2-5  ID:  Fever resolved, WBC WNL - not on abx  Best Practices: Lovenox; patient is not on medications  PTA  TPN Access: PICC placed 5/23 TPN start date: 06/29/15  Current Nutrition:  NPO Clinimix E 5/15 at 58mL/hr with 20% fat emulsion at 74mL/hr.  Goal rates are Clinimix 5/15 at 26mL/hr + 20% fat emulsion at 63mL/hr to provide 115g protein and 2116kcal which will meet 100% nutritional needs  Nutritional Goals: per RD 5/22 2100-2300 kCal 110-120 grams of protein per day  Plan:  -Repeat KCl runs IV x 3 -Magnesium sulfate 1 gm IV  -Sodium phosphate 20 mmol IV  -Increase Clinimix E 5/15 to 82mL/hr and 20% fat emulsion at 63ml/hr  -Multivitamin and trace elements to be added to TPN bag -Continue 20 units of regular Insulin in TPN. Monitor CBGs as TPN is advanced  -Decrease IV fluids to 45 mL/hr to accommodate TPN.  -BMet, Mg, Phos in AM -TPN labs qMon/Thurs -F/u tolerance to clear liquid diet and plans for diet advancement  -F/u lytes tomorrow    Vinnie Level, PharmD., BCPS Clinical Pharmacist Pager [phone]

## 2015-07-04 NOTE — Progress Notes (Signed)
6 Days Post-Op  Subjective: Passing a lot of gas.  No BM.  Eating a lot of ice.  Objective: Vital signs in last 24 hours: Temp:  [97.9 F (36.6 C)-98.2 F (36.8 C)] 98.2 F (36.8 C) (05/28 0419) Pulse Rate:  [69-74] 69 (05/28 0419) Resp:  [14-19] 14 (05/28 0425) BP: (108-122)/(70-83) 122/83 mmHg (05/28 0419) SpO2:  [95 %-97 %] 95 % (05/28 0425) FiO2 (%):  [34 %] 34 % (05/27 0800) Last BM Date: 06/21/15  Intake/Output from previous day: 05/27 0701 - 05/28 0700 In: 1992 [P.O.:60; I.V.:1030; TPN:902] Out: 1210 [Urine:460; Emesis/NG output:750] Intake/Output this shift:    PE: General- In NAD Abdomen-soft, few bowel sounds, wounds are clean and intact  Lab Results:   Recent Labs  07/03/15 0830  WBC 5.0  HGB 11.3*  HCT 33.6*  PLT 197   BMET  Recent Labs  07/03/15 0830 07/04/15 0636  NA 138 139  K 3.5 3.4*  CL 105 105  CO2 27 26  GLUCOSE 124* 120*  BUN 13 8  CREATININE 1.03 0.87  CALCIUM 8.2* 8.4*   PT/INR No results for input(s): LABPROT, INR in the last 72 hours. Comprehensive Metabolic Panel:    Component Value Date/Time   NA 139 07/04/2015 0636   NA 138 07/03/2015 0830   K 3.4* 07/04/2015 0636   K 3.5 07/03/2015 0830   CL 105 07/04/2015 0636   CL 105 07/03/2015 0830   CO2 26 07/04/2015 0636   CO2 27 07/03/2015 0830   BUN 8 07/04/2015 0636   BUN 13 07/03/2015 0830   CREATININE 0.87 07/04/2015 0636   CREATININE 1.03 07/03/2015 0830   GLUCOSE 120* 07/04/2015 0636   GLUCOSE 124* 07/03/2015 0830   CALCIUM 8.4* 07/04/2015 0636   CALCIUM 8.2* 07/03/2015 0830   AST 14* 07/01/2015 0741   AST 14* 06/29/2015 0503   ALT 9* 07/01/2015 0741   ALT 11* 06/29/2015 0503   ALKPHOS 59 07/01/2015 0741   ALKPHOS 63 06/29/2015 0503   BILITOT 0.5 07/01/2015 0741   BILITOT 1.3* 06/29/2015 0503   PROT 6.0* 07/01/2015 0741   PROT 6.5 06/29/2015 0503   ALBUMIN 2.6* 07/01/2015 0741   ALBUMIN 3.0* 06/29/2015 0503     Studies/Results: No results  found.  Anti-infectives: Anti-infectives    Start     Dose/Rate Route Frequency Ordered Stop   06/28/15 1000  [MAR Hold]  cefoTEtan (CEFOTAN) 2 g in dextrose 5 % 50 mL IVPB  Status:  Discontinued     (MAR Hold since 06/28/15 1059)   2 g 100 mL/hr over 30 Minutes Intravenous Every 12 hours 06/28/15 0903 06/28/15 1513      Assessment Active Problems:   Small bowel obstruction (HCC) s/p laparoscopy->expl lap, LOA 06/28/15 (Dr. Magnus IvanBlackman)- postop ileus improving  PC malnutrition with Prealbumin 10.9-on TPN  Fever 5/26 evening-UA negative; WBC normal; no further fever  Hypokalemia-mild  Plan:  Replace potassium, clamp ng tube, clear liquid diet.    LOS: 9 days   Jamielynn Wigley J 07/04/2015

## 2015-07-04 NOTE — Progress Notes (Signed)
Pt.'s NGT has been clamped; no complaints of nausea/vomiting.

## 2015-07-05 LAB — CBC
HEMATOCRIT: 35.9 % — AB (ref 39.0–52.0)
Hemoglobin: 12 g/dL — ABNORMAL LOW (ref 13.0–17.0)
MCH: 31.3 pg (ref 26.0–34.0)
MCHC: 33.4 g/dL (ref 30.0–36.0)
MCV: 93.5 fL (ref 78.0–100.0)
Platelets: 217 10*3/uL (ref 150–400)
RBC: 3.84 MIL/uL — ABNORMAL LOW (ref 4.22–5.81)
RDW: 12.3 % (ref 11.5–15.5)
WBC: 6.8 10*3/uL (ref 4.0–10.5)

## 2015-07-05 LAB — DIFFERENTIAL
Basophils Absolute: 0.1 10*3/uL (ref 0.0–0.1)
Basophils Relative: 1 %
EOS PCT: 1 %
Eosinophils Absolute: 0.1 10*3/uL (ref 0.0–0.7)
LYMPHS PCT: 25 %
Lymphs Abs: 1.7 10*3/uL (ref 0.7–4.0)
MONOS PCT: 16 %
Monocytes Absolute: 1.1 10*3/uL — ABNORMAL HIGH (ref 0.1–1.0)
NEUTROS ABS: 3.8 10*3/uL (ref 1.7–7.7)
Neutrophils Relative %: 57 %

## 2015-07-05 LAB — COMPREHENSIVE METABOLIC PANEL
ALBUMIN: 2.5 g/dL — AB (ref 3.5–5.0)
ALK PHOS: 66 U/L (ref 38–126)
ALT: 12 U/L — AB (ref 17–63)
AST: 15 U/L (ref 15–41)
Anion gap: 7 (ref 5–15)
BILIRUBIN TOTAL: 0.7 mg/dL (ref 0.3–1.2)
BUN: 8 mg/dL (ref 6–20)
CALCIUM: 8.6 mg/dL — AB (ref 8.9–10.3)
CO2: 26 mmol/L (ref 22–32)
CREATININE: 1.04 mg/dL (ref 0.61–1.24)
Chloride: 103 mmol/L (ref 101–111)
GFR calc Af Amer: >60 mL/min (ref >60)
GFR calc non Af Amer: >60 mL/min (ref >60)
GLUCOSE: 180 mg/dL — AB (ref 65–99)
Potassium: 3.7 mmol/L (ref 3.5–5.1)
SODIUM: 136 mmol/L (ref 135–145)
Total Protein: 5.6 g/dL — ABNORMAL LOW (ref 6.5–8.1)

## 2015-07-05 LAB — PREALBUMIN: Prealbumin: 8.4 mg/dL — ABNORMAL LOW (ref 18–38)

## 2015-07-05 LAB — GLUCOSE, CAPILLARY
GLUCOSE-CAPILLARY: 165 mg/dL — AB (ref 65–99)
GLUCOSE-CAPILLARY: 172 mg/dL — AB (ref 65–99)
GLUCOSE-CAPILLARY: 142 mg/dL — AB (ref 65–99)
Glucose-Capillary: 132 mg/dL — ABNORMAL HIGH (ref 65–99)
Glucose-Capillary: 188 mg/dL — ABNORMAL HIGH (ref 65–99)
Glucose-Capillary: 210 mg/dL — ABNORMAL HIGH (ref 65–99)

## 2015-07-05 LAB — TRIGLYCERIDES: TRIGLYCERIDES: 181 mg/dL — AB (ref <150)

## 2015-07-05 LAB — PHOSPHORUS: Phosphorus: 4.7 mg/dL — ABNORMAL HIGH (ref 2.5–4.6)

## 2015-07-05 LAB — MAGNESIUM: Magnesium: 2 mg/dL (ref 1.7–2.4)

## 2015-07-05 MED ORDER — FAT EMULSION 20 % IV EMUL
240.0000 mL | INTRAVENOUS | Status: DC
  Filled 2015-07-05: qty 250

## 2015-07-05 MED ORDER — TRACE MINERALS CR-CU-MN-SE-ZN 10-1000-500-60 MCG/ML IV SOLN
INTRAVENOUS | Status: DC
  Administered 2015-07-05: 17:00:00 via INTRAVENOUS
  Filled 2015-07-05: qty 1920

## 2015-07-05 MED ORDER — TRACE MINERALS CR-CU-MN-SE-ZN 10-1000-500-60 MCG/ML IV SOLN
INTRAVENOUS | Status: DC
  Filled 2015-07-05: qty 1920

## 2015-07-05 MED ORDER — POTASSIUM CHLORIDE 10 MEQ/50ML IV SOLN
10.0000 meq | INTRAVENOUS | Status: AC
  Administered 2015-07-05 (×3): 10 meq via INTRAVENOUS
  Filled 2015-07-05 (×3): qty 50

## 2015-07-05 MED ORDER — POTASSIUM CHLORIDE IN NACL 40-0.9 MEQ/L-% IV SOLN
INTRAVENOUS | Status: DC
  Administered 2015-07-05: 10 mL/h via INTRAVENOUS
  Filled 2015-07-05: qty 1000

## 2015-07-05 MED ORDER — FAT EMULSION 20 % IV EMUL
240.0000 mL | INTRAVENOUS | Status: DC
  Administered 2015-07-05: 240 mL via INTRAVENOUS
  Filled 2015-07-05 (×2): qty 250

## 2015-07-05 MED ORDER — HYDROCODONE-ACETAMINOPHEN 5-325 MG PO TABS
1.0000 | ORAL_TABLET | ORAL | Status: DC | PRN
  Administered 2015-07-05: 2 via ORAL
  Filled 2015-07-05: qty 2

## 2015-07-05 NOTE — Progress Notes (Signed)
Pt had a large amount of bowel movement. Stool was a mix of lumps of formed fecal matter and very watery and pale brown in color

## 2015-07-05 NOTE — Progress Notes (Signed)
Pt. Stated he had 2 bowel movement witch were watery and solid.

## 2015-07-05 NOTE — Progress Notes (Signed)
Pt. Took a bath and refused linen change

## 2015-07-05 NOTE — Progress Notes (Signed)
7 Days Post-Op  Subjective: Continues to pass gas but no BM.  Tolerating clear liquids with ng tube clamped.  Objective: Vital signs in last 24 hours: Temp:  [97.6 F (36.4 C)-99.5 F (37.5 C)] 99.5 F (37.5 C) (05/29 0438) Pulse Rate:  [69-79] 79 (05/29 0438) Resp:  [16-18] 18 (05/29 0807) BP: (120-138)/(74-84) 127/74 mmHg (05/29 0438) SpO2:  [95 %-99 %] 99 % (05/29 0807) Last BM Date: 06/21/15  Intake/Output from previous day: 05/28 0701 - 05/29 0700 In: 2579 [P.O.:880; I.V.:480; TPN:1219] Out: 1450 [Urine:1250; Emesis/NG output:200] Intake/Output this shift:    PE: General- In NAD Abdomen-soft, few bowel sounds, wounds are clean and intact  Lab Results:   Recent Labs  07/03/15 0830 07/05/15 0559  WBC 5.0 6.8  HGB 11.3* 12.0*  HCT 33.6* 35.9*  PLT 197 217   BMET  Recent Labs  07/04/15 0636 07/05/15 0559  NA 139 136  K 3.4* 3.7  CL 105 103  CO2 26 26  GLUCOSE 120* 180*  BUN 8 8  CREATININE 0.87 1.04  CALCIUM 8.4* 8.6*   PT/INR No results for input(s): LABPROT, INR in the last 72 hours. Comprehensive Metabolic Panel:    Component Value Date/Time   NA 136 07/05/2015 0559   NA 139 07/04/2015 0636   K 3.7 07/05/2015 0559   K 3.4* 07/04/2015 0636   CL 103 07/05/2015 0559   CL 105 07/04/2015 0636   CO2 26 07/05/2015 0559   CO2 26 07/04/2015 0636   BUN 8 07/05/2015 0559   BUN 8 07/04/2015 0636   CREATININE 1.04 07/05/2015 0559   CREATININE 0.87 07/04/2015 0636   GLUCOSE 180* 07/05/2015 0559   GLUCOSE 120* 07/04/2015 0636   CALCIUM 8.6* 07/05/2015 0559   CALCIUM 8.4* 07/04/2015 0636   AST 15 07/05/2015 0559   AST 14* 07/01/2015 0741   ALT 12* 07/05/2015 0559   ALT 9* 07/01/2015 0741   ALKPHOS 66 07/05/2015 0559   ALKPHOS 59 07/01/2015 0741   BILITOT 0.7 07/05/2015 0559   BILITOT 0.5 07/01/2015 0741   PROT 5.6* 07/05/2015 0559   PROT 6.0* 07/01/2015 0741   ALBUMIN 2.5* 07/05/2015 0559   ALBUMIN 2.6* 07/01/2015 0741      Studies/Results: No results found.  Anti-infectives: Anti-infectives    Start     Dose/Rate Route Frequency Ordered Stop   06/28/15 1000  [MAR Hold]  cefoTEtan (CEFOTAN) 2 g in dextrose 5 % 50 mL IVPB  Status:  Discontinued     (MAR Hold since 06/28/15 1059)   2 g 100 mL/hr over 30 Minutes Intravenous Every 12 hours 06/28/15 0903 06/28/15 1513      Assessment Active Problems:   Small bowel obstruction (HCC) s/p laparoscopy->expl lap, LOA 06/28/15 (Dr. Magnus IvanBlackman)- postop ileus improving   PC malnutrition with Prealbumin 10.9-on TPN  Fever 5/26 evening-UA negative; WBC normal; no further fever  Hypokalemia-resolved.  Plan:  Remove ng tube.  Soft diet.  D/C PCA.    LOS: 10 days   Justin Yang 07/05/2015

## 2015-07-05 NOTE — Progress Notes (Signed)
PARENTERAL NUTRITION CONSULT NOTE - FOLLOW UP  Pharmacy Consult for TPN Indication: Prolonged ileus  No Known Allergies  Patient Measurements: Height: 5\' 11"  (180.3 cm) Weight: 188 lb (85.276 kg) IBW/kg (Calculated) : 75.3   Vital Signs: Temp: 99.5 F (37.5 C) (05/29 0438) Temp Source: Oral (05/29 0438) BP: 127/74 mmHg (05/29 0438) Pulse Rate: 79 (05/29 0438) Intake/Output from previous day: 05/28 0701 - 05/29 0700 In: 2579 [P.O.:880; I.V.:480; TPN:1219] Out: 1450 [Urine:1250; Emesis/NG output:200] Intake/Output from this shift:    Labs:  Recent Labs  07/03/15 0830 07/05/15 0559  WBC 5.0 6.8  HGB 11.3* 12.0*  HCT 33.6* 35.9*  PLT 197 217     Recent Labs  07/03/15 0830 07/04/15 0636 07/05/15 0559  NA 138 139 136  K 3.5 3.4* 3.7  CL 105 105 103  CO2 27 26 26   GLUCOSE 124* 120* 180*  BUN 13 8 8   CREATININE 1.03 0.87 1.04  CALCIUM 8.2* 8.4* 8.6*  MG 2.2 1.9 2.0  PHOS  --  2.8 4.7*  PROT  --   --  5.6*  ALBUMIN  --   --  2.5*  AST  --   --  15  ALT  --   --  12*  ALKPHOS  --   --  66  BILITOT  --   --  0.7  PREALBUMIN  --   --  8.4*  TRIG  --   --  181*   Estimated Creatinine Clearance: 79.4 mL/min (by C-G formula based on Cr of 1.04).    Recent Labs  07/05/15 0027 07/05/15 0400 07/05/15 0804  GLUCAP 132* 165* 172*    Medications:  Scheduled:  . enoxaparin (LOVENOX) injection  40 mg Subcutaneous Q24H  . HYDROmorphone   Intravenous Q4H  . insulin aspart  0-15 Units Subcutaneous Q4H  . metoCLOPramide (REGLAN) injection  10 mg Intravenous Q6H  . pantoprazole (PROTONIX) IV  40 mg Intravenous QHS    Insulin Requirements in the past 24 hours:  9 units mod SSI (since insulin increased in TPN), 20 units in TPN  Admit: [age] YOM presented with a four-day history of progressive abdominal pain/distention, nausea and vomiting. Found to have SBO and underwent ex-lap with LOA on 06/28/15 given no improvement with conservative management. Pharmacy  consulted to initiate TPN for expected prolonged ileus.  Surgeries/Procedures:  5/22: ex lap with LOA  GI: PPI IV. Baseline prealbumin 10. abd soft & non-distended. Last BM 5/15, NGT clamped 5/28 afternoon, (-)N/V. Passed some gas.  Reglan, PPI.  Endo: DM with A1c 10% - not on any medications PTA. CBGs 132-172 + 205 x 1.   Lytes: K 3.7 (goal >4)- s/p K runs x 3 yest, phos 4.7- s/p NaPhos 20 mmol yest, Mg 2 (goal >2)- s/p Mg 2g yest, CorCa ~9.8. Possible refeeding syndrome given fall in K despite supplementation. NS + 20K at 5745ml/hr  Renal: SCr wnl - NS w/ 20k @ 85 ml/hr. UOP down to 0.212ml/kg/hr  Pulm: on RA   Cards: no hx. VSS  Hepatobil:  LFTs WNL, tbili mildly elevated at 1.3, lipase WNL. Baseline trigs 77 -> 181 today.  PAlb 8.4.  Neuro: Dilaudid PCA- 3.3mg /24hr, pain score 4-5.  Decreased PCA usage  ID: AFeb, WBC WNL - not on abx  Best Practices: Lovenox; patient is not on medications PTA  TPN Access: PICC placed 5/23 TPN start date: 06/29/15  Current Nutrition:  Clear liquids Goal: Clinimix 5/15 at 9296mL/hr + 20% fat emulsion at 2910mL/hr  to provide 115g protein and 2116kcal which will meet 100% nutritional needs  Nutritional Goals: per RD 5/22 2100-2300 kCal 110-120 grams of protein per day  Plan:  -Repeat KCl runs IV x 3 -Increase Clinimix E 5/15 to 42mL/hr and 20% fat emulsion at 83ml/hr  -Multivitamin and trace elements to be added to TPN bag -Add 20 units Insulin to TPN -Change IVF to NS + 40K at [facility] to maintain current K provision and accommodate new TPN rate -F/U advancement of diet and wean TPN as able -BMet, Mg in AM -TPN labs qMon/Thurs  Marisue Humble, PharmD Clinical Pharmacist [facility]

## 2015-07-06 LAB — BASIC METABOLIC PANEL
ANION GAP: 9 (ref 5–15)
BUN: 11 mg/dL (ref 6–20)
CO2: 25 mmol/L (ref 22–32)
Calcium: 8.5 mg/dL — ABNORMAL LOW (ref 8.9–10.3)
Chloride: 101 mmol/L (ref 101–111)
Creatinine, Ser: 0.9 mg/dL (ref 0.61–1.24)
GLUCOSE: 154 mg/dL — AB (ref 65–99)
POTASSIUM: 3.7 mmol/L (ref 3.5–5.1)
SODIUM: 135 mmol/L (ref 135–145)

## 2015-07-06 LAB — GLUCOSE, CAPILLARY
GLUCOSE-CAPILLARY: 161 mg/dL — AB (ref 65–99)
GLUCOSE-CAPILLARY: 167 mg/dL — AB (ref 65–99)
GLUCOSE-CAPILLARY: 188 mg/dL — AB (ref 65–99)
GLUCOSE-CAPILLARY: 198 mg/dL — AB (ref 65–99)

## 2015-07-06 LAB — MAGNESIUM: MAGNESIUM: 1.8 mg/dL (ref 1.7–2.4)

## 2015-07-06 MED ORDER — POTASSIUM CHLORIDE 10 MEQ/50ML IV SOLN
10.0000 meq | INTRAVENOUS | Status: AC
  Administered 2015-07-06 (×3): 10 meq via INTRAVENOUS
  Filled 2015-07-06 (×3): qty 50

## 2015-07-06 MED ORDER — HYDROCODONE-ACETAMINOPHEN 5-325 MG PO TABS: 1.0000 | ORAL_TABLET | Freq: Four times a day (QID) | ORAL | Status: DC | PRN

## 2015-07-06 MED ORDER — MAGNESIUM SULFATE 2 GM/50ML IV SOLN
2.0000 g | Freq: Once | INTRAVENOUS | Status: AC
  Administered 2015-07-06: 2 g via INTRAVENOUS
  Filled 2015-07-06: qty 50

## 2015-07-06 NOTE — Progress Notes (Signed)
Pt scheduled for discharge home this pm. Currently receiving 3rd run of potassium.

## 2015-07-06 NOTE — Discharge Summary (Signed)
Physician Discharge Summary  Justin Yang:[id number] DOB: [birth year] DOA: 06/25/2015  PCP: No primary care provider on file.  Consultation: none  Admit date: 06/25/2015 Discharge date: 07/06/2015  Recommendations for Outpatient Follow-up:    Follow-up Information    Follow up with [facility] A, MD. Schedule an appointment as soon as possible for a visit in 2 weeks.   Specialty:  General Surgery   Why:  For wound re-check   Contact information:   [address] [place] [address] [phone]       Follow up with primary care doctor.   Why:  follow up hospitalizaton, diabetes     Discharge Diagnoses:  1. Small bowel obstruction 2. PCM 3. DM II   Surgical Procedure: Diagnostic laparoscopy, exploratory laparotomy, LOA---Dr. Magnus Ivan 06/28/15   Discharge Condition: stable Disposition: home  Diet recommendation: regular  Filed Weights   06/25/15 0613  Weight: 85.276 kg (188 lb)     Filed Vitals:   07/05/15 2121 07/06/15 0427  BP: 120/72 102/65  Pulse: 79 89  Temp: 98.3 F (36.8 C) 99.3 F (37.4 C)  Resp: 17 18     Hospital Course:  Justin Yang is a 60 year old male who presented with abdominal distention, nausea and vomiting.  He was found to have a small bowel obstruction and admitted for NGT decompression, IVF and bowel rest.  He was started on the small bowel protocol and found to have a persistent small bowel obstruction.  He therefore underwent the procedure listed above.  He was started on TPN for an expected prolonged post operative ileus.  He complained of pharyngeal pain which resolved with time and removing the NGT.  As ileus resolved, diet was advanced.  He was maintained on a sliding scale insulin for diabetes.  He knows to follow up at the [facility] for ongoing management of diabetes. On POD#8 he was felt stable for discharge home. Medication risks, benefits and therapeutic alternatives were reviewed with the patient.  He verbalizes  understanding. He knows to call with questions or concerns.   Physical Exam: General: NAD.  VSS. Abdomen-staples were removed and steri strips were applied.  Abdomen is soft and non tender.  Discharge Instructions     Medication List    TAKE these medications        HYDROcodone-acetaminophen 5-325 MG tablet  Commonly known as:  NORCO/VICODIN  Take 1-2 tablets by mouth every 6 (six) hours as needed for moderate pain.           Follow-up Information    Follow up with [facility] A, MD. Schedule an appointment as soon as possible for a visit in 2 weeks.   Specialty:  General Surgery   Why:  For wound re-check   Contact information:   [address] [place] [address] [phone]       Follow up with primary care doctor.   Why:  follow up hospitalizaton, diabetes       The results of significant diagnostics from this hospitalization (including imaging, microbiology, ancillary and laboratory) are listed below for reference.    Significant Diagnostic Studies: Ct Abdomen Pelvis W Contrast  06/25/2015  CLINICAL DATA:  Mid abdominal pain with emesis, onset this week after taking laxatives for constipation. Pain is now all over the abdomen. EXAM: CT ABDOMEN AND PELVIS WITH CONTRAST TECHNIQUE: Multidetector CT imaging of the abdomen and pelvis was performed using the standard protocol following bolus administration of intravenous contrast. CONTRAST:  ISOVUE-300 IOPAMIDOL (ISOVUE-300) INJECTION 61% COMPARISON:  Abdominal series [address] FINDINGS: Atelectasis or infiltration in the lung bases. Mild diffuse fatty infiltration of the liver. The gallbladder, pancreas, spleen, adrenal glands, abdominal aorta, inferior vena cava, and retroperitoneal lymph nodes are unremarkable. Multiple cysts in the upper pole left kidney. No hydronephrosis in either kidney. The stomach is decompressed. Dilated mostly fluid-filled small bowel with decompression of the terminal ileum. A  transition zone appears to be in the right mid abdomen anteriorly of, or there is a tight kink. No mass is identified. Appearance likely due to adhesion. Small amount of free fluid in the mesenteries probably reactive. No bowel wall thickening or pneumatosis. No free air. Pelvis: The appendix is normal. Prostate gland is mildly prominent at 3.8 cm diameter. Bladder wall is not thickened. Scattered diverticula in the sigmoid colon without evidence of diverticulitis. Colon is mostly decompressed with scattered stool present. No free or loculated pelvic fluid collections. No pelvic mass or lymphadenopathy. Degenerative changes in the spine. No destructive bone lesions. IMPRESSION: Small bowel obstruction with transition zone in the right mid abdomen. No obstructing mass lesion identified. Changes likely due to adhesion. Mesenteric edema is likely reactive. Diffuse fatty infiltration of the liver. Electronically Signed   By: Burman Nieves M.D.   On: 06/25/2015 04:05   Dg Abd 2 Views  06/27/2015  CLINICAL DATA:  Followup small bowel obstruction. EXAM: ABDOMEN - 2 VIEW COMPARISON:  06/26/2015, 06/25/2015. FINDINGS: No significant change in the markedly dilated loops of small bowel throughout the upper abdomen demonstrating air-fluid levels on the erect image. Gas and fluid within normal caliber distal small bowel. Expected stool burden throughout normal caliber colon. No free intraperitoneal air. Nasogastric tube tip projects over the body of the stomach. Phleboliths low in the right side of the pelvis. No visible opaque urinary tract calculi. IMPRESSION: No significant change in the high-grade partial small bowel obstruction since 2 days ago. No free intraperitoneal air. Electronically Signed   By: Hulan Saas M.D.   On: 06/27/2015 11:11   Dg Abd Acute W/chest  06/25/2015  CLINICAL DATA:  60 year old male with abdominal pain nausea and vomiting EXAM: DG ABDOMEN ACUTE W/ 1V CHEST COMPARISON:  None. FINDINGS:  The lungs are clear. Left lung base linear/platelike atelectasis/ scarring. No focal consolidation, pleural effusion, or pneumothorax. The cardiac silhouette is within normal limits. Multiple dilated loops of small bowel with air-fluid levels noted most compatible with small bowel obstruction. Small amount of air noted mixed with stool in proximal colon. There is no free air. No radiopaque calculi. There is degenerative changes of the spine. No acute fracture. IMPRESSION: Dilated loops of small bowel with air-fluid levels most compatible with small bowel obstruction. Follow-up recommended. Electronically Signed   By: Elgie Collard M.D.   On: 06/25/2015 02:22   Dg Abd Portable 1v-small Bowel Obstruction Protocol-initial, 8 Hr Delay  06/28/2015  CLINICAL DATA:  8 hours status post administration of oral contrast. Abdominal distention. Initial encounter. EXAM: PORTABLE ABDOMEN - 1 VIEW COMPARISON:  Abdominal radiograph performed earlier today at 7:09 a.m. FINDINGS: There is dilatation of small-bowel loops to 5.1 cm in maximal diameter, compatible with persistent small bowel obstruction. Administered oral contrast is not well characterized, likely due to dilution. No free intra-abdominal air is seen, though evaluation for free air is limited on a single supine view. No acute osseous abnormalities are identified. An enteric tube is noted ending overlying the body of the stomach. IMPRESSION: Dilatation of small-bowel loops to  5.1 cm in maximal diameter, compatible with persistent high-grade small bowel obstruction. Administered oral contrast is not well characterized, likely due to dilution. No free intra-abdominal air seen. Electronically Signed   By: Roanna RaiderJeffery  Chang M.D.   On: 06/28/2015 00:19   Dg Abd Portable 1v  06/26/2015  CLINICAL DATA:  60 year old male with a history of abdominal pain EXAM: PORTABLE ABDOMEN - 1 VIEW COMPARISON:  Plain film 06/25/2015, CT 06/25/2015 FINDINGS: Single plain film the abdomen  demonstrates persistent small bowel dilation with no progression of the gas pattern. Relative absence of colonic gas, with small rectal gas. Tip of gastric tube projects in the left upper quadrant. IMPRESSION: Evidence of persisting high-grade small bowel obstruction, with no progression of the gas pattern and a relative absence of colonic gas. Gastric tube projects in the left upper quadrant. Signed, Yvone NeuJaime S. Loreta AveWagner, DO [facility] Electronically Signed   By: Gilmer MorJaime  Wagner D.O.   On: 06/26/2015 09:21   Dg Abd Portable 1v-small Bowel Obstruction Protocol-initial, 8 Hr Delay  06/25/2015  CLINICAL DATA:  Small bowel obstruction 8 hour delay film EXAM: PORTABLE ABDOMEN - 1 VIEW COMPARISON:  06/25/2015 at 7: 47 FINDINGS: Persistent dilated small bowel loops consistent with small bowel obstruction. Paucity of bowel gas in distal colon. Residual contrast material within bladder. IMPRESSION: Persistent gaseous dilated small bowel loops consistent with small bowel obstruction. Electronically Signed   By: Natasha MeadLiviu  Pop M.D.   On: 06/25/2015 17:13   Dg Abd Portable 1v-small Bowel Protocol-position Verification  06/25/2015  CLINICAL DATA:  Nasogastric tube placement. EXAM: PORTABLE ABDOMEN - 1 VIEW COMPARISON:  CT 06/25/2015. FINDINGS: NG tube noted with tip below left hemidiaphragm. Persistent dilated loops of small bowel consistent small bowel obstruction noted. Paucity of gas noted within the colon. No free air. Contrast in the bladder from prior CT. IMPRESSION: 1. NG tube noted with tip in the stomach. 2. Persistent prominently dilated small bowel loops consistent small bowel obstruction. Electronically Signed   By: Maisie Fushomas  Register   On: 06/25/2015 08:10    Microbiology: Recent Results (from the past 240 hour(s))  Surgical pcr screen     Status: Abnormal   Collection Time: 06/28/15 10:49 AM  Result Value Ref Range Status   MRSA, PCR NEGATIVE NEGATIVE  Final   Staphylococcus aureus POSITIVE (A) NEGATIVE Final    Comment:        The Xpert SA Assay (FDA approved for NASAL specimens in patients over 61 years of age), is one component of a comprehensive surveillance program.  Test performance has been validated by [facility] for patients greater than or equal to 61 year old. It is not intended to diagnose infection nor to guide or monitor treatment.      Labs: Basic Metabolic Panel:  Recent Labs Lab 06/30/15 0450 07/01/15 0741 07/02/15 0520 07/02/15 2000 07/03/15 0830 07/04/15 0636 07/05/15 0559 07/06/15 0458  NA 134* 135 137  --  138 139 136 135  K 3.5 3.2* 3.0* 4.0 3.5 3.4* 3.7 3.7  CL 102 101 102  --  105 105 103 101  CO2 25 26 28   --  27 26 26 25   GLUCOSE 154* 200* 153*  --  124* 120* 180* 154*  BUN 7 8 8   --  13 8 8 11   CREATININE 0.89 1.00 0.89  --  1.03 0.87 1.04 0.90  CALCIUM 8.3* 8.5* 8.5*  --  8.2* 8.4* 8.6* 8.5*  MG 2.0 1.9 2.0  --  2.2 1.9 2.0 1.8  PHOS 2.5 2.6 3.1  --   --  2.8 4.7*  --    Liver Function Tests:  Recent Labs Lab 07/01/15 0741 07/05/15 0559  AST 14* 15  ALT 9* 12*  ALKPHOS 59 66  BILITOT 0.5 0.7  PROT 6.0* 5.6*  ALBUMIN 2.6* 2.5*   No results for input(s): LIPASE, AMYLASE in the last 168 hours. No results for input(s): AMMONIA in the last 168 hours. CBC:  Recent Labs Lab 07/03/15 0830 07/05/15 0559  WBC 5.0 6.8  NEUTROABS  --  3.8  HGB 11.3* 12.0*  HCT 33.6* 35.9*  MCV 93.6 93.5  PLT 197 217   Cardiac Enzymes: No results for input(s): CKTOTAL, CKMB, CKMBINDEX, TROPONINI in the last 168 hours. BNP: BNP (last 3 results) No results for input(s): BNP in the last 8760 hours.  ProBNP (last 3 results) No results for input(s): PROBNP in the last 8760 hours.  CBG:  Recent Labs Lab 07/05/15 1810 07/05/15 2017 07/06/15 0009 07/06/15 0423 07/06/15 0806  GLUCAP 210* 188* 198* 161* 188*    Active Problems:   Small bowel obstruction (HCC)   Time coordinating  discharge: <30 mins   Signed:  Tanvir Hipple, ANP-BC

## 2015-07-06 NOTE — Progress Notes (Signed)
Pt discharged home with his friend in stable condition. Discharge education provided pre discharge with no concerns voiced. Pt walked off the unit having declined to be wheeled downstairs

## 2015-07-06 NOTE — Progress Notes (Signed)
PARENTERAL NUTRITION CONSULT NOTE - FOLLOW UP  Pharmacy Consult for TPN Indication: Prolonged ileus  No Known Allergies  Patient Measurements: Height: 5\' 11"  (180.3 cm) Weight: 188 lb (85.276 kg) IBW/kg (Calculated) : 75.3   Vital Signs: Temp: 99.3 F (37.4 C) (05/30 0427) Temp Source: Oral (05/30 0427) BP: 102/65 mmHg (05/30 0427) Pulse Rate: 89 (05/30 0427)   Insulin Requirements in the past 24 hours:  19 units mod SSI 20 units in TPN  Admit: [age] YOM presented with a four-day history of progressive abdominal pain/distention, nausea and vomiting. Found to have SBO and underwent ex-lap with LOA on 06/28/15 given no improvement with conservative management. Pharmacy consulted to initiate TPN for expected prolonged ileus.  Surgeries/Procedures:  5/22: ex lap with LOA  GI: PPI IV. Albumin is low at 2.5, prealbumin at 8.4. Abd soft & non-distended.NGT clamped 5/28 afternoon, last BM was today. Seems to be tolerating diet pretty well (not charted), advanced to soft diet on 5/29. Reglan, PPI. Endo: DM with A1c 10%. CBGs uncontrolled 140-210s. Not on any medications PTA.  Lytes: wnl exc K remains at 3.7 after replacement yesterday (goal >4) Mg down to 1.8 (goal > 2) CoCa 9.5. NS + 40K at 2510ml/hr Renal: SCr wnl - NS w/ 40k @ 10 ml/hr. UOP up to 0.755ml/kg/hr Pulm: on RA  Cards: no hx. VSS Hepatobil:  LFTs WNL, tbili down to wnl, lipase ok. Baseline trigs 77, now up to 181.   Neuro: Dilaudid PCA- 3.3mg /24hr, pain score 4-5.  Decreased PCA usage ID: AFeb, WBC WNL - not on abx  Best Practices: Lovenox; patient is not on medications PTA  TPN Access: PICC placed 5/23 TPN start date: 06/29/15  Current Nutrition:  Soft diet Clinimix E 5/15 at 1880ml/hr IV lipid emulsions at 4410ml/hr  Nutritional Goals: per RD 5/24 KCal: 2100-2300 Protein: 110-120 grams  Plan:  Wean off TPN today per surgery Discussed with RN, will cut rate in half to 1540ml/hr and then stop TPN and lipids after  2 hrs  Change moderate SSI to TID w/ meals and QHS Continue NS with KCl 40mEq at 2710ml/hr per MD Discontinue TPN orders and labs  Repeat KCl 10mEq x 3 runs Give Mg 2g IV x 1  Enzo BiNathan Inell Mimbs, PharmD, [facility] Clinical Pharmacist Pager [phone] 07/06/2015 9:09 AM

## 2015-07-06 NOTE — Discharge Instructions (Signed)

## 2017-04-20 IMAGING — DX DG ABDOMEN ACUTE W/ 1V CHEST
3 series · 3 of 3 positions shown · non-contrast
Comparison: None.

CLINICAL DATA: 60-year-old male with abdominal pain nausea and
vomiting

EXAM:
DG ABDOMEN ACUTE W/ 1V CHEST

[chest pa]
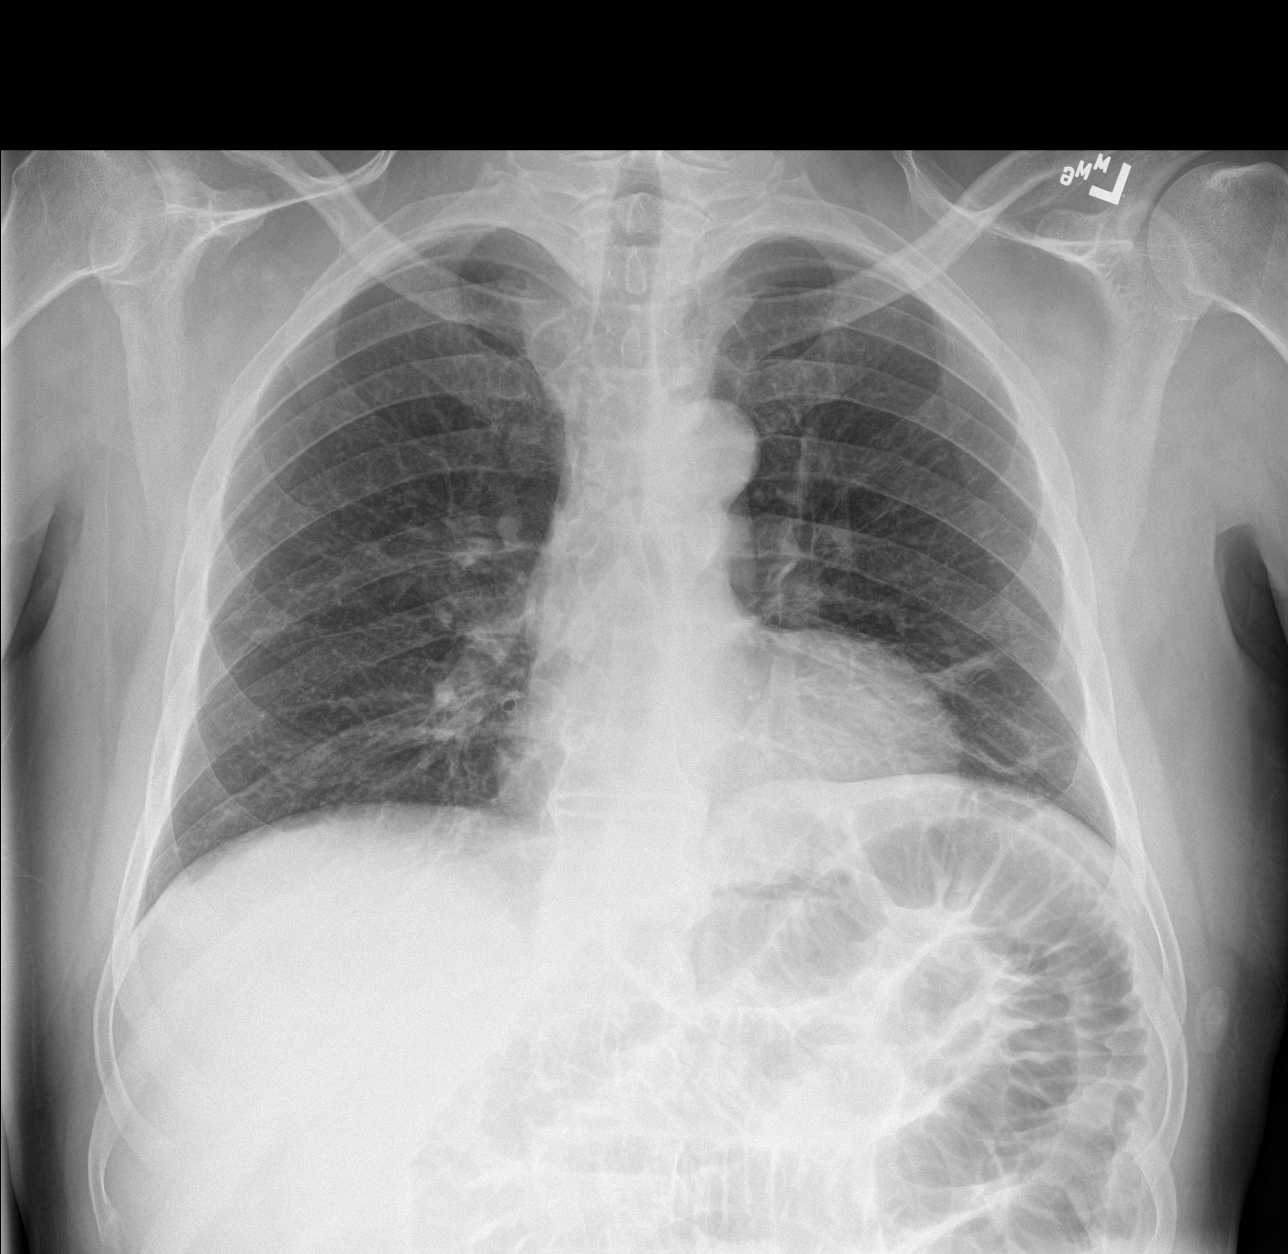

[abdomen erect]
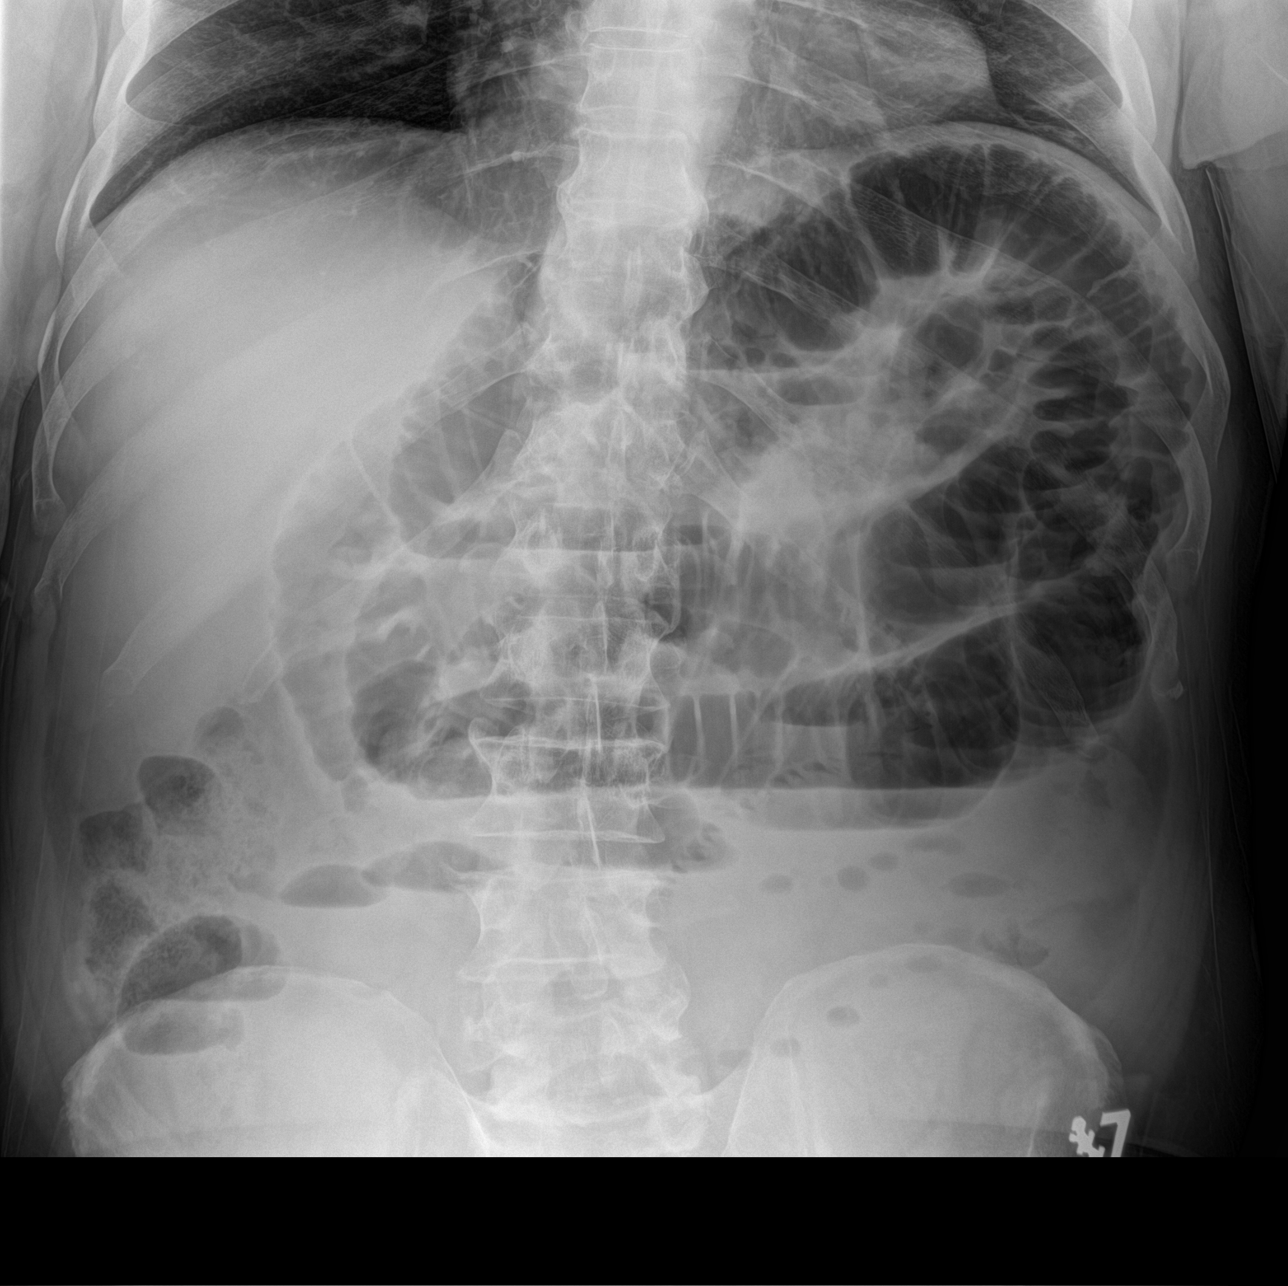

[abdomen supine]
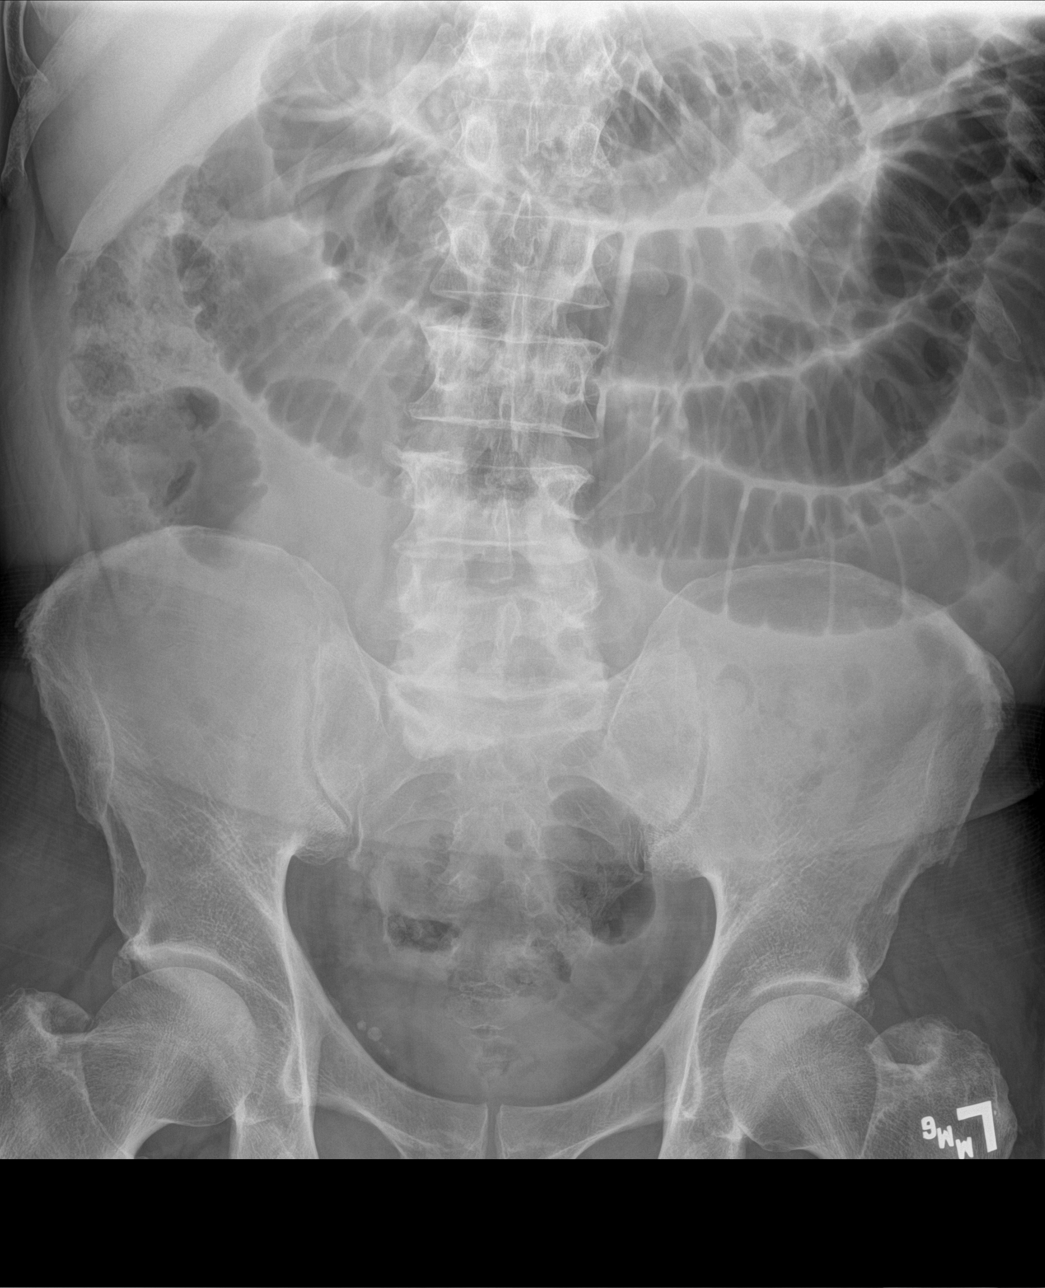

[3 of 3 positions shown; findings below may reference images not displayed]

FINDINGS: The lungs are clear. Left lung base linear/platelike atelectasis/
scarring. No focal consolidation, pleural effusion, or pneumothorax.
The cardiac silhouette is within normal limits.

Multiple dilated loops of small bowel with air-fluid levels noted
most compatible with small bowel obstruction. Small amount of air
noted mixed with stool in proximal colon. There is no free air. No
radiopaque calculi. There is degenerative changes of the spine. No
acute fracture.
IMPRESSION: Dilated loops of small bowel with air-fluid levels most compatible
with small bowel obstruction. Follow-up recommended.

## 2017-04-20 IMAGING — CR DG ABD PORTABLE 1V
1 series · 1 of 1 positions shown · non-contrast
Comparison: 06/25/2015 at 7: 47

CLINICAL DATA: Small bowel obstruction 8 hour delay film

EXAM:
PORTABLE ABDOMEN - 1 VIEW

[AP]
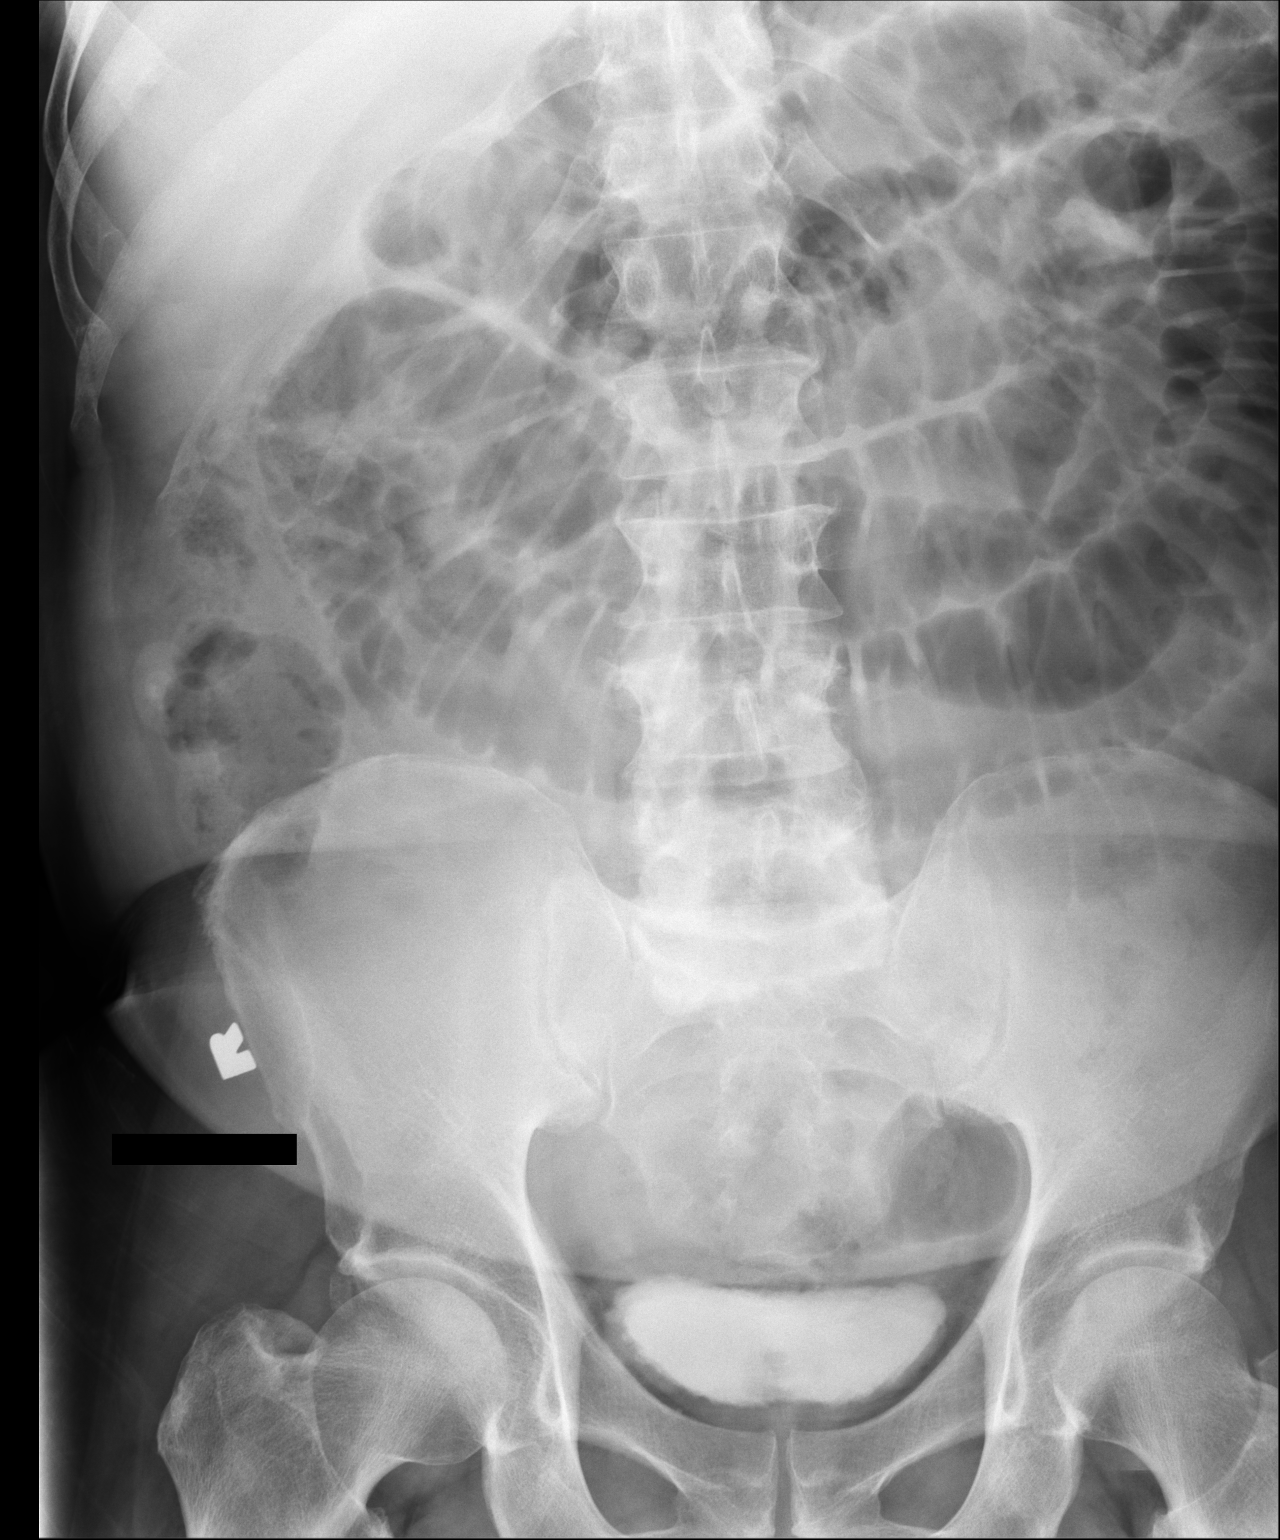

[1 of 1 positions shown; findings below may reference images not displayed]

FINDINGS: Persistent dilated small bowel loops consistent with small bowel
obstruction. Paucity of bowel gas in distal colon. Residual contrast
material within bladder.
IMPRESSION: Persistent gaseous dilated small bowel loops consistent with small
bowel obstruction.

## 2017-04-22 IMAGING — DX DG ABDOMEN 2V
2 series · 2 of 2 positions shown · non-contrast
Comparison: 06/26/2015, 06/25/2015.

CLINICAL DATA: Followup small bowel obstruction.

EXAM:
ABDOMEN - 2 VIEW

[abdomen erect]
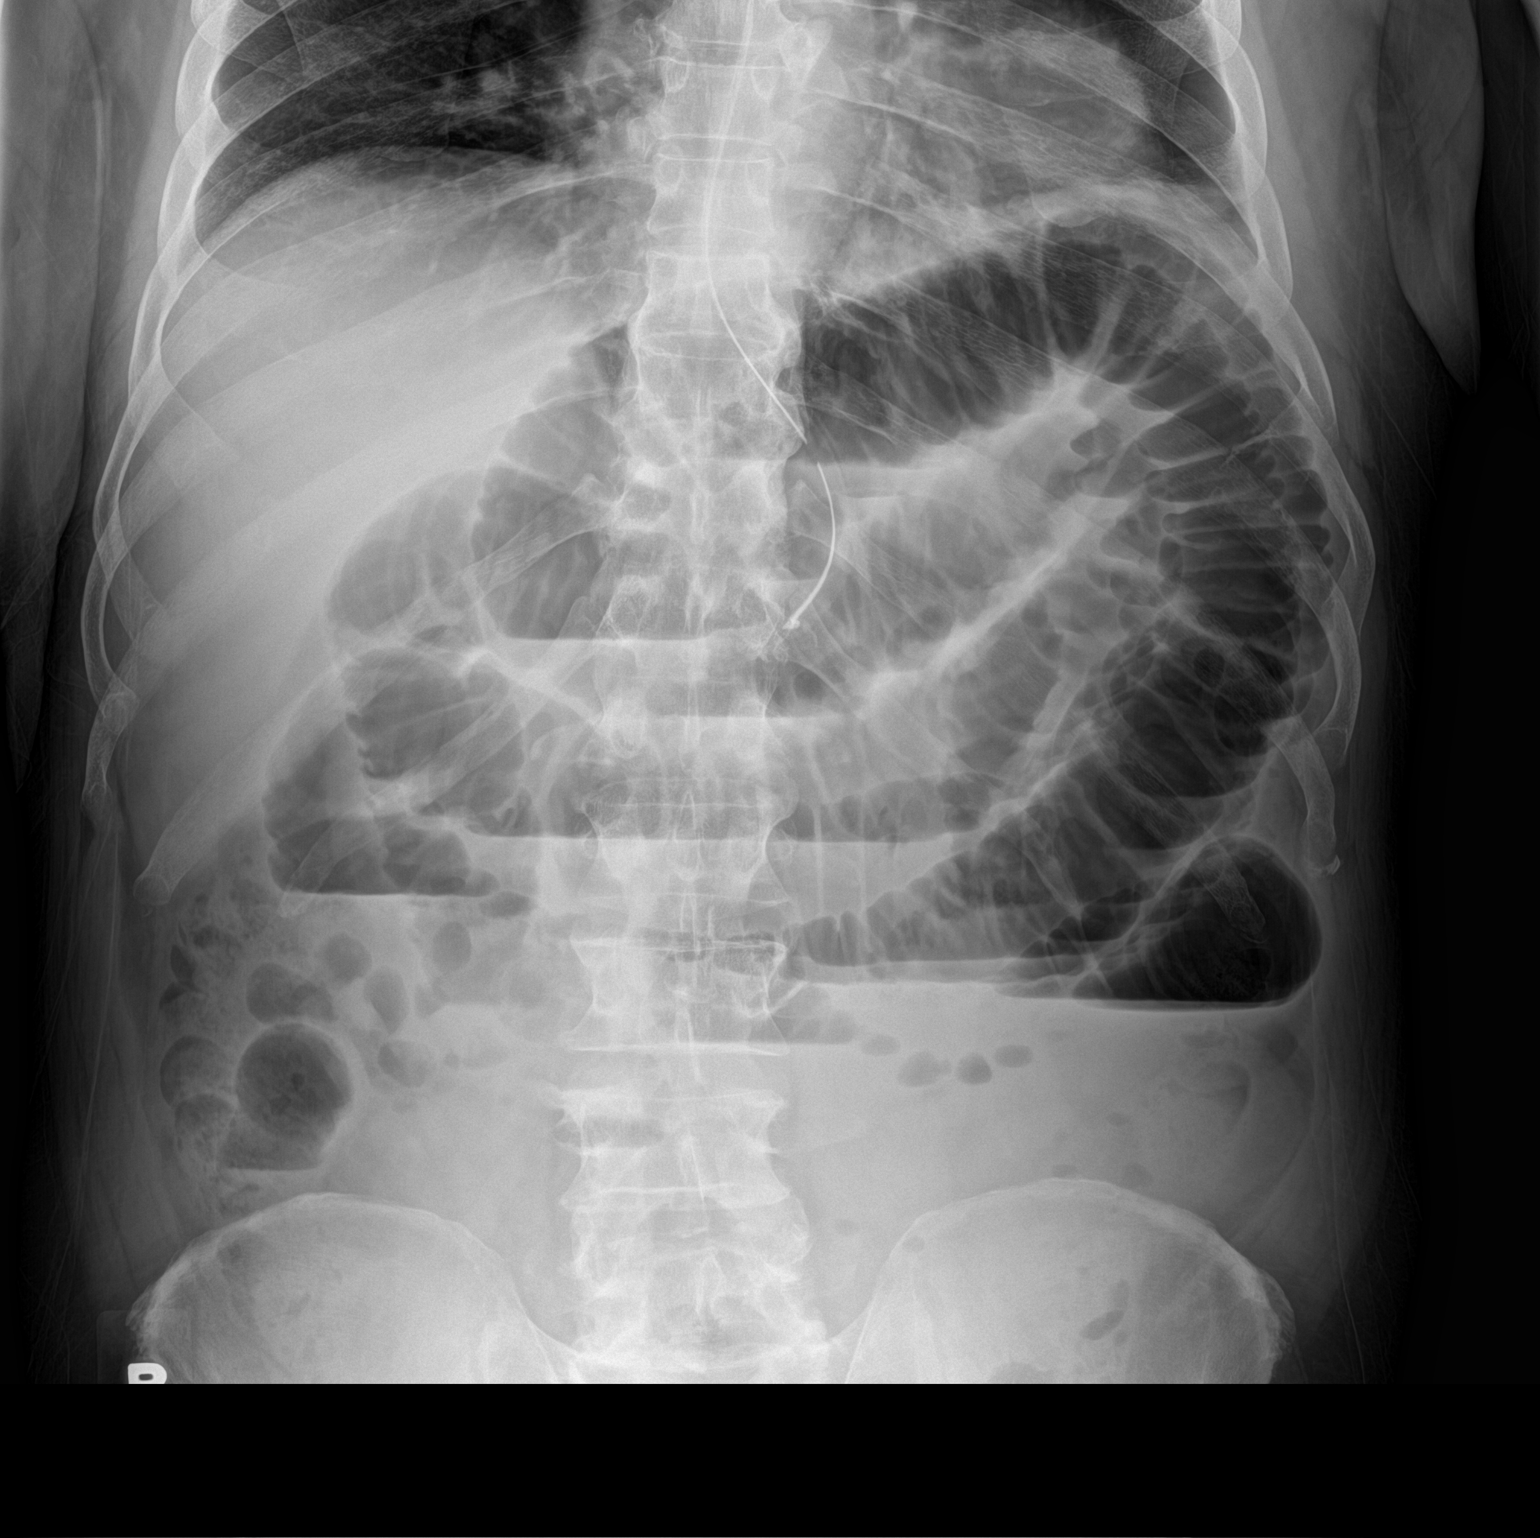

[abdomen supine]
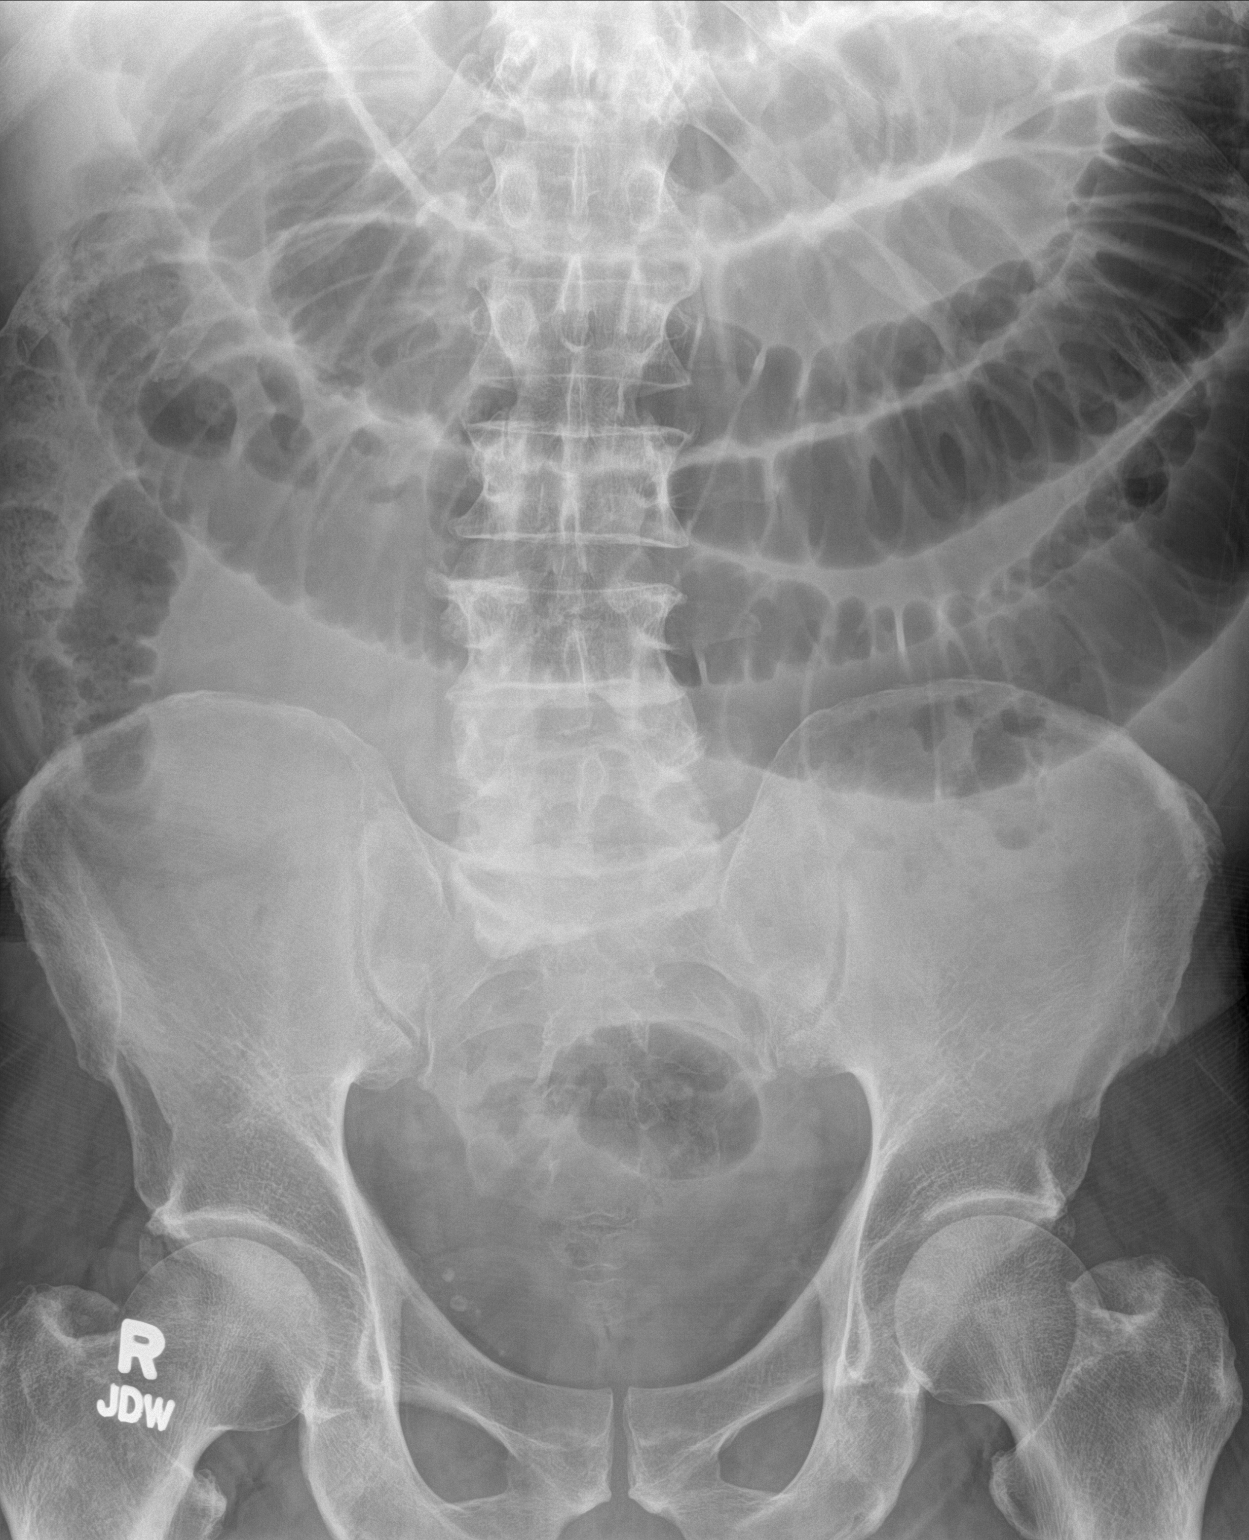

[2 of 2 positions shown; findings below may reference images not displayed]

FINDINGS: No significant change in the markedly dilated loops of small bowel
throughout the upper abdomen demonstrating air-fluid levels on the
erect image. Gas and fluid within normal caliber distal small bowel.
Expected stool burden throughout normal caliber colon. No free
intraperitoneal air. Nasogastric tube tip projects over the body of
the stomach. Phleboliths low in the right side of the pelvis. No
visible opaque urinary tract calculi.
IMPRESSION: No significant change in the high-grade partial small bowel
obstruction since 2 days ago. No free intraperitoneal air.

## 2018-01-22 ENCOUNTER — Other Ambulatory Visit: Payer: Self-pay

## 2018-01-22 ENCOUNTER — Encounter (HOSPITAL_COMMUNITY): Payer: Self-pay

## 2018-01-22 ENCOUNTER — Emergency Department (HOSPITAL_COMMUNITY): Payer: Non-veteran care

## 2018-01-22 ENCOUNTER — Emergency Department (HOSPITAL_COMMUNITY)
Admission: EM | Admit: 2018-01-22 | Discharge: 2018-01-22 | Disposition: A | Discharge status: Home / Self Care | Payer: Non-veteran care | Attending: Emergency Medicine | Admitting: Emergency Medicine

## 2018-01-22 DIAGNOSIS — R1084 Generalized abdominal pain: Secondary | ICD-10-CM

## 2018-01-22 DIAGNOSIS — K5900 Constipation, unspecified: Secondary | ICD-10-CM | POA: Insufficient documentation

## 2018-01-22 DIAGNOSIS — E119 Type 2 diabetes mellitus without complications: Secondary | ICD-10-CM | POA: Insufficient documentation

## 2018-01-22 DIAGNOSIS — R109 Unspecified abdominal pain: Secondary | ICD-10-CM | POA: Diagnosis present

## 2018-01-22 LAB — CBC WITH DIFFERENTIAL/PLATELET
Abs Immature Granulocytes: 0.04 10*3/uL (ref 0.00–0.07)
BASOS PCT: 0 %
Basophils Absolute: 0 10*3/uL (ref 0.0–0.1)
EOS ABS: 0 10*3/uL (ref 0.0–0.5)
Eosinophils Relative: 0 %
HCT: 28.9 % — ABNORMAL LOW (ref 39.0–52.0)
Hemoglobin: 8.8 g/dL — ABNORMAL LOW (ref 13.0–17.0)
IMMATURE GRANULOCYTES: 0 %
Lymphocytes Relative: 10 %
Lymphs Abs: 1.2 10*3/uL (ref 0.7–4.0)
MCH: 25.9 pg — ABNORMAL LOW (ref 26.0–34.0)
MCHC: 30.4 g/dL (ref 30.0–36.0)
MCV: 85 fL (ref 80.0–100.0)
Monocytes Absolute: 0.9 10*3/uL (ref 0.1–1.0)
Monocytes Relative: 7 %
NEUTROS PCT: 83 %
NRBC: 0 % (ref 0.0–0.2)
Neutro Abs: 9.8 10*3/uL — ABNORMAL HIGH (ref 1.7–7.7)
PLATELETS: 202 10*3/uL (ref 150–400)
RBC: 3.4 MIL/uL — ABNORMAL LOW (ref 4.22–5.81)
RDW: 14.6 % (ref 11.5–15.5)
WBC: 11.9 10*3/uL — ABNORMAL HIGH (ref 4.0–10.5)

## 2018-01-22 LAB — HEPATIC FUNCTION PANEL
ALT: 11 U/L (ref 0–44)
AST: 15 U/L (ref 15–41)
Albumin: 3.9 g/dL (ref 3.5–5.0)
Alkaline Phosphatase: 92 U/L (ref 38–126)
Total Bilirubin: 0.6 mg/dL (ref 0.3–1.2)
Total Protein: 7.6 g/dL (ref 6.5–8.1)

## 2018-01-22 LAB — I-STAT CHEM 8, ED
BUN: 12 mg/dL (ref 8–23)
Calcium, Ion: 1.1 mmol/L — ABNORMAL LOW (ref 1.15–1.40)
Chloride: 102 mmol/L (ref 98–111)
Creatinine, Ser: 1.1 mg/dL (ref 0.61–1.24)
Glucose, Bld: 202 mg/dL — ABNORMAL HIGH (ref 70–99)
HEMATOCRIT: 28 % — AB (ref 39.0–52.0)
HEMOGLOBIN: 9.5 g/dL — AB (ref 13.0–17.0)
POTASSIUM: 4.2 mmol/L (ref 3.5–5.1)
SODIUM: 137 mmol/L (ref 135–145)
TCO2: 26 mmol/L (ref 22–32)

## 2018-01-22 MED ORDER — MORPHINE SULFATE (PF) 4 MG/ML IV SOLN
4.0000 mg | Freq: Once | INTRAVENOUS | Status: AC
  Administered 2018-01-22: 4 mg via INTRAVENOUS
  Filled 2018-01-22: qty 1

## 2018-01-22 MED ORDER — MAGNESIUM CITRATE PO SOLN: 1.0000 | Freq: Every day | ORAL | 0 refills | Status: AC | PRN

## 2018-01-22 MED ORDER — FLEET ENEMA 7-19 GM/118ML RE ENEM: 1.0000 | ENEMA | Freq: Every day | RECTAL | 0 refills | Status: AC | PRN

## 2018-01-22 MED ORDER — IOHEXOL 300 MG/ML  SOLN
100.0000 mL | Freq: Once | INTRAMUSCULAR | Status: AC | PRN
  Administered 2018-01-22: 100 mL via INTRAVENOUS

## 2018-01-22 MED ORDER — SENNOSIDES-DOCUSATE SODIUM 8.6-50 MG PO TABS: 2.0000 | ORAL_TABLET | Freq: Every day | ORAL | 0 refills | Status: AC

## 2018-01-22 NOTE — ED Triage Notes (Signed)
Pt here today for constipation. States he hasnt had a BM in 3-4 days and took a laxative yesterday (mag citrate) without any result, though he says now he has frequent small liquid stool around 20 times since then. C/o cramping.  Does report passing gas. Small bowel obstruction 1.5 years ago, similar s/s to that occurrence per pt report.

## 2018-01-22 NOTE — Discharge Instructions (Signed)
As discussed, your evaluation today has been largely reassuring.  But, it is important that you monitor your condition carefully, and do not hesitate to return to the ED if you develop new, or concerning changes in your condition. ? ?Otherwise, please follow-up with your physician for appropriate ongoing care. ? ?

## 2018-01-22 NOTE — ED Provider Notes (Signed)
[facility] EMERGENCY DEPARTMENT Provider Note   CSN: [id number] Arrival date & time: 01/22/18  1421     History   Chief Complaint Chief Complaint  Patient presents with  . Constipation    HPI Justin Yang is a 62 y.o. male.  HPI Patient with history of prior bowel obstruction presents with concern for nausea, anorexia, abdominal pain, and diminished bowel movements. Patient had a prior bowel obstruction, requiring surgery about 1 year ago, without surgery before that episode. This illness has been happening for about 3 days, with persistent pain, though inconsistent in its location, described as sharp, severe. No relief with anything. He specifically notes that he did tried manual disimpaction himself was unable to appreciate stool in his rectal vault. No fever, no chills Patient is here with male companion who assists with the HPI. Past Medical History:  Diagnosis Date  . Diabetes mellitus without complication [facility])     Patient Active Problem List   Diagnosis Date Noted  . Small bowel obstruction (HCC) 06/25/2015    Past Surgical History:  Procedure Laterality Date  . LAPAROSCOPY N/A 06/28/2015   Procedure: LAPAROSCOPY DIAGNOSTIC;  Surgeon: Abigail Miyamoto, MD;  Location: [facility] OR;  Service: General;  Laterality: N/A;  . LAPAROTOMY N/A 06/28/2015   Procedure: LYSIS OF ADHESIONS;  Surgeon: Abigail Miyamoto, MD;  Location: [facility] OR;  Service: General;  Laterality: N/A;  . SHOULDER SURGERY          Home Medications    Prior to Admission medications   Medication Sig Start Date End Date Taking? Authorizing Provider  HYDROcodone-acetaminophen (NORCO/VICODIN) 5-325 MG tablet Take 1-2 tablets by mouth every 6 (six) hours as needed for moderate pain. 07/06/15   Ashok Norris, NP    Family History History reviewed. No pertinent family history.  Social History Social History   Tobacco Use  . Smoking status: Never Smoker  . Smokeless tobacco:  Never Used  Substance Use Topics  . Alcohol use: Yes  . Drug use: No     Allergies   Patient has no known allergies.   Review of Systems Review of Systems  Constitutional:       Per HPI, otherwise negative  HENT:       Per HPI, otherwise negative  Respiratory:       Per HPI, otherwise negative  Cardiovascular:       Per HPI, otherwise negative  Gastrointestinal: Positive for abdominal pain and nausea. Negative for vomiting.  Endocrine:       Negative aside from HPI  Genitourinary:       Neg aside from HPI   Musculoskeletal:       Per HPI, otherwise negative  Skin: Negative.   Neurological: Negative for syncope.     Physical Exam Updated Vital Signs BP 107/78 (BP Location: Right Arm)   Pulse 84   Temp 98.1 F (36.7 C) (Oral)   Resp 19   Ht 5\' 11"  (1.803 m)   Wt 93 kg   SpO2 100%   BMI 28.59 kg/m   Physical Exam Vitals signs and nursing note reviewed.  Constitutional:      General: He is not in acute distress.    Appearance: He is well-developed.  HENT:     Head: Normocephalic and atraumatic.  Eyes:     Conjunctiva/sclera: Conjunctivae normal.  Cardiovascular:     Rate and Rhythm: Normal rate and regular rhythm.  Pulmonary:     Effort: Pulmonary effort is normal. No respiratory  distress.     Breath sounds: No stridor.  Abdominal:     General: There is no distension.     Tenderness: There is abdominal tenderness. There is guarding.  Skin:    General: Skin is warm and dry.  Neurological:     Mental Status: He is alert and oriented to person, place, and time.      ED Treatments / Results  Labs (all labs ordered are listed, but only abnormal results are displayed) Labs Reviewed  CBC WITH DIFFERENTIAL/PLATELET - Abnormal; Notable for the following components:      Result Value   WBC 11.9 (*)    RBC 3.40 (*)    Hemoglobin 8.8 (*)    HCT 28.9 (*)    MCH 25.9 (*)    Neutro Abs 9.8 (*)    All other components within normal limits  I-STAT CHEM  8, ED - Abnormal; Notable for the following components:   Glucose, Bld 202 (*)    Calcium, Ion 1.10 (*)    Hemoglobin 9.5 (*)    HCT 28.0 (*)    All other components within normal limits  HEPATIC FUNCTION PANEL    EKG None  Radiology Ct Abdomen Pelvis W Contrast  Result Date: 01/22/2018 CLINICAL DATA:  Evaluate for bowel obstruction. Constipation. History of small-bowel obstruction 1 and half years ago EXAM: CT ABDOMEN AND PELVIS WITH CONTRAST TECHNIQUE: Multidetector CT imaging of the abdomen and pelvis was performed using the standard protocol following bolus administration of intravenous contrast. CONTRAST:  OMNIPAQUE IOHEXOL 300 MG/ML  SOLN COMPARISON:  KUB 06/27/2015 and CT abdomen pelvis of 06/25/2015 FINDINGS: Lower chest: The lung bases are clear other than mild dependent linear atelectasis. Heart is within normal limits in size. No pericardial effusion is seen. Hepatobiliary: The liver enhances with no focal abnormality and no ductal dilatation is seen. No calcified gallstones are noted. Pancreas: The pancreas is normal in size and the pancreatic duct is not dilated. Spleen: The spleen is unremarkable. Adrenals/Urinary Tract: The adrenal glands appear normal. The kidneys enhance and multiple renal cysts are present particularly within the upper pole of the left kidney. The cysts appear relatively stable compared to the CT of 2017. No renal calculi are noted. On delayed images, the pelvocaliceal systems are unremarkable other than some splaying within the left upper pole as result of the adjacent cysts. The ureters appear normal in caliber. The urinary bladder is somewhat decompressed, but no obvious lesion is evident. Stomach/Bowel: The stomach is not well distended. No small bowel distention is seen. However, there is a large amount of feces throughout the colon with probable fecal impaction of the rectum. No definite rectal mucosal edema is evident currently. There is also some fluid  scattered throughout the colon. Terminal ileum and the appendix are unremarkable. Vascular/Lymphatic: There is moderate abdominal aortic atherosclerosis present. No adenopathy is seen. Reproductive: The prostate is normal in size for age. Other: No abdominal wall hernia is seen. Musculoskeletal: The lumbar vertebrae are in normal alignment. There is degenerative disc disease at L5-S1 with considerable loss of joint space and spurring. No compression deformity is seen. The SI joints appear well corticated. IMPRESSION: 1. Large amount of feces throughout the colon with apparent fecal impaction of the rectum. No definite rectal mucosal edema is seen currently. This finding may explain the patient's symptoms. There is no evidence of bowel obstruction. 2. The appendix and terminal ileum are unremarkable. 3. Moderate abdominal aortic atherosclerosis. Electronically Signed   By: Renae Fickle  Gery PrayBarry M.D.   On: 01/22/2018 17:19    Procedures Procedures (including critical care time)  Medications Ordered in ED Medications  morphine 4 MG/ML injection 4 mg (4 mg Intravenous Given 01/22/18 1551)  iohexol (OMNIPAQUE) 300 MG/ML solution 100 mL (100 mLs Intravenous Contrast Given 01/22/18 1659)     Initial Impression / Assessment and Plan / ED Course  I have reviewed the triage vital signs and the nursing notes.  Pertinent labs & imaging results that were available during my care of the patient were reviewed by me and considered in my medical decision making (see chart for details).    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 Patient had partial bowel resection.   6:10 PM Patient awake, alert, in no distress. Have discussed findings with him and his male companion. Amenable to initiation of more aggressive bowel movement regimen at home, for concern of constipation. With no CT evidence for obstruction, labs that are  slightly off but generally reassuring, mild hemoglobin anemia noted, patient is appropriate for discharge with close outpatient follow-up.   Final Clinical Impressions(s) / ED Diagnoses  Abdominal pain, generalized   Gerhard MunchLockwood, Yuta Cipollone, MD 01/22/18 1811

## 2020-02-27 ENCOUNTER — Ambulatory Visit (HOSPITAL_COMMUNITY)
Admission: EM | Admit: 2020-02-27 | Discharge: 2020-02-27 | Disposition: A | Discharge status: Home / Self Care | Payer: Medicare Other | Attending: Emergency Medicine | Admitting: Emergency Medicine

## 2020-02-27 ENCOUNTER — Encounter (HOSPITAL_COMMUNITY): Payer: Self-pay

## 2020-02-27 ENCOUNTER — Other Ambulatory Visit: Payer: Self-pay

## 2020-02-27 DIAGNOSIS — J069 Acute upper respiratory infection, unspecified: Secondary | ICD-10-CM | POA: Diagnosis not present

## 2020-02-27 DIAGNOSIS — U071 COVID-19: Secondary | ICD-10-CM | POA: Insufficient documentation

## 2020-02-27 LAB — SARS CORONAVIRUS 2 (TAT 6-24 HRS): SARS Coronavirus 2: POSITIVE — AB

## 2020-02-27 NOTE — Discharge Instructions (Signed)
Self isolate until covid results are back.  We will notify you by phone if it is positive. Your negative results will be sent through your MyChart.    If it is positive you need to isolate from others for a total of 5 days. If no fever for 24 hours without medications, and symptoms improving you may end isolation on day 6, but wear a mask if around any others for an additional 5 days.   Push fluids to ensure adequate hydration and keep secretions thin.  Over the counter medications as needed for symptoms.  Tylenol and/or ibuprofen as needed for pain or fevers.   If symptoms worsen or do not improve in the next week to return to be seen or to follow up with your PCP.

## 2020-02-27 NOTE — ED Provider Notes (Signed)
[facility]    CSN: [id number] Arrival date & time: 02/27/20  1150      History   Chief Complaint Chief Complaint  Patient presents with  . Nausea  . Fever  . Cough    HPI Justin Yang is a 65 y.o. male.   Justin Yang presents with complaints of fevers, congestion with slight cough, body aches, mild nausea, which started two days ago. Mildly improved- no further nausea or fevers. Hasn't taken any medications for symptoms. He has had a few coworkers test positive for covid-19. Hasn't had it in the past but has been vaccinated for covid. Denies shortness of breath or chest pain .    ROS per HPI, negative if not otherwise mentioned.      Past Medical History:  Diagnosis Date  . Diabetes mellitus without complication [facility])     Patient Active Problem List   Diagnosis Date Noted  . Small bowel obstruction (HCC) 06/25/2015    Past Surgical History:  Procedure Laterality Date  . LAPAROSCOPY N/A 06/28/2015   Procedure: LAPAROSCOPY DIAGNOSTIC;  Surgeon: Abigail Miyamoto, MD;  Location: [facility] OR;  Service: General;  Laterality: N/A;  . LAPAROTOMY N/A 06/28/2015   Procedure: LYSIS OF ADHESIONS;  Surgeon: Abigail Miyamoto, MD;  Location: [facility] OR;  Service: General;  Laterality: N/A;  . SHOULDER SURGERY         Home Medications    Prior to Admission medications   Medication Sig Start Date End Date Taking? Authorizing Provider  gabapentin (NEURONTIN) 800 MG tablet Take 800 mg by mouth 3 (three) times daily.   Yes [provider]  HYDROcodone-acetaminophen (NORCO/VICODIN) 5-325 MG tablet Take 1-2 tablets by mouth every 6 (six) hours as needed for moderate pain. 07/06/15   Ashok Norris, NP    Family History History reviewed. No pertinent family history.  Social History Social History   Tobacco Use  . Smoking status: Never Smoker  . Smokeless tobacco: Never Used  Substance Use Topics  . Alcohol use: Yes  . Drug use: No     Allergies    Patient has no known allergies.   Review of Systems Review of Systems   Physical Exam Triage Vital Signs ED Triage Vitals [02/27/20 1352]  Enc Vitals Group     BP (!) 164/71     Pulse Rate 67     Resp 19     Temp 98.5 F (36.9 C)     Temp src      SpO2 99 %     Weight      Height      Head Circumference      Peak Flow      Pain Score 4     Pain Loc      Pain Edu?      Excl. in GC?    No data found.  Updated Vital Signs BP (!) 164/71   Pulse 67   Temp 98.5 F (36.9 C)   Resp 19   SpO2 99%   Visual Acuity Right Eye Distance:   Left Eye Distance:   Bilateral Distance:    Right Eye Near:   Left Eye Near:    Bilateral Near:     Physical Exam Constitutional:      Appearance: He is well-developed.  Cardiovascular:     Rate and Rhythm: Normal rate.  Pulmonary:     Effort: Pulmonary effort is normal.  Skin:    General: Skin is warm and dry.  Neurological:     Mental Status: He is alert and oriented to person, place, and time.      UC Treatments / Results  Labs (all labs ordered are listed, but only abnormal results are displayed) Labs Reviewed  SARS CORONAVIRUS 2 (TAT 6-24 HRS)    EKG   Radiology No results found.  Procedures Procedures (including critical care time)  Medications Ordered in UC Medications - No data to display  Initial Impression / Assessment and Plan / UC Course  I have reviewed the triage vital signs and the nursing notes.  Pertinent labs & imaging results that were available during my care of the patient were reviewed by me and considered in my medical decision making (see chart for details).     Non toxic. Benign physical exam. History and physical consistent with viral illness.  covid exposures at work.  Covid testing pending and isolation instructions provided.  Supportive cares recommended. Return precautions provided. Patient verbalized understanding and agreeable to plan.   Final Clinical Impressions(s) / UC  Diagnoses   Final diagnoses:  Upper respiratory tract infection, unspecified type     Discharge Instructions     Self isolate until covid results are back.  We will notify you by phone if it is positive. Your negative results will be sent through your MyChart.    If it is positive you need to isolate from others for a total of 5 days. If no fever for 24 hours without medications, and symptoms improving you may end isolation on day 6, but wear a mask if around any others for an additional 5 days.   Push fluids to ensure adequate hydration and keep secretions thin.  Over the counter medications as needed for symptoms.  Tylenol and/or ibuprofen as needed for pain or fevers.   If symptoms worsen or do not improve in the next week to return to be seen or to follow up with your PCP.     ED Prescriptions    None     PDMP not reviewed this encounter.   Georgetta Haber, NP 02/27/20 1437

## 2020-02-27 NOTE — ED Triage Notes (Signed)
Pt in with c/o nausea, productive cough, congestion and subjective fever that has been going on for 2 days now. Also c/o body aches  Pt has not had medication for sxs

## 2020-02-29 ENCOUNTER — Encounter: Payer: Self-pay | Admitting: Unknown Physician Specialty

## 2020-02-29 ENCOUNTER — Other Ambulatory Visit: Payer: Self-pay | Admitting: Unknown Physician Specialty

## 2020-02-29 ENCOUNTER — Telehealth: Payer: Self-pay | Admitting: Unknown Physician Specialty

## 2020-02-29 DIAGNOSIS — U071 COVID-19: Secondary | ICD-10-CM

## 2020-02-29 NOTE — Telephone Encounter (Signed)
I connected by phone with Justin Yang on 02/29/2020 at 11:19 AM to discuss the potential use of a new treatment for mild to moderate COVID-19 viral infection in non-hospitalized patients.  This patient is a 65 y.o. male that meets the FDA criteria for Emergency Use Authorization of COVID monoclonal antibody sotrovimab.  Has a (+) direct SARS-CoV-2 viral test result  Has mild or moderate COVID-19   Is NOT hospitalized due to COVID-19  Is within 10 days of symptom onset  Has at least one of the high risk factor(s) for progression to severe COVID-19 and/or hospitalization as defined in EUA.  Specific high risk criteria : Older age (>/= 66 yo)   I have spoken and communicated the following to the patient or parent/caregiver regarding COVID monoclonal antibody treatment:  1. FDA has authorized the emergency use for the treatment of mild to moderate COVID-19 in adults and pediatric patients with positive results of direct SARS-CoV-2 viral testing who are 68 years of age and older weighing at least 40 kg, and who are at high risk for progressing to severe COVID-19 and/or hospitalization.  2. The significant known and potential risks and benefits of COVID monoclonal antibody, and the extent to which such potential risks and benefits are unknown.  3. Information on available alternative treatments and the risks and benefits of those alternatives, including clinical trials.  4. Patients treated with COVID monoclonal antibody should continue to self-isolate and use infection control measures (e.g., wear mask, isolate, social distance, avoid sharing personal items, clean and disinfect "high touch" surfaces, and frequent handwashing) according to CDC guidelines.   5. The patient or parent/caregiver has the option to accept or refuse COVID monoclonal antibody treatment.  After reviewing this information with the patient, the patient has agreed to receive one of the available covid 19 monoclonal  antibodies and will be provided an appropriate fact sheet prior to infusion. Gabriel Cirri, NP 02/29/2020 11:19 AM  Sx onset 1/19

## 2020-03-01 ENCOUNTER — Ambulatory Visit (HOSPITAL_COMMUNITY)
Admission: RE | Admit: 2020-03-01 | Discharge: 2020-03-01 | Disposition: A | Discharge status: Home / Self Care | Payer: Medicare Other | Source: Ambulatory Visit | Attending: Pulmonary Disease | Admitting: Pulmonary Disease

## 2020-03-01 DIAGNOSIS — U071 COVID-19: Secondary | ICD-10-CM | POA: Insufficient documentation

## 2020-03-01 MED ORDER — METHYLPREDNISOLONE SODIUM SUCC 125 MG IJ SOLR: 125.0000 mg | Freq: Once | INTRAMUSCULAR | Status: DC | PRN

## 2020-03-01 MED ORDER — FAMOTIDINE IN NACL 20-0.9 MG/50ML-% IV SOLN: 20.0000 mg | Freq: Once | INTRAVENOUS | Status: DC | PRN

## 2020-03-01 MED ORDER — ALBUTEROL SULFATE HFA 108 (90 BASE) MCG/ACT IN AERS: 2.0000 | INHALATION_SPRAY | Freq: Once | RESPIRATORY_TRACT | Status: DC | PRN

## 2020-03-01 MED ORDER — SOTROVIMAB 500 MG/8ML IV SOLN
500.0000 mg | Freq: Once | INTRAVENOUS | Status: AC
  Administered 2020-03-01: 500 mg via INTRAVENOUS

## 2020-03-01 MED ORDER — EPINEPHRINE 0.3 MG/0.3ML IJ SOAJ: 0.3000 mg | Freq: Once | INTRAMUSCULAR | Status: DC | PRN

## 2020-03-01 MED ORDER — SODIUM CHLORIDE 0.9 % IV SOLN: INTRAVENOUS | Status: DC | PRN

## 2020-03-01 MED ORDER — DIPHENHYDRAMINE HCL 50 MG/ML IJ SOLN: 50.0000 mg | Freq: Once | INTRAMUSCULAR | Status: DC | PRN

## 2020-03-01 NOTE — Progress Notes (Signed)
Diagnosis: COVID-19  Physician: Dr. Patrick Wright  Procedure: Covid Infusion Clinic Med: Sotrovimab infusion - Provided patient with sotrovimab fact sheet for patients, parents, and caregivers prior to infusion.   Complications: No immediate complications noted  Discharge: Discharged home    

## 2020-03-01 NOTE — Progress Notes (Signed)
Patient reviewed Fact Sheet for Patients, Parents, and Caregivers for Emergency Use Authorization (EUA) of sotrovimab for the Treatment of Coronavirus. Patient also reviewed and is agreeable to the estimated cost of treatment. Patient is agreeable to proceed.    Adalyne Lovick R Coree Brame, RN  

## 2020-03-01 NOTE — Discharge Instructions (Signed)

## 2021-05-19 ENCOUNTER — Other Ambulatory Visit: Payer: Self-pay

## 2021-05-19 ENCOUNTER — Encounter (HOSPITAL_COMMUNITY): Payer: Self-pay | Admitting: *Deleted

## 2021-05-19 ENCOUNTER — Inpatient Hospital Stay (HOSPITAL_COMMUNITY)
Admission: EM | Admit: 2021-05-19 | Discharge: 2021-06-03 | DRG: 330 | Disposition: A | Discharge status: Home Health | Payer: No Typology Code available for payment source | Attending: Internal Medicine | Admitting: Internal Medicine

## 2021-05-19 DIAGNOSIS — E440 Moderate protein-calorie malnutrition: Secondary | ICD-10-CM | POA: Insufficient documentation

## 2021-05-19 DIAGNOSIS — E785 Hyperlipidemia, unspecified: Secondary | ICD-10-CM | POA: Diagnosis present

## 2021-05-19 DIAGNOSIS — D509 Iron deficiency anemia, unspecified: Secondary | ICD-10-CM | POA: Diagnosis present

## 2021-05-19 DIAGNOSIS — K5641 Fecal impaction: Secondary | ICD-10-CM | POA: Diagnosis present

## 2021-05-19 DIAGNOSIS — D62 Acute posthemorrhagic anemia: Secondary | ICD-10-CM | POA: Diagnosis not present

## 2021-05-19 DIAGNOSIS — K56609 Unspecified intestinal obstruction, unspecified as to partial versus complete obstruction: Secondary | ICD-10-CM | POA: Diagnosis not present

## 2021-05-19 DIAGNOSIS — J9811 Atelectasis: Secondary | ICD-10-CM | POA: Diagnosis not present

## 2021-05-19 DIAGNOSIS — R112 Nausea with vomiting, unspecified: Principal | ICD-10-CM

## 2021-05-19 DIAGNOSIS — K5651 Intestinal adhesions [bands], with partial obstruction: Secondary | ICD-10-CM | POA: Diagnosis not present

## 2021-05-19 DIAGNOSIS — Z6821 Body mass index (BMI) 21.0-21.9, adult: Secondary | ICD-10-CM

## 2021-05-19 DIAGNOSIS — Z7984 Long term (current) use of oral hypoglycemic drugs: Secondary | ICD-10-CM

## 2021-05-19 DIAGNOSIS — Y838 Other surgical procedures as the cause of abnormal reaction of the patient, or of later complication, without mention of misadventure at the time of the procedure: Secondary | ICD-10-CM | POA: Diagnosis not present

## 2021-05-19 DIAGNOSIS — E876 Hypokalemia: Secondary | ICD-10-CM | POA: Diagnosis not present

## 2021-05-19 DIAGNOSIS — K567 Ileus, unspecified: Secondary | ICD-10-CM | POA: Diagnosis not present

## 2021-05-19 DIAGNOSIS — I82612 Acute embolism and thrombosis of superficial veins of left upper extremity: Secondary | ICD-10-CM | POA: Diagnosis not present

## 2021-05-19 DIAGNOSIS — E1169 Type 2 diabetes mellitus with other specified complication: Secondary | ICD-10-CM | POA: Diagnosis present

## 2021-05-19 DIAGNOSIS — N179 Acute kidney failure, unspecified: Secondary | ICD-10-CM | POA: Diagnosis present

## 2021-05-19 DIAGNOSIS — E119 Type 2 diabetes mellitus without complications: Secondary | ICD-10-CM

## 2021-05-19 DIAGNOSIS — R188 Other ascites: Secondary | ICD-10-CM | POA: Diagnosis present

## 2021-05-19 DIAGNOSIS — Z8616 Personal history of COVID-19: Secondary | ICD-10-CM

## 2021-05-19 DIAGNOSIS — Z79899 Other long term (current) drug therapy: Secondary | ICD-10-CM

## 2021-05-19 DIAGNOSIS — E875 Hyperkalemia: Secondary | ICD-10-CM | POA: Diagnosis not present

## 2021-05-19 DIAGNOSIS — E870 Hyperosmolality and hypernatremia: Secondary | ICD-10-CM | POA: Diagnosis not present

## 2021-05-19 DIAGNOSIS — T8141XA Infection following a procedure, superficial incisional surgical site, initial encounter: Secondary | ICD-10-CM | POA: Diagnosis not present

## 2021-05-19 DIAGNOSIS — E860 Dehydration: Secondary | ICD-10-CM | POA: Diagnosis present

## 2021-05-19 LAB — CBC WITH DIFFERENTIAL/PLATELET
Abs Immature Granulocytes: 0.03 10*3/uL (ref 0.00–0.07)
Basophils Absolute: 0 10*3/uL (ref 0.0–0.1)
Basophils Relative: 0 %
Eosinophils Absolute: 0 10*3/uL (ref 0.0–0.5)
Eosinophils Relative: 0 %
HCT: 30.2 % — ABNORMAL LOW (ref 39.0–52.0)
Hemoglobin: 8.9 g/dL — ABNORMAL LOW (ref 13.0–17.0)
Immature Granulocytes: 0 %
Lymphocytes Relative: 13 %
Lymphs Abs: 1.4 10*3/uL (ref 0.7–4.0)
MCH: 21.9 pg — ABNORMAL LOW (ref 26.0–34.0)
MCHC: 29.5 g/dL — ABNORMAL LOW (ref 30.0–36.0)
MCV: 74.2 fL — ABNORMAL LOW (ref 80.0–100.0)
Monocytes Absolute: 1.1 10*3/uL — ABNORMAL HIGH (ref 0.1–1.0)
Monocytes Relative: 10 %
Neutro Abs: 8.1 10*3/uL — ABNORMAL HIGH (ref 1.7–7.7)
Neutrophils Relative %: 77 %
Platelets: 369 10*3/uL (ref 150–400)
RBC: 4.07 MIL/uL — ABNORMAL LOW (ref 4.22–5.81)
RDW: 18.9 % — ABNORMAL HIGH (ref 11.5–15.5)
WBC: 10.6 10*3/uL — ABNORMAL HIGH (ref 4.0–10.5)
nRBC: 0 % (ref 0.0–0.2)

## 2021-05-19 LAB — MAGNESIUM: Magnesium: 2.3 mg/dL (ref 1.7–2.4)

## 2021-05-19 LAB — CREATININE, SERUM
Creatinine, Ser: 2.23 mg/dL — ABNORMAL HIGH (ref 0.61–1.24)
Creatinine, Ser: 1.91 mg/dL — ABNORMAL HIGH (ref 0.61–1.24)
GFR, Estimated: 32 mL/min — ABNORMAL LOW (ref >60)
GFR, Estimated: 38 mL/min — ABNORMAL LOW (ref >60)

## 2021-05-19 LAB — COMPREHENSIVE METABOLIC PANEL
ALT: 12 U/L (ref 0–44)
AST: 24 U/L (ref 15–41)
Albumin: 4.7 g/dL (ref 3.5–5.0)
Alkaline Phosphatase: 72 U/L (ref 38–126)
Anion gap: 13 (ref 5–15)
BUN: 26 mg/dL — ABNORMAL HIGH (ref 8–23)
CO2: 26 mmol/L (ref 22–32)
Calcium: 9.9 mg/dL (ref 8.9–10.3)
Chloride: 97 mmol/L — ABNORMAL LOW (ref 98–111)
Creatinine, Ser: 2.05 mg/dL — ABNORMAL HIGH (ref 0.61–1.24)
GFR, Estimated: 35 mL/min — ABNORMAL LOW (ref >60)
Glucose, Bld: 191 mg/dL — ABNORMAL HIGH (ref 70–99)
Potassium: 3.5 mmol/L (ref 3.5–5.1)
Sodium: 136 mmol/L (ref 135–145)
Total Bilirubin: 1 mg/dL (ref 0.3–1.2)
Total Protein: 8.6 g/dL — ABNORMAL HIGH (ref 6.5–8.1)

## 2021-05-19 MED ORDER — LACTATED RINGERS IV BOLUS
1000.0000 mL | Freq: Once | INTRAVENOUS | Status: AC
Start: 2021-05-19 — End: 2021-05-19
  Administered 2021-05-19: 1000 mL via INTRAVENOUS

## 2021-05-19 MED ORDER — ONDANSETRON 4 MG PO TBDP: 8.0000 mg | ORAL_TABLET | Freq: Once | ORAL | Status: DC

## 2021-05-19 NOTE — ED Provider Triage Note (Signed)
Emergency Medicine Provider Triage Evaluation Note ? ?Justin Yang , a 66 y.o. male  was evaluated in triage.  Pt complains of "food poisoning". Patient reports he ate a bacon, egg and cheese breakfast sandwich yesterday from gas station and 1 hour later began having nausea, vomiting and abdominal cramping. Patient reports he often eats food from this gas station, has never had this reaction. Patient denies diarrhea, fevers. Patient denies OTC meds prior to arrival.  ? ?Review of Systems  ?Positive: As stated above  ?Negative: As stated above ? ?Physical Exam  ?BP 128/90 (BP Location: Right Arm)   Pulse (!) 117   Temp 98.4 ?F (36.9 ?C) (Oral)   Resp 16   SpO2 99%  ?Gen:   Awake, no distress  ?Resp:  Normal effort  ?MSK:   Moves extremities without difficulty  ?Other:   ? ?Medical Decision Making  ?Medically screening exam initiated at 8:13 AM.  Appropriate orders placed.  Justin Yang was informed that the remainder of the evaluation will be completed by another provider, this initial triage assessment does not replace that evaluation, and the importance of remaining in the ED until their evaluation is complete. ? ? ?  ?Azucena Cecil, PA-C ?05/19/21 [id number] ? ?

## 2021-05-19 NOTE — ED Provider Notes (Signed)
?Patient is a 66 y.o. male with a history of prediabetic who presents to the emergency department for acute onset multiple episode of emesis associated with nausea.  Patient vital signs are within the reference range.  Physical exam without acute abdomen.  Patient is well-appearing.  GCS of 15.  He is alert oriented without any focal deficit. ? ?Patient presentation most likely gastroenteritis.  Less concern for acute etiology such as hepatobiliary etiology or pancreatitis at this time.  Less concern for small bowel obstruction at this time.  Patient is passing gas and having good bowel movement.  Less concern for other serious etiology such as mesenteric ischemia.  Less concern for serious bacterial infection at this time. ? ?Patient CBC without leukocytosis.  Hemoglobin hematocrit are low but stable for patient.  CMP remarkable for elevated creatinine at 2.05.  Per chart review patient did have his lab work done at the [place] on 4/7 with creatinine of 1.2.  His presentation is concerning for AKI most likely secondary to prerenal.  We will give a bolus of LR and repeat creatinine level. ? ?After bolus of LR, patient creatinine improved to 1.9. ? ?On reassessment, patient did have an episode of emesis.  I have ordered him IV Zofran for nausea.  I believe patient will require admission for AKI. ? ?Care of this patient was overseen by the attending physician of record for this patient who verbalized agreement with the plan. ? ?I performed chart review as discussed in the MDM ? ?Medical Decision Making ?Problems Addressed: ?AKI (acute kidney injury) [facility]): acute illness or injury that poses a threat to life or bodily functions ?Nausea and vomiting, unspecified vomiting type: acute illness or injury that poses a threat to life or bodily functions ? ?Amount and/or Complexity of Data Reviewed ?Labs: ordered. Decision-making details documented in ED Course. ? ?Risk ?Decision regarding hospitalization. ? ? ? ?HPI: ? ?Patient  is a 66 y.o. male with a history of prediabetic who presents to the emergency department for cute onset multiple episode of emesis associated with nausea.  He reports he ate a burger that he bought from a gas station f 4 hours prior to his symptom.  Since yesterday, he had about 12 episodes of emesis that is nonbloody.  He does have associated abdominal cramping but no pain.  Denies episode of diarrhea.  He currently feels better and does not feel nauseous enough to receive medication.  Denies associated chest pain or shortness of breath.  Denies fever, cough, or congestion.  He report he otherwise feels well.  No urinary symptoms.  No abdominal injury.  He is having good amount of bowel movement and passing gas without a problem. Otherwise no other complaints. ? ?ROS included in the HPI. ?Please see MAR for past medical history, surgical history, and social history. ? ?Past Medical History:  ?Diagnosis Date  ? Diabetes mellitus without complication (HCC)   ? ? ?Vitals:  ? 05/19/21 2219 05/19/21 2315  ?BP: 132/78 121/88  ?Pulse: 84 100  ?Resp: 18   ?Temp:    ?SpO2: 100% 99%  ? ? ?Physical Exam: ?General: Well appearing, nontoxic, no acute distress; ?Head: Normocephalic Atraumatic; ?Eyes: PERRL, EOMI; ?ENT: Mildly dry, airway patent, no stridor; uvula midline ?Neck: supple, no meningismus; trachea midline; ?Chest: Lungs clear to auscultation bilateral; No wheezing ?Cardiac: Regular rate and rhythm, 2+pulses bilaterally in upper and lower extremities ?Abdomen: soft, nontender, nondistended; no guarding, rebound, or tenderness to percussion; ?Musculoskeletal:  Good muscle tone in upper and  lower extremities, 5/5 strength ?Skin: No rash, normal skin tone; no clubbing, no cyanosis, no edema ?Neuro: Alert times oriented x4; No focal deficit, CN 2-12 symmetric and grossly intact; sensation intact in upper and lower extremities ? ? ? ?Procedures ? ?No orders to display  ? ? ?Clinical Impression:  ?1. Nausea and vomiting,  unspecified vomiting type   ?2. AKI (acute kidney injury) (HCC)   ? ? ?Admit ? ? ?  ?Jari Sportsman, MD ?05/20/21 0016 ? ?  ?Milagros Loll, MD ?05/20/21 1605 ? ?

## 2021-05-19 NOTE — ED Triage Notes (Signed)
Pt reports onset of abd cramping and n/v yesterday. Denies diarrhea.  ?

## 2021-05-20 ENCOUNTER — Inpatient Hospital Stay (HOSPITAL_COMMUNITY): Payer: No Typology Code available for payment source

## 2021-05-20 ENCOUNTER — Observation Stay (HOSPITAL_COMMUNITY): Payer: No Typology Code available for payment source

## 2021-05-20 DIAGNOSIS — J9811 Atelectasis: Secondary | ICD-10-CM | POA: Diagnosis not present

## 2021-05-20 DIAGNOSIS — E119 Type 2 diabetes mellitus without complications: Secondary | ICD-10-CM

## 2021-05-20 DIAGNOSIS — E785 Hyperlipidemia, unspecified: Secondary | ICD-10-CM | POA: Diagnosis present

## 2021-05-20 DIAGNOSIS — I82612 Acute embolism and thrombosis of superficial veins of left upper extremity: Secondary | ICD-10-CM | POA: Diagnosis not present

## 2021-05-20 DIAGNOSIS — K5641 Fecal impaction: Secondary | ICD-10-CM | POA: Diagnosis not present

## 2021-05-20 DIAGNOSIS — R188 Other ascites: Secondary | ICD-10-CM | POA: Diagnosis present

## 2021-05-20 DIAGNOSIS — K567 Ileus, unspecified: Secondary | ICD-10-CM | POA: Diagnosis not present

## 2021-05-20 DIAGNOSIS — E1100 Type 2 diabetes mellitus with hyperosmolarity without nonketotic hyperglycemic-hyperosmolar coma (NKHHC): Secondary | ICD-10-CM | POA: Diagnosis not present

## 2021-05-20 DIAGNOSIS — K56609 Unspecified intestinal obstruction, unspecified as to partial versus complete obstruction: Secondary | ICD-10-CM | POA: Diagnosis present

## 2021-05-20 DIAGNOSIS — E876 Hypokalemia: Secondary | ICD-10-CM | POA: Diagnosis not present

## 2021-05-20 DIAGNOSIS — D509 Iron deficiency anemia, unspecified: Secondary | ICD-10-CM | POA: Diagnosis not present

## 2021-05-20 DIAGNOSIS — Z6821 Body mass index (BMI) 21.0-21.9, adult: Secondary | ICD-10-CM | POA: Diagnosis not present

## 2021-05-20 DIAGNOSIS — E1169 Type 2 diabetes mellitus with other specified complication: Secondary | ICD-10-CM | POA: Diagnosis present

## 2021-05-20 DIAGNOSIS — E870 Hyperosmolality and hypernatremia: Secondary | ICD-10-CM | POA: Diagnosis not present

## 2021-05-20 DIAGNOSIS — K5651 Intestinal adhesions [bands], with partial obstruction: Secondary | ICD-10-CM | POA: Diagnosis not present

## 2021-05-20 DIAGNOSIS — Y838 Other surgical procedures as the cause of abnormal reaction of the patient, or of later complication, without mention of misadventure at the time of the procedure: Secondary | ICD-10-CM | POA: Diagnosis not present

## 2021-05-20 DIAGNOSIS — K565 Intestinal adhesions [bands], unspecified as to partial versus complete obstruction: Secondary | ICD-10-CM | POA: Diagnosis not present

## 2021-05-20 DIAGNOSIS — E875 Hyperkalemia: Secondary | ICD-10-CM | POA: Diagnosis not present

## 2021-05-20 DIAGNOSIS — Z8616 Personal history of COVID-19: Secondary | ICD-10-CM | POA: Diagnosis not present

## 2021-05-20 DIAGNOSIS — Z79899 Other long term (current) drug therapy: Secondary | ICD-10-CM | POA: Diagnosis not present

## 2021-05-20 DIAGNOSIS — N179 Acute kidney failure, unspecified: Secondary | ICD-10-CM | POA: Diagnosis not present

## 2021-05-20 DIAGNOSIS — E440 Moderate protein-calorie malnutrition: Secondary | ICD-10-CM | POA: Diagnosis present

## 2021-05-20 DIAGNOSIS — K5669 Other partial intestinal obstruction: Secondary | ICD-10-CM | POA: Diagnosis not present

## 2021-05-20 DIAGNOSIS — E860 Dehydration: Secondary | ICD-10-CM | POA: Diagnosis present

## 2021-05-20 DIAGNOSIS — R112 Nausea with vomiting, unspecified: Secondary | ICD-10-CM | POA: Diagnosis not present

## 2021-05-20 DIAGNOSIS — Z7984 Long term (current) use of oral hypoglycemic drugs: Secondary | ICD-10-CM | POA: Diagnosis not present

## 2021-05-20 DIAGNOSIS — D62 Acute posthemorrhagic anemia: Secondary | ICD-10-CM | POA: Diagnosis not present

## 2021-05-20 DIAGNOSIS — R509 Fever, unspecified: Secondary | ICD-10-CM | POA: Diagnosis not present

## 2021-05-20 DIAGNOSIS — T8141XA Infection following a procedure, superficial incisional surgical site, initial encounter: Secondary | ICD-10-CM | POA: Diagnosis not present

## 2021-05-20 LAB — FOLATE: Folate: 29.9 ng/mL (ref >5.9)

## 2021-05-20 LAB — CBC
HCT: 30.4 % — ABNORMAL LOW (ref 39.0–52.0)
Hemoglobin: 8.8 g/dL — ABNORMAL LOW (ref 13.0–17.0)
MCH: 21.5 pg — ABNORMAL LOW (ref 26.0–34.0)
MCHC: 28.9 g/dL — ABNORMAL LOW (ref 30.0–36.0)
MCV: 74.3 fL — ABNORMAL LOW (ref 80.0–100.0)
Platelets: 313 10*3/uL (ref 150–400)
RBC: 4.09 MIL/uL — ABNORMAL LOW (ref 4.22–5.81)
RDW: 18.6 % — ABNORMAL HIGH (ref 11.5–15.5)
WBC: 7 10*3/uL (ref 4.0–10.5)
nRBC: 0 % (ref 0.0–0.2)

## 2021-05-20 LAB — CBG MONITORING, ED
Glucose-Capillary: 172 mg/dL — ABNORMAL HIGH (ref 70–99)
Glucose-Capillary: 175 mg/dL — ABNORMAL HIGH (ref 70–99)
Glucose-Capillary: 113 mg/dL — ABNORMAL HIGH (ref 70–99)
Glucose-Capillary: 148 mg/dL — ABNORMAL HIGH (ref 70–99)

## 2021-05-20 LAB — LIPASE, BLOOD: Lipase: 34 U/L (ref 11–51)

## 2021-05-20 LAB — BASIC METABOLIC PANEL
Anion gap: 14 (ref 5–15)
Anion gap: 12 (ref 5–15)
BUN: 36 mg/dL — ABNORMAL HIGH (ref 8–23)
BUN: 41 mg/dL — ABNORMAL HIGH (ref 8–23)
CO2: 24 mmol/L (ref 22–32)
CO2: 29 mmol/L (ref 22–32)
Calcium: 9.4 mg/dL (ref 8.9–10.3)
Calcium: 9.3 mg/dL (ref 8.9–10.3)
Chloride: 95 mmol/L — ABNORMAL LOW (ref 98–111)
Chloride: 96 mmol/L — ABNORMAL LOW (ref 98–111)
Creatinine, Ser: 2.14 mg/dL — ABNORMAL HIGH (ref 0.61–1.24)
Creatinine, Ser: 2.18 mg/dL — ABNORMAL HIGH (ref 0.61–1.24)
GFR, Estimated: 33 mL/min — ABNORMAL LOW (ref >60)
GFR, Estimated: 32 mL/min — ABNORMAL LOW (ref >60)
Glucose, Bld: 153 mg/dL — ABNORMAL HIGH (ref 70–99)
Glucose, Bld: 114 mg/dL — ABNORMAL HIGH (ref 70–99)
Potassium: 3.5 mmol/L (ref 3.5–5.1)
Potassium: 3.4 mmol/L — ABNORMAL LOW (ref 3.5–5.1)
Sodium: 133 mmol/L — ABNORMAL LOW (ref 135–145)
Sodium: 137 mmol/L (ref 135–145)

## 2021-05-20 LAB — IRON AND TIBC
Iron: 25 ug/dL — ABNORMAL LOW (ref 45–182)
Saturation Ratios: 5 % — ABNORMAL LOW (ref 17.9–39.5)
TIBC: 459 ug/dL — ABNORMAL HIGH (ref 250–450)
UIBC: 434 ug/dL

## 2021-05-20 LAB — FERRITIN: Ferritin: 7 ng/mL — ABNORMAL LOW (ref 24–336)

## 2021-05-20 LAB — GLUCOSE, CAPILLARY
Glucose-Capillary: 111 mg/dL — ABNORMAL HIGH (ref 70–99)
Glucose-Capillary: 137 mg/dL — ABNORMAL HIGH (ref 70–99)
Glucose-Capillary: 137 mg/dL — ABNORMAL HIGH (ref 70–99)

## 2021-05-20 LAB — HEMOGLOBIN A1C
Hgb A1c MFr Bld: 5.8 % — ABNORMAL HIGH (ref 4.8–5.6)
Mean Plasma Glucose: 119.76 mg/dL

## 2021-05-20 LAB — VITAMIN B12: Vitamin B-12: 195 pg/mL (ref 180–914)

## 2021-05-20 LAB — HIV ANTIBODY (ROUTINE TESTING W REFLEX): HIV Screen 4th Generation wRfx: NONREACTIVE

## 2021-05-20 MED ORDER — INSULIN ASPART 100 UNIT/ML IJ SOLN
0.0000 [IU] | INTRAMUSCULAR | Status: DC
  Administered 2021-05-20 (×2): 1 [IU] via SUBCUTANEOUS
  Administered 2021-05-20: 2 [IU] via SUBCUTANEOUS
  Administered 2021-05-20 – 2021-05-24 (×8): 1 [IU] via SUBCUTANEOUS

## 2021-05-20 MED ORDER — ACETAMINOPHEN 650 MG RE SUPP: 650.0000 mg | Freq: Four times a day (QID) | RECTAL | Status: DC | PRN

## 2021-05-20 MED ORDER — SENNOSIDES-DOCUSATE SODIUM 8.6-50 MG PO TABS: 1.0000 | ORAL_TABLET | Freq: Two times a day (BID) | ORAL | Status: DC

## 2021-05-20 MED ORDER — ONDANSETRON HCL 4 MG/2ML IJ SOLN
4.0000 mg | Freq: Four times a day (QID) | INTRAMUSCULAR | Status: DC | PRN
  Administered 2021-05-20 – 2021-05-31 (×3): 4 mg via INTRAVENOUS
  Filled 2021-05-20 (×3): qty 2

## 2021-05-20 MED ORDER — FLEET ENEMA 7-19 GM/118ML RE ENEM
1.0000 | ENEMA | Freq: Two times a day (BID) | RECTAL | Status: DC
  Administered 2021-05-20 – 2021-05-23 (×7): 1 via RECTAL
  Filled 2021-05-20 (×7): qty 1

## 2021-05-20 MED ORDER — ACETAMINOPHEN 325 MG PO TABS: 650.0000 mg | ORAL_TABLET | Freq: Four times a day (QID) | ORAL | Status: DC | PRN

## 2021-05-20 MED ORDER — ONDANSETRON HCL 4 MG PO TABS: 4.0000 mg | ORAL_TABLET | Freq: Four times a day (QID) | ORAL | Status: DC | PRN

## 2021-05-20 MED ORDER — HYDROMORPHONE HCL 1 MG/ML IJ SOLN
1.0000 mg | INTRAMUSCULAR | Status: DC | PRN
  Administered 2021-05-21: 1 mg via INTRAVENOUS
  Filled 2021-05-20 (×2): qty 1

## 2021-05-20 MED ORDER — DIATRIZOATE MEGLUMINE & SODIUM 66-10 % PO SOLN
90.0000 mL | Freq: Once | ORAL | Status: AC
  Administered 2021-05-20: 90 mL via NASOGASTRIC
  Filled 2021-05-20: qty 90

## 2021-05-20 MED ORDER — BISACODYL 10 MG RE SUPP
10.0000 mg | Freq: Two times a day (BID) | RECTAL | Status: DC
Start: 2021-05-20 — End: 2021-05-21
  Administered 2021-05-20: 10 mg via RECTAL
  Filled 2021-05-20 (×2): qty 1

## 2021-05-20 MED ORDER — HEPARIN SODIUM (PORCINE) 5000 UNIT/ML IJ SOLN
5000.0000 [IU] | Freq: Three times a day (TID) | INTRAMUSCULAR | Status: DC
  Administered 2021-05-20 – 2021-05-24 (×14): 5000 [IU] via SUBCUTANEOUS
  Filled 2021-05-20 (×14): qty 1

## 2021-05-20 MED ORDER — LACTATED RINGERS IV SOLN: INTRAVENOUS | Status: AC

## 2021-05-20 NOTE — Assessment & Plan Note (Addendum)
-  Presented with nausea, vomiting, abdominal pain, H/o SBO ? requiring lysis of adhesions.  ?-KUB -high-grade SBO,  Moderate amount of stool in the colon and large amount of stool in the rectum also noted. ?-Keep n.p.o. ?-Place NG tube  --was clamped, reinitiating wall suction for decompression ?-Per surgery continuing enema, manual disimpaction ?Encouraging ambulation ?General surgery following closely-hoping to avoid surgical intervention ? ?-Continue IVF ?-Antiemetics and analgesics as needed ?-Discussed with general surgery, Dr. Thermon Leyland, on admission ?

## 2021-05-20 NOTE — ED Notes (Signed)
Per report from [facility], pt w 4-5 BM's this morning. Pt w/ 1 large formed, solid BM and 3 large liquid BM's after bowel regimen. ?

## 2021-05-20 NOTE — Progress Notes (Signed)
General Surgery ? ?Patient has had multiple liquid bowel movements this morning. Feeling better with NG decompression. Abdomen is soft. KUB shows improvement in small bowel dilation, persistent large stool burden in the colon. Patient is likely having liquid bowel movements around impacted stool ball in rectum. Recommend continued enemas to help break up firm stool. ?

## 2021-05-20 NOTE — Consult Note (Signed)
? ?Consulting Physician: Nickola Major Nayan Proch ? ?Referring Provider: Dr. Posey Pronto - [facility] ? ?Chief Complaint: nausea, vomiting and abdominal pain ? ?Reason for Consult: Bowel obstruction ? ? ?Subjective  ? ?HPI: ?Justin Yang is an 66 y.o. male who is here for nausea, vomiting and abdominal pain.  He struggles with constipation but keeps his bowels moving as long as he takes a stool softener. He had bowel movements yesterday.  Nothing the day of admission bu the was passing gas.  He developed abdominal pain, nausea and vomiting.  He threw up many times yesterday and now his abdomen just hurts from throwing up so much.   ? ?Past Medical History:  ?Diagnosis Date  ? Diabetes mellitus without complication ([place])   ? ? ?Past Surgical History:  ?Procedure Laterality Date  ? LAPAROSCOPY N/A 06/28/2015  ? Procedure: LAPAROSCOPY DIAGNOSTIC;  Surgeon: Coralie Keens, MD;  Location: [facility];  Service: General;  Laterality: N/A;  ? LAPAROTOMY N/A 06/28/2015  ? Procedure: LYSIS OF ADHESIONS;  Surgeon: Coralie Keens, MD;  Location: [facility];  Service: General;  Laterality: N/A;  ? SHOULDER SURGERY    ? ? ?History reviewed. No pertinent family history. ? ?Social:  reports that he has never smoked. He has never used smokeless tobacco. He reports current alcohol use. He reports that he does not use drugs. ? ?Allergies: No Known Allergies ? ?Medications: ?Current Outpatient Medications  ?Medication Instructions  ? atorvastatin (LIPITOR) 40 mg, Oral, Daily at bedtime  ? cetirizine (ZYRTEC) 10 mg, Oral, At bedtime PRN  ? DSS 100 mg, Oral, 2 times daily PRN  ? gabapentin (NEURONTIN) 800 mg, Oral, 3 times daily  ? HYDROcodone-acetaminophen (NORCO/VICODIN) 5-325 MG tablet 1-2 tablets, Oral, Every 6 hours PRN  ? metFORMIN (GLUCOPHAGE-XR) 1,000 mg, Oral, 2 times daily with meals  ? omeprazole (PRILOSEC) 20 mg, Oral, Daily PRN  ? ? ?ROS - all of the below systems have been reviewed with the patient and positives are indicated with  bold text ?General: chills, fever or night sweats ?Eyes: blurry vision or double vision ?ENT: epistaxis or sore throat ?Allergy/Immunology: itchy/watery eyes or nasal congestion ?Hematologic/Lymphatic: bleeding problems, blood clots or swollen lymph nodes ?Endocrine: temperature intolerance or unexpected weight changes ?Breast: new or changing breast lumps or nipple discharge ?Resp: cough, shortness of breath, or wheezing ?CV: chest pain or dyspnea on exertion ?GI: as per HPI ?GU: dysuria, trouble voiding, or hematuria ?MSK: joint pain or joint stiffness ?Neuro: TIA or stroke symptoms ?Derm: pruritus and skin lesion changes ?Psych: anxiety and depression ? ?Objective  ? ?PE ?Blood pressure 121/88, pulse 100, temperature 97.7 ?F (36.5 ?C), temperature source Oral, resp. rate 18, SpO2 99 %. ?Constitutional: NAD; conversant; no deformities ?Eyes: Moist conjunctiva; no lid lag; anicteric; PERRL ?Neck: Trachea midline; no thyromegaly ?Lungs: Normal respiratory effort; no tactile fremitus ?CV: RRR; no palpable thrills; no pitting edema ?GI: Abd Distended, mild tenderness; no palpable hepatosplenomegaly ?MSK: Normal range of motion of extremities; no clubbing/cyanosis ?Psychiatric: Appropriate affect; alert and oriented x3 ?Lymphatic: No palpable cervical or axillary lymphadenopathy ? ?Results for orders placed or performed during the hospital encounter of 05/19/21 (from the past 24 hour(s))  ?Comprehensive metabolic panel     Status: Abnormal  ? Collection Time: 05/19/21  8:21 AM  ?Result Value Ref Range  ? Sodium 136 135 - 145 mmol/L  ? Potassium 3.5 3.5 - 5.1 mmol/L  ? Chloride 97 (L) 98 - 111 mmol/L  ? CO2 26 22 - 32 mmol/L  ?  Glucose, Bld 191 (H) 70 - 99 mg/dL  ? BUN 26 (H) 8 - 23 mg/dL  ? Creatinine, Ser 2.05 (H) 0.61 - 1.24 mg/dL  ? Calcium 9.9 8.9 - 10.3 mg/dL  ? Total Protein 8.6 (H) 6.5 - 8.1 g/dL  ? Albumin 4.7 3.5 - 5.0 g/dL  ? AST 24 15 - 41 U/L  ? ALT 12 0 - 44 U/L  ? Alkaline Phosphatase 72 38 - 126 U/L  ?  Total Bilirubin 1.0 0.3 - 1.2 mg/dL  ? GFR, Estimated 35 (L) >60 mL/min  ? Anion gap 13 5 - 15  ?CBC with Differential     Status: Abnormal  ? Collection Time: 05/19/21  8:21 AM  ?Result Value Ref Range  ? WBC 10.6 (H) 4.0 - 10.5 K/uL  ? RBC 4.07 (L) 4.22 - 5.81 MIL/uL  ? Hemoglobin 8.9 (L) 13.0 - 17.0 g/dL  ? HCT 30.2 (L) 39.0 - 52.0 %  ? MCV 74.2 (L) 80.0 - 100.0 fL  ? MCH 21.9 (L) 26.0 - 34.0 pg  ? MCHC 29.5 (L) 30.0 - 36.0 g/dL  ? RDW 18.9 (H) 11.5 - 15.5 %  ? Platelets 369 150 - 400 K/uL  ? nRBC 0.0 0.0 - 0.2 %  ? Neutrophils Relative % 77 %  ? Neutro Abs 8.1 (H) 1.7 - 7.7 K/uL  ? Lymphocytes Relative 13 %  ? Lymphs Abs 1.4 0.7 - 4.0 K/uL  ? Monocytes Relative 10 %  ? Monocytes Absolute 1.1 (H) 0.1 - 1.0 K/uL  ? Eosinophils Relative 0 %  ? Eosinophils Absolute 0.0 0.0 - 0.5 K/uL  ? Basophils Relative 0 %  ? Basophils Absolute 0.0 0.0 - 0.1 K/uL  ? Immature Granulocytes 0 %  ? Abs Immature Granulocytes 0.03 0.00 - 0.07 K/uL  ?Magnesium     Status: None  ? Collection Time: 05/19/21  9:08 PM  ?Result Value Ref Range  ? Magnesium 2.3 1.7 - 2.4 mg/dL  ?Creatinine, serum     Status: Abnormal  ? Collection Time: 05/19/21  9:08 PM  ?Result Value Ref Range  ? Creatinine, Ser 2.23 (H) 0.61 - 1.24 mg/dL  ? GFR, Estimated 32 (L) >60 mL/min  ?Creatinine, serum     Status: Abnormal  ? Collection Time: 05/19/21 10:34 PM  ?Result Value Ref Range  ? Creatinine, Ser 1.91 (H) 0.61 - 1.24 mg/dL  ? GFR, Estimated 38 (L) >60 mL/min  ?CBG monitoring, ED     Status: Abnormal  ? Collection Time: 05/20/21  1:10 AM  ?Result Value Ref Range  ? Glucose-Capillary 172 (H) 70 - 99 mg/dL  ? ? ? ?Imaging Orders    ?     DG ABD ACUTE 2+V W 1V CHEST    ?1. Multiple dilated loops of small bowel in the abdomen measuring up ?to 6.9 cm in diameter, concerning for high-grade small bowel ?obstruction. CT is recommended for further evaluation. ?2. No definite evidence of free air. ?3. Moderate amount of retained stool in the colon and large amount ?of  stool in the rectum. ?4. No acute process in the chest. ? ? ?     CT ABDOMEN PELVIS WO CONTRAST    ? ?Assessment and Plan  ? ?Justin Yang is an 66 y.o. male with nausea, vomiting and abdominal pain secondary to a bowel obstruction and fecal impaction.  I recommend disimpaction enemas and dulcolax suppositories as well as the small bowel protocol with NG tube placement, decompression and enteral contrast  administration with follow up x-rays.  Hopefully he responds to these treatments and can avoid surgery.  He will be admitted to the hospitalist service who will help manage his AKI and other medical issues. ? ? ?Felicie Morn, MD ? ?[facility] ?Use [email or web address] to contact on call provider ? ?New Patient Billing: ?[phone] - High MDM ? ? ?

## 2021-05-20 NOTE — Assessment & Plan Note (Addendum)
Patient with chronic microcytic anemia with hemoglobin 8.9, at baseline.  No obvious bleeding. ?-Iron studies total iron 25 low, TIBC 459, ferritin 7, ?Globin 8.8, ?Folate, B12 within normal limits ?

## 2021-05-20 NOTE — Assessment & Plan Note (Signed)
Hold statin while NPO. ?

## 2021-05-20 NOTE — ED Provider Notes (Signed)
Contacted by Radiology re CT findings of SBO.  D/w Dr. Dossie Der with General Surgery - he is aware of the patient.   ?  ?Tilden Fossa, MD ?05/20/21 [phone] ? ?

## 2021-05-20 NOTE — Hospital Course (Signed)
Justin Yang is a 66 y.o. male with medical history significant for T2DM, hyperlipidemia, chronic microcytic anemia, history of SBO requiring lysis of adhesions who is admitted with small bowel obstruction.  General surgery to consult. ?

## 2021-05-20 NOTE — Progress Notes (Signed)
?PROGRESS NOTE ? ? ? ?Patient: Justin HaversEllis D Ybarbo                            PCP: Patient, No Pcp Per (Inactive)                    ?DOB: Feb 02, 1956            DOA: 05/19/2021 ?[id number]             DOS: 05/20/2021, 12:43 PM ? ? LOS: 0 days  ? ?Date of Service: The patient was seen and examined on 05/20/2021 ? ?Subjective:  ? ?The patient was seen and examined this morning. ?The next stable, reporting improved chest pain, improved nausea vomiting ?Still n.p.o. has not tried any oral intake yet ?G-tube is in place ?Reporting of bowel movements with enema earlier this morning. ? ?Brief Narrative:  ? ?Justin Haversllis D Yang is a 66 y.o. male with medical history significant for T2DM, hyperlipidemia, chronic microcytic anemia, history of SBO requiring lysis of adhesions who is admitted with small bowel obstruction.  General surgery to consult. ? ? ? ?Assessment & Plan:  ? ?Principal Problem: ?  Small bowel obstruction (HCC) ?Active Problems: ?  Acute kidney injury (HCC) ?  Type 2 diabetes mellitus (HCC) ?  Microcytic anemia ?  Hyperlipidemia associated with type 2 diabetes mellitus (HCC) ? ? ? ? ?Assessment and Plan: ?* Small bowel obstruction (HCC) ?-Presented with nausea, vomiting, abdominal pain, H/o SBO ? requiring lysis of adhesions.  ?-KUB -high-grade SBO,  Moderate amount of stool in the colon and large amount of stool in the rectum also noted. ?-Keep n.p.o. ?-Place NG tube ?-Continue IV fluid hydration overnight ?-Antiemetics and analgesics as needed ?-Discussed with general surgery, Dr. Dossie DerStechschulte, ?They initiated disimpaction with enema, Dulcolax suppository, small bowel protocol with NG tube placement, decompression and Antero contrast administration with a follow-up chest x-ray--in the hope that he will respond, and avoid surgery ?-Thus far he has responded well, multiple bouts of bowel movements, started having bowel sounds and gas ? ?Acute kidney injury (HCC) ?Creatinine 2.05 on admission, last creatinine 1.10 in  01/2018.  Likely related to GI losses and poor oral intake in setting of SBO. ?-Continue IV fluid hydration overnight ?-Monitor strict I/O's ?-Hold metformin and avoid NSAIDs ?-Creatinine 1.91, 2.14, >>  2.18 today ? ?Type 2 diabetes mellitus (HCC) ?-Holding metformin ?-We will check his CBG q 4 hours with SSI coverage ?-A1c 5.8, ? ?Microcytic anemia ?Patient with chronic microcytic anemia with hemoglobin 8.9, at baseline.  No obvious bleeding. ?-Iron studies total iron 25 low, TIBC 459, ferritin 7, ?Globin 8.8, ?Folate, B12 within normal limits ? ?Hyperlipidemia associated with type 2 diabetes mellitus (HCC) ?Hold statin while NPO. ? ? ? ? ? ?----------------------------------------------------------------------------------------------------------------- ? ?DVT prophylaxis:  ?heparin injection 5,000 Units Start: 05/20/21 0045 ? ? ?Code Status:   Code Status: Full Code ? ?Family Communication: No family member present at bedside- attempt will be made to update daily ?The above findings and plan of care has been discussed with patient (and family)  in detail,  ?they expressed understanding and agreement of above. ?-Advance care planning has been discussed.  ? ?Admission status:   ?Status is: Inpatient ?Remains inpatient appropriate because: Needing further evaluation by cardiology and gastroenterologist for abnormal CT findings, intractable nausea vomiting ? ? ? ? ?Procedures:  ? ?No admission procedures for hospital encounter. ? ? ?Antimicrobials:  ?Anti-infectives (From admission, onward)  ? ?  None  ? ?  ? ? ? ?Medication:  ? bisacodyl  10 mg Rectal BID  ? heparin  5,000 Units Subcutaneous Q8H  ? insulin aspart  0-9 Units Subcutaneous Q4H  ? sodium phosphate  1 enema Rectal BID  ? ? ?acetaminophen **OR** acetaminophen, HYDROmorphone (DILAUDID) injection, ondansetron **OR** ondansetron (ZOFRAN) IV ? ? ?Objective:  ? ?Vitals:  ? 05/20/21 0745 05/20/21 0900 05/20/21 1000 05/20/21 1226  ?BP: 106/80 95/62 128/85 111/82   ?Pulse: (!) 110 (!) 103 98 92  ?Resp: 20  18 20   ?Temp:    98.4 ?F (36.9 ?C)  ?TempSrc:    Oral  ?SpO2: 99% 98% 99% 100%  ?Weight: 93 kg     ?Height: 5\' 11"  (1.803 m)     ? ? ?Intake/Output Summary (Last 24 hours) at 05/20/2021 1243 ?Last data filed at 05/20/2021 0518 ?Gross per 24 hour  ?Intake --  ?Output 1300 ml  ?Net -1300 ml  ? ?Filed Weights  ? 05/20/21 0745  ?Weight: 93 kg  ? ? ? ?Examination:  ? ?Physical Exam  ?Constitution:  Alert, cooperative, no distress,  Appears calm and comfortable  ?Psychiatric:   Normal and stable mood and affect, cognition intact,   ?HEENT:        Normocephalic, PERRL, otherwise with in Normal limits  ?Chest:         Chest symmetric ?Cardio vascular:  S1/S2, RRR, No murmure, No Rubs or Gallops  ?pulmonary: Clear to auscultation bilaterally, respirations unlabored, negative wheezes / crackles ?Abdomen: Soft, non-tender, non-distended, bowel sounds,no masses, no organomegaly ?Muscular skeletal: Limited exam - in bed, able to move all 4 extremities,   ?Neuro: CNII-XII intact. , normal motor and sensation, reflexes intact  ?Extremities: No pitting edema lower extremities, +2 pulses  ?Skin: Dry, warm to touch, negative for any Rashes, No open wounds ?Wounds: per nursing documentation ? ? ?------------------------------------------------------------------------------------------------------------------------------------------ ?  ? ?LABs:  ? ?  Latest Ref Rng & Units 05/20/2021  ?  2:25 AM 05/19/2021  ?  8:21 AM 01/22/2018  ?  4:00 PM  ?CBC  ?WBC 4.0 - 10.5 K/uL 7.0   10.6     ?Hemoglobin 13.0 - 17.0 g/dL 8.8   8.9   9.5    ?Hematocrit 39.0 - 52.0 % 30.4   30.2   28.0    ?Platelets 150 - 400 K/uL 313   369     ? ? ?  Latest Ref Rng & Units 05/20/2021  ?  8:45 AM 05/20/2021  ?  2:25 AM 05/19/2021  ? 10:34 PM  ?CMP  ?Glucose 70 - 99 mg/dL 05/22/2021   05/21/2021     ?BUN 8 - 23 mg/dL 41   36     ?Creatinine 0.61 - 1.24 mg/dL 657   846   9.62    ?Sodium 135 - 145 mmol/L 137   133     ?Potassium 3.5 - 5.1  mmol/L 3.4   3.5     ?Chloride 98 - 111 mmol/L 96   95     ?CO2 22 - 32 mmol/L 29   24     ?Calcium 8.9 - 10.3 mg/dL 9.3   9.4     ? ? ? ? ? ?Micro Results ?No results found for this or any previous visit (from the past 240 hour(s)). ? ?Radiology Reports ?CT ABDOMEN PELVIS WO CONTRAST ? ?Result Date: 05/20/2021 ?CLINICAL DATA:  Abdominal pain. EXAM: CT ABDOMEN AND PELVIS WITHOUT CONTRAST TECHNIQUE: Multidetector CT imaging of the  abdomen and pelvis was performed following the standard protocol without IV contrast. RADIATION DOSE REDUCTION: This exam was performed according to the departmental dose-optimization program which includes automated exposure control, adjustment of the mA and/or kV according to patient size and/or use of iterative reconstruction technique. COMPARISON:  January 22, 2018 FINDINGS: Lower chest: No acute abnormality. Hepatobiliary: No focal liver abnormality is seen. No gallstones, gallbladder wall thickening, or biliary dilatation. Pancreas: Unremarkable. No pancreatic ductal dilatation or surrounding inflammatory changes. Spleen: Normal in size without focal abnormality. Adrenals/Urinary Tract: Adrenal glands are unremarkable. Kidneys are normal in size. Multiple stable lobulated renal cysts are seen within the left kidney. No additional follow-up or imaging is recommended. Bladder is unremarkable. Stomach/Bowel: Stomach is within normal limits. Appendix appears normal. Numerous dilated small bowel loops are seen throughout the abdomen. A transition zone suspected within the medial aspect of the mid right abdomen (axial CT images 33 through 43, CT series 3). A large amount of stool is seen throughout the large bowel. This is most prominent within the sigmoid colon and rectum. Vascular/Lymphatic: Aortic atherosclerosis. No enlarged abdominal or pelvic lymph nodes. Reproductive: Prostate is unremarkable. Other: No abdominal wall hernia or abnormality. No abdominopelvic ascites. Musculoskeletal:  No acute or significant osseous findings. IMPRESSION: 1. Small bowel obstruction with a transition zone suspected within the medial aspect of the mid right abdomen. 2. Large amount of stool throughout the

## 2021-05-20 NOTE — Assessment & Plan Note (Addendum)
Creatinine 2.05 on admission, last creatinine 1.10 in 01/2018.  Likely related to GI losses and poor oral intake in setting of SBO. ?-Continue IV fluid hydration overnight ?-Monitor strict I/O's ?-Hold metformin and avoid NSAIDs ?-Creatinine 1.91, 2.14, >>  2.18 >>2.89 today ?

## 2021-05-20 NOTE — ED Notes (Addendum)
CBG checked and was 175. Glucometer did not register and transfer the data. ?

## 2021-05-20 NOTE — ED Notes (Signed)
Disimpaction checked off as completed in error. Order replaced. ? ?

## 2021-05-20 NOTE — Assessment & Plan Note (Addendum)
-  Holding metformin ?-We will check his CBG q 4 hours with SSI coverage ?-A1c 5.8, ?

## 2021-05-20 NOTE — Progress Notes (Signed)
NEW ADMISSION NOTE ?New Admission Note:  ? ?Arrival Method: ED stretcher ?Mental Orientation: AAOx4 ?Telemetry: n/a ?Assessment: Completed ?Skin: Intact ?IV: LAC ?Pain: 0/10 ?Tubes: NG Tube  ?Safety Measures: Safety Fall Prevention Plan has been given, discussed and signed ?Admission: Completed ?[facility] Orientation: Patient has been orientated to the room, unit and staff.  ?Family: at bedside ? ?Orders have been reviewed and implemented. Will continue to monitor the patient. Call light has been placed within reach and bed alarm has been activated.  ? ?Myrtis Hopping, RN   ?

## 2021-05-20 NOTE — H&P (Signed)
?History and Physical  ? ? ?Justin Yang UEK:[id number] DOB: 1955/02/25 DOA: 05/19/2021 ? ?PCP: Patient, No Pcp Per (Inactive)  ?Patient coming from: Home ? ?I have personally briefly reviewed patient's old medical records in [facility] ? ?Chief Complaint: Nausea and vomiting ? ?HPI: ?Justin Yang is a 66 y.o. male with medical history significant for T2DM, hyperlipidemia, chronic microcytic anemia, history of SBO requiring lysis of adhesions who presented to the ED for evaluation of nausea and vomiting. ? ?Patient reports new onset of frequent nausea and vomiting beginning on 4/12.  He says emesis has been frequent up to 12 times per day with green appearance.  He has not had any blood in his emesis.  He states his last bowel movement was 2 days prior to admission.  He has been passing gas.  He says he only has abdominal pain during his emesis episodes.  He has not been eating or drinking since onset of symptoms.  He says his last meal prior to symptom onset was a bacon, egg, and cheese sandwich from a gas station. ? ?Patient does have a history of SBO in 2017 requiring exploratory laparotomy with lysis of adhesions.  He says his symptoms were more severe at that time. ? ?ED Course  Labs/Imaging on admission: I have personally reviewed following labs and imaging studies. ? ?Initial vitals showed BP 128/90, pulse 117, RR 16, temp 98.4 ?F, SPO2 99% on room air. ? ?Labs show BUN 26, creatinine 2.05 (previously 1.10 01/22/2018), sodium 136, potassium 3.5, bicarb 26, LFTs within normal limits, serum glucose 191, WBC 10.6, hemoglobin 8.9, platelets 369,000, magnesium 2.3. ? ?Patient was given 1 L LR.  The hospitalist service was consulted to admit for further evaluation and management. ? ?Review of Systems: All systems reviewed and are negative except as documented in history of present illness above. ? ? ?Past Medical History:  ?Diagnosis Date  ? Diabetes mellitus without complication (HCC)   ? ? ?Past Surgical  History:  ?Procedure Laterality Date  ? LAPAROSCOPY N/A 06/28/2015  ? Procedure: LAPAROSCOPY DIAGNOSTIC;  Surgeon: Abigail Miyamoto, MD;  Location: [facility] OR;  Service: General;  Laterality: N/A;  ? LAPAROTOMY N/A 06/28/2015  ? Procedure: LYSIS OF ADHESIONS;  Surgeon: Abigail Miyamoto, MD;  Location: [facility] OR;  Service: General;  Laterality: N/A;  ? SHOULDER SURGERY    ? ? ?Social History: ? reports that he has never smoked. He has never used smokeless tobacco. He reports current alcohol use. He reports that he does not use drugs. ? ?No Known Allergies ? ?History reviewed. No pertinent family history. ? ? ?Prior to Admission medications   ?Medication Sig Start Date End Date Taking? Authorizing Provider  ?atorvastatin (LIPITOR) 40 MG tablet Take 40 mg by mouth at bedtime. 05/12/21  Yes [provider]  ?cetirizine (ZYRTEC) 10 MG tablet Take 10 mg by mouth at bedtime as needed for allergies. 05/12/21  Yes [provider]  ?Docusate Sodium (DSS) 100 MG CAPS Take 100 mg by mouth 2 (two) times daily as needed (constipation). 05/12/21  Yes [provider]  ?gabapentin (NEURONTIN) 800 MG tablet Take 800 mg by mouth 3 (three) times daily.   Yes [provider]  ?metFORMIN (GLUCOPHAGE-XR) 500 MG 24 hr tablet Take 1,000 mg by mouth 2 (two) times daily with a meal. 05/12/21  Yes [provider]  ?omeprazole (PRILOSEC) 20 MG capsule Take 20 mg by mouth daily as needed (heartburn). 05/12/21  Yes [provider]  ?HYDROcodone-acetaminophen (NORCO/VICODIN)  5-325 MG tablet Take 1-2 tablets by mouth every 6 (six) hours as needed for moderate pain. ?Patient not taking: Reported on 05/19/2021 07/06/15   Ashok Norris, NP  ? ? ?Physical Exam: ?Vitals:  ? 05/19/21 2100 05/19/21 2145 05/19/21 2219 05/19/21 2315  ?BP: 120/87 113/64 132/78 121/88  ?Pulse: 93 90 84 100  ?Resp:   18   ?Temp:      ?TempSrc:      ?SpO2: 98% 99% 100% 99%  ? ?Constitutional: Resting in bed, NAD, calm, comfortable ?Eyes: PERRL,  lids and conjunctivae normal ?ENMT: Mucous membranes are moist. Posterior pharynx clear of any exudate or lesions.Normal dentition.  ?Neck: normal, supple, no masses. ?Respiratory: clear to auscultation bilaterally, no wheezing, no crackles. Normal respiratory effort. No accessory muscle use.  ?Cardiovascular: Regular rate and rhythm, no murmurs / rubs / gallops. No extremity edema. 2+ pedal pulses. ?Abdomen: Mild generalized tenderness, no masses palpated. Bowel sounds hypoactive.  ?Musculoskeletal: no clubbing / cyanosis. No joint deformity upper and lower extremities. Good ROM, no contractures. Normal muscle tone.  ?Skin: no rashes, lesions, ulcers. No induration ?Neurologic: CN 2-12 grossly intact. Sensation intact. Strength 5/5 in all 4.  ?Psychiatric: Alert and oriented x 3. Normal mood.  ? ?EKG: Not performed. ? ?Assessment/Plan ?Principal Problem: ?  Small bowel obstruction (HCC) ?Active Problems: ?  Acute kidney injury (HCC) ?  Type 2 diabetes mellitus (HCC) ?  Microcytic anemia ?  Hyperlipidemia associated with type 2 diabetes mellitus (HCC) ?  ?Justin Yang is a 66 y.o. male with medical history significant for T2DM, hyperlipidemia, chronic microcytic anemia, history of SBO requiring lysis of adhesions who is admitted with small bowel obstruction.  General surgery to consult. ? ?Assessment and Plan: ?* Small bowel obstruction (HCC) ?Presenting with nausea, vomiting, minimal abdominal pain and last BM 2 days prior to admission.  Has history of SBO requiring lysis of adhesions.  I obtained abdominal x-ray which does show evidence concerning for high-grade small bowel obstruction without definite evidence of free air.  Moderate amount of stool in the colon and large amount of stool in the rectum also noted. ?-Keep n.p.o. ?-Place NG tube ?-Continue IV fluid hydration overnight ?-Antiemetics and analgesics as needed ?-Discussed with general surgery, Dr. Dossie Der, who will evaluate; appreciate  assistance ? ?Acute kidney injury (HCC) ?Creatinine 2.05 on admission, last creatinine 1.10 in 01/2018.  Likely related to GI losses and poor oral intake in setting of SBO. ?-Continue IV fluid hydration overnight ?-Monitor strict I/O's ?-Hold metformin and avoid NSAIDs ? ?Type 2 diabetes mellitus (HCC) ?Hold home metformin and place on SSI every 4 hours while NPO. ? ?Microcytic anemia ?Patient with chronic microcytic anemia with hemoglobin 8.9, at baseline.  No obvious bleeding. ?-Check anemia panel ? ?Hyperlipidemia associated with type 2 diabetes mellitus (HCC) ?Hold statin while NPO. ? ?DVT prophylaxis: heparin injection 5,000 Units Start: 05/20/21 0045 ?Code Status: Full code, confirmed on admission ?Family Communication: Discussed with patient, he has discussed with family ?Disposition Plan: From home, dispo pending clinical progress ?Consults called: General surgery ?Severity of Illness: ?The appropriate patient status for this patient is INPATIENT. Inpatient status is judged to be reasonable and necessary in order to provide the required intensity of service to ensure the patient's safety. The patient's presenting symptoms, physical exam findings, and initial radiographic and laboratory data in the context of their chronic comorbidities is felt to place them at high risk for further clinical deterioration. Furthermore, it is not anticipated that the patient will be  medically stable for discharge from the hospital within 2 midnights of admission.  ? ?* I certify that at the point of admission it is my clinical judgment that the patient will require inpatient hospital care spanning beyond 2 midnights from the point of admission due to high intensity of service, high risk for further deterioration and high frequency of surveillance required.*  ?Darreld McleanVishal Raye Wiens MD ?[facility] ? ?If 7PM-7AM, please contact night-coverage ?[email or web address] ? ?05/20/2021, 1:15 AM  ?

## 2021-05-21 ENCOUNTER — Inpatient Hospital Stay (HOSPITAL_COMMUNITY): Payer: No Typology Code available for payment source

## 2021-05-21 DIAGNOSIS — K56609 Unspecified intestinal obstruction, unspecified as to partial versus complete obstruction: Secondary | ICD-10-CM | POA: Diagnosis not present

## 2021-05-21 DIAGNOSIS — E876 Hypokalemia: Secondary | ICD-10-CM | POA: Diagnosis not present

## 2021-05-21 LAB — BASIC METABOLIC PANEL
Anion gap: 19 — ABNORMAL HIGH (ref 5–15)
BUN: 65 mg/dL — ABNORMAL HIGH (ref 8–23)
CO2: 28 mmol/L (ref 22–32)
Calcium: 8.9 mg/dL (ref 8.9–10.3)
Chloride: 88 mmol/L — ABNORMAL LOW (ref 98–111)
Creatinine, Ser: 2.89 mg/dL — ABNORMAL HIGH (ref 0.61–1.24)
GFR, Estimated: 23 mL/min — ABNORMAL LOW (ref >60)
Glucose, Bld: 108 mg/dL — ABNORMAL HIGH (ref 70–99)
Potassium: 3.3 mmol/L — ABNORMAL LOW (ref 3.5–5.1)
Sodium: 135 mmol/L (ref 135–145)

## 2021-05-21 LAB — GLUCOSE, CAPILLARY
Glucose-Capillary: 101 mg/dL — ABNORMAL HIGH (ref 70–99)
Glucose-Capillary: 113 mg/dL — ABNORMAL HIGH (ref 70–99)
Glucose-Capillary: 129 mg/dL — ABNORMAL HIGH (ref 70–99)
Glucose-Capillary: 132 mg/dL — ABNORMAL HIGH (ref 70–99)
Glucose-Capillary: 149 mg/dL — ABNORMAL HIGH (ref 70–99)
Glucose-Capillary: 150 mg/dL — ABNORMAL HIGH (ref 70–99)

## 2021-05-21 MED ORDER — KCL-LACTATED RINGERS 20 MEQ/L IV SOLN
INTRAVENOUS | Status: DC
Start: 2021-05-21 — End: 2021-05-21
  Filled 2021-05-21: qty 1000

## 2021-05-21 MED ORDER — SENNA 8.6 MG PO TABS
1.0000 | ORAL_TABLET | Freq: Two times a day (BID) | ORAL | Status: DC
  Administered 2021-05-21 – 2021-05-23 (×6): 8.6 mg via ORAL
  Filled 2021-05-21 (×6): qty 1

## 2021-05-21 MED ORDER — SENNA 8.6 MG PO TABS: 1.0000 | ORAL_TABLET | Freq: Every day | ORAL | Status: DC

## 2021-05-21 MED ORDER — MAGNESIUM SULFATE 2 GM/50ML IV SOLN
2.0000 g | Freq: Once | INTRAVENOUS | Status: AC
  Administered 2021-05-21: 2 g via INTRAVENOUS
  Filled 2021-05-21: qty 50

## 2021-05-21 MED ORDER — POTASSIUM CHLORIDE 2 MEQ/ML IV SOLN
INTRAVENOUS | Status: DC
  Administered 2021-05-24: 100 mL via INTRAVENOUS
  Administered 2021-05-24: 400 mL via INTRAVENOUS
  Filled 2021-05-21 (×10): qty 1000

## 2021-05-21 MED ORDER — LACTATED RINGERS IV SOLN: INTRAVENOUS | Status: DC

## 2021-05-21 NOTE — Progress Notes (Addendum)
Initial Nutrition Assessment ? ?DOCUMENTATION CODES:  ?Not applicable ? ?INTERVENTION:  ?Advance diet to clear liquids as able pending return of bowel function and removal of NGT ?Will add nutrition supplements pending diet advancement ?Consider TPN if pt is to be NPO for several more days ? ?NUTRITION DIAGNOSIS:  ?Inadequate oral intake related to inability to eat as evidenced by NPO status. ? ?GOAL:  ? ?Patient will meet greater than or equal to 90% of their needs ? ?MONITOR:  ? ?Diet advancement, I & O's ? ?REASON FOR ASSESSMENT:  ? ?Malnutrition Screening Tool ?  ? ?ASSESSMENT:  ? ?66 y.o. male with hx of DM type 2, HLD, and hx of SBO presented to ED with nausea and vomiting for ~ 2 weeks. Poor PO during this time. Found to have AKI and imaging suggestive of SBO. ? ?Pt had NGT placed for decompression, hopeful to avoid surgical intervention. Imaging showed significant stool burden. Enemas, suppositories, and manual disimpaction ordered. Pt having liquid stools at this time. ? ?Attempted to call pt on room phone, no answer at this time. Will follow pt for diet advancement. If pt is to remain NPO for several more days or requires surgery, would likely benefit from initiation of TPN ? ?Limited recent weight hx in chart, but pt has lost weight over the last several years. Pt reports poor intake x 2 weeks due to nausea and vomiting prior to admission. ? ?Nutritionally Relevant Medications: ?Scheduled Meds: ? insulin aspart  0-9 Units Subcutaneous Q4H  ? senna  1 tablet Oral BID  ? sodium phosphate  1 enema Rectal BID  ? ?Continuous Infusions: ? lactated ringers with kcl 125 mL/hr at 05/21/21 1344  ? magnesium sulfate bolus IVPB 2 g (05/21/21 1347)  ? ?PRN Meds: ondansetron ? ?Labs Reviewed: ?Potassium 3.3 ?BUN 65, creatinine 2.89 ?HgbA1c 5.8% ?CBG ranges from 111-150 mg/dL over the last 24 hours ? ?Vitamin Labs ? Iron 25 (low) ?Folate 29.9 (WNL) ?Vitamin B12 195 (WNL) ? ?NUTRITION - FOCUSED PHYSICAL EXAM: ?Defer to  in-person assessment ? ?Diet Order:   ?Diet Order   ? ?       ?  Diet NPO time specified Except for: Ice Chips  Diet effective now       ?  ? ?  ?  ? ?  ? ? ?EDUCATION NEEDS:  ? ?No education needs have been identified at this time ? ?Skin:  Skin Assessment: Reviewed RN Assessment ? ?Last BM:  4/15, type 7 ? ?Height:  ? ?Ht Readings from Last 1 Encounters:  ?05/20/21 5\' 11"  (1.803 m)  ? ? ?Weight:  ? ?Wt Readings from Last 1 Encounters:  ?05/20/21 72.3 kg  ? ? ?Ideal Body Weight:  78.2 kg ? ?BMI:  Body mass index is 22.23 kg/m?. ? ?Estimated Nutritional Needs:  ? ?Kcal:  2000-2200 kcal/d ? ?Protein:  100-110 g/d ? ?Fluid:  >/=2.2 L/d ? ? ?05/22/21, RD, LDN ?Clinical Dietitian ?RD pager # available in AMION  ?After hours/weekend pager # available in AMION ?

## 2021-05-21 NOTE — Progress Notes (Signed)
Patient is now attached to low wall intermittent suction to Right nare NG Tube. ?Small liquid bowel movement.  ?MD aware.  ? ?Jean Rosenthal, RN ?

## 2021-05-21 NOTE — Progress Notes (Signed)
? ? ?Assessment & Plan: ?Small bowel obstruction, fecal impaction ? - NPO, NG decompression ? - AXR this AM pending ? - continue enemas, manual disimpaction ? - encourage OOB, ambulation ? - will follow ? ?      Darnell Level, MD ?      [facility] ?      Office: [phone] ? ? ?Chief Complaint: ?Small bowel obstruction, fecal impaction ? ?Subjective: ?Patient in bed, having liquid stools.  "Belly is tight" ? ?Objective: ?Vital signs in last 24 hours: ?Temp:  [97.3 ?F (36.3 ?C)-98.5 ?F (36.9 ?C)] 98.5 ?F (36.9 ?C) (04/15 0405) ?Pulse Rate:  [92-107] 101 (04/15 0405) ?Resp:  [15-20] 16 (04/15 0405) ?BP: (95-128)/(62-87) 105/69 (04/15 0405) ?SpO2:  [95 %-100 %] 95 % (04/15 0405) ?Weight:  [70.8 kg-72.3 kg] 72.3 kg (04/14 1949) ?Last BM Date : 05/20/21 ? ?Intake/Output from previous day: ?04/14 0701 - 04/15 0700 ?In: 1011.2 [I.V.:1011.2] ?Out: -  ?Intake/Output this shift: ?No intake/output data recorded. ? ?Physical Exam: ?HEENT - sclerae clear, mucous membranes moist ?Neck - soft ?Abdomen - moderate distension, soft, minimal tenderness, no mass; rare BS present ?Ext - no edema, non-tender ?Neuro - alert & oriented, no focal deficits ? ?Lab Results:  ?Recent Labs  ?  05/19/21 ?0821 05/20/21 ?0225  ?WBC 10.6* 7.0  ?HGB 8.9* 8.8*  ?HCT 30.2* 30.4*  ?PLT 369 313  ? ?BMET ?Recent Labs  ?  05/20/21 ?0225 05/20/21 ?0845  ?NA 133* 137  ?K 3.5 3.4*  ?CL 95* 96*  ?CO2 24 29  ?GLUCOSE 153* 114*  ?BUN 36* 41*  ?CREATININE 2.14* 2.18*  ?CALCIUM 9.4 9.3  ? ?PT/INR ?No results for input(s): LABPROT, INR in the last 72 hours. ?Comprehensive Metabolic Panel: ?   ?Component Value Date/Time  ? NA 137 05/20/2021 0845  ? NA 133 (L) 05/20/2021 0225  ? K 3.4 (L) 05/20/2021 0845  ? K 3.5 05/20/2021 0225  ? CL 96 (L) 05/20/2021 0845  ? CL 95 (L) 05/20/2021 0225  ? CO2 29 05/20/2021 0845  ? CO2 24 05/20/2021 0225  ? BUN 41 (H) 05/20/2021 0845  ? BUN 36 (H) 05/20/2021 0225  ? CREATININE 2.18 (H) 05/20/2021 0845  ?  CREATININE 2.14 (H) 05/20/2021 0225  ? GLUCOSE 114 (H) 05/20/2021 0845  ? GLUCOSE 153 (H) 05/20/2021 0225  ? CALCIUM 9.3 05/20/2021 0845  ? CALCIUM 9.4 05/20/2021 0225  ? AST 24 05/19/2021 0821  ? AST 15 01/22/2018 1538  ? ALT 12 05/19/2021 0821  ? ALT 11 01/22/2018 1538  ? ALKPHOS 72 05/19/2021 0821  ? ALKPHOS 92 01/22/2018 1538  ? BILITOT 1.0 05/19/2021 0821  ? BILITOT 0.6 01/22/2018 1538  ? PROT 8.6 (H) 05/19/2021 8295  ? PROT 7.6 01/22/2018 1538  ? ALBUMIN 4.7 05/19/2021 0821  ? ALBUMIN 3.9 01/22/2018 1538  ? ? ?Studies/Results: ?CT ABDOMEN PELVIS WO CONTRAST ? ?Result Date: 05/20/2021 ?CLINICAL DATA:  Abdominal pain. EXAM: CT ABDOMEN AND PELVIS WITHOUT CONTRAST TECHNIQUE: Multidetector CT imaging of the abdomen and pelvis was performed following the standard protocol without IV contrast. RADIATION DOSE REDUCTION: This exam was performed according to the departmental dose-optimization program which includes automated exposure control, adjustment of the mA and/or kV according to patient size and/or use of iterative reconstruction technique. COMPARISON:  January 22, 2018 FINDINGS: Lower chest: No acute abnormality. Hepatobiliary: No focal liver abnormality is seen. No gallstones, gallbladder wall thickening, or biliary dilatation. Pancreas: Unremarkable. No pancreatic ductal dilatation or surrounding inflammatory changes. Spleen:  Normal in size without focal abnormality. Adrenals/Urinary Tract: Adrenal glands are unremarkable. Kidneys are normal in size. Multiple stable lobulated renal cysts are seen within the left kidney. No additional follow-up or imaging is recommended. Bladder is unremarkable. Stomach/Bowel: Stomach is within normal limits. Appendix appears normal. Numerous dilated small bowel loops are seen throughout the abdomen. A transition zone suspected within the medial aspect of the mid right abdomen (axial CT images 33 through 43, CT series 3). A large amount of stool is seen throughout the large  bowel. This is most prominent within the sigmoid colon and rectum. Vascular/Lymphatic: Aortic atherosclerosis. No enlarged abdominal or pelvic lymph nodes. Reproductive: Prostate is unremarkable. Other: No abdominal wall hernia or abnormality. No abdominopelvic ascites. Musculoskeletal: No acute or significant osseous findings. IMPRESSION: 1. Small bowel obstruction with a transition zone suspected within the medial aspect of the mid right abdomen. 2. Large amount of stool throughout the large bowel, with impacted stool noted within the sigmoid colon and rectum. 3. Aortic atherosclerosis. Aortic Atherosclerosis (ICD10-I70.0). Electronically Signed   By: Aram Candela M.D.   On: 05/20/2021 02:02  ? ?DG ABD ACUTE 2+V W 1V CHEST ? ?Addendum Date: 05/20/2021   ?ADDENDUM REPORT: 05/20/2021 01:03 ADDENDUM: Surgical consultation is also recommended. Critical findings were reported to Dr. Darreld Mclean at 1:03 a.m. Electronically Signed   By: Thornell Sartorius M.D.   On: 05/20/2021 01:03  ? ?Result Date: 05/20/2021 ?CLINICAL DATA:  Abdominal cramping with nausea and vomiting. EXAM: DG ABDOMEN ACUTE WITH 1 VIEW CHEST COMPARISON:  01/22/2018. FINDINGS: There are multiple dilated loops of small bowel in the abdomen measuring up to 6.9 cm in diameter. A moderate amount of retained stool is present in the colon and there is a large amount of stool in the rectum. No free air is seen beneath the diaphragm. No radiopaque calculi. Heart size and mediastinal contours are within normal limits. Both lungs are clear. IMPRESSION: 1. Multiple dilated loops of small bowel in the abdomen measuring up to 6.9 cm in diameter, concerning for high-grade small bowel obstruction. CT is recommended for further evaluation. 2. No definite evidence of free air. 3. Moderate amount of retained stool in the colon and large amount of stool in the rectum. 4. No acute process in the chest. Electronically Signed: By: Thornell Sartorius M.D. On: 05/20/2021 00:56   ? ?DG Abd Portable 1V-Small Bowel Obstruction Protocol-initial, 8 hr delay ? ?Result Date: 05/20/2021 ?CLINICAL DATA:  Small bowel obstruction. EXAM: PORTABLE ABDOMEN - 1 VIEW COMPARISON:  Same day. FINDINGS: Distal tip of nasogastric tube is seen in proximal stomach. Decreased small bowel dilatation is noted suggesting improving obstruction. Stool is noted in the colon. IMPRESSION: Decreased small bowel dilatation is noted suggesting improving obstruction. Electronically Signed   By: Lupita Raider M.D.   On: 05/20/2021 12:20  ? ?DG Abd Portable 1V-Small Bowel Protocol-Position Verification ? ?Result Date: 05/20/2021 ?CLINICAL DATA:  Check gastric catheter placement EXAM: PORTABLE ABDOMEN - 1 VIEW COMPARISON:  05/20/2021 FINDINGS: Persistent small bowel dilatation is noted similar to that seen on prior CT and plain film examination. Gastric catheter is now seen with the tip in the stomach. Proximal side port lies in the distal esophagus. This should be advanced several cm deeper into the stomach. IMPRESSION: Persistent small bowel obstruction. Gastric catheter as described which should be advanced deeper into the stomach. Electronically Signed   By: Alcide Clever M.D.   On: 05/20/2021 02:44   ? ? ? ?Darnell Level ?05/21/2021 ? ?  Patient ID: Sallyanne Haversllis D Brosch, male   DOB: 01-04-1956, [age] y.o.   MRN: [id number] ? ?

## 2021-05-21 NOTE — Assessment & Plan Note (Signed)
-   Repleting with IV KCL ?

## 2021-05-21 NOTE — Progress Notes (Signed)
?PROGRESS NOTE ? ? ? ?Patient: Justin Yang                            PCP: Patient, No Pcp Per (Inactive)                    ?DOB: 1955-08-23            DOA: 05/19/2021 ?OZH:[id number]             DOS: 05/21/2021, 12:10 PM ? ? LOS: 1 day  ? ?Date of Service: The patient was seen and examined on 05/21/2021 ? ?Subjective:  ? ?The patient was seen and examined this morning, nasogastric tube clamped, denies any nausea or vomiting... But complaining of abdominal distention ?Per nursing staff with last enema aside with liquid stool and small stool he did not have any further bowel movements ? ?Brief Narrative:  ? ?Justin Yang is a 66 y.o. male with medical history significant for T2DM, hyperlipidemia, chronic microcytic anemia, history of SBO requiring lysis of adhesions who is admitted with small bowel obstruction.  General surgery to consult. ? ? ? ?Assessment & Plan:  ? ?Principal Problem: ?  Small bowel obstruction (HCC) ?Active Problems: ?  Hypokalemia ?  Acute kidney injury (HCC) ?  Type 2 diabetes mellitus (HCC) ?  Microcytic anemia ?  Hyperlipidemia associated with type 2 diabetes mellitus (HCC) ? ? ? ? ?Assessment and Plan: ?* Small bowel obstruction (HCC) ?-Presented with nausea, vomiting, abdominal pain, H/o SBO ? requiring lysis of adhesions.  ?-KUB -high-grade SBO,  Moderate amount of stool in the colon and large amount of stool in the rectum also noted. ?-Keep n.p.o. ?-Place NG tube  --was clamped, reinitiating wall suction for decompression ?-Per surgery continuing enema, manual disimpaction ?Encouraging ambulation ?General surgery following closely-hoping to avoid surgical intervention ? ?-Continue IVF ?-Antiemetics and analgesics as needed ?-Discussed with general surgery, Dr. Dossie DerStechschulte, on admission ? ?Hypokalemia ?- Repleting with IV KCL ? ?Acute kidney injury (HCC) ?Creatinine 2.05 on admission, last creatinine 1.10 in 01/2018.  Likely related to GI losses and poor oral intake in setting of  SBO. ?-Continue IV fluid hydration overnight ?-Monitor strict I/O's ?-Hold metformin and avoid NSAIDs ?-Creatinine 1.91, 2.14, >>  2.18 >>2.89 today ? ?Type 2 diabetes mellitus (HCC) ?-Holding metformin ?-We will check his CBG q 4 hours with SSI coverage ?-A1c 5.8, ? ?Microcytic anemia ?Patient with chronic microcytic anemia with hemoglobin 8.9, at baseline.  No obvious bleeding. ?-Iron studies total iron 25 low, TIBC 459, ferritin 7, ?Globin 8.8, ?Folate, B12 within normal limits ? ?Hyperlipidemia associated with type 2 diabetes mellitus (HCC) ?Hold statin while NPO. ? ? ? ? ? ?----------------------------------------------------------------------------------------------------------------- ? ?DVT prophylaxis:  ?heparin injection 5,000 Units Start: 05/20/21 0045 ? ? ?Code Status:   Code Status: Full Code ? ?Family Communication: No family member present at bedside- attempt will be made to update daily ?The above findings and plan of care has been discussed with patient (and family)  in detail,  ?they expressed understanding and agreement of above. ?-Advance care planning has been discussed.  ? ?Admission status:   ?Status is: Inpatient ?Remains inpatient appropriate because: Needing further evaluation by cardiology and gastroenterologist for abnormal CT findings, intractable nausea vomiting ? ? ? ? ?Procedures:  ? ?No admission procedures for hospital encounter. ? ? ?Antimicrobials:  ?Anti-infectives (From admission, onward)  ? ? None  ? ?  ? ? ? ?Medication:  ?  heparin  5,000 Units Subcutaneous Q8H  ? insulin aspart  0-9 Units Subcutaneous Q4H  ? senna  1 tablet Oral BID  ? sodium phosphate  1 enema Rectal BID  ? ? ?acetaminophen **OR** acetaminophen, HYDROmorphone (DILAUDID) injection, ondansetron **OR** ondansetron (ZOFRAN) IV ? ? ?Objective:  ? ?Vitals:  ? 05/20/21 1949 05/20/21 2340 05/21/21 0405 05/21/21 0953  ?BP: 123/83 111/76 105/69 117/85  ?Pulse: (!) 105 (!) 107 (!) 101 96  ?Resp: 18 16 16 15   ?Temp: (!)  97.3 ?F (36.3 ?C) 97.8 ?F (36.6 ?C) 98.5 ?F (36.9 ?C) 97.6 ?F (36.4 ?C)  ?TempSrc: Oral Oral Oral Oral  ?SpO2: 98% 96% 95% 97%  ?Weight: 72.3 kg     ?Height:      ? ? ?Intake/Output Summary (Last 24 hours) at 05/21/2021 1210 ?Last data filed at 05/21/2021 1100 ?Gross per 24 hour  ?Intake 1011.19 ml  ?Output 400 ml  ?Net 611.19 ml  ? ?Filed Weights  ? 05/20/21 0745 05/20/21 1643 05/20/21 1949  ?Weight: 93 kg 70.8 kg 72.3 kg  ? ? ? ?Examination:  ? ? ? ? ?Physical Exam: ?  ?General:  AAO x 3,  cooperative, no distress;   ?HEENT:  Normocephalic, PERRL, otherwise with in Normal limits   ?Neuro:  CNII-XII intact. , normal motor and sensation, reflexes intact   ?Lungs:   Clear to auscultation BL, Respirations unlabored,  ?No wheezes / crackles  ?Cardio:    S1/S2, RRR, No murmure, No Rubs or Gallops   ?Abdomen:  Soft, mild diffuse tenderness, distended, hypoactive bowel sounds  ?no guarding or peritoneal signs.  ?Muscular  ?skeletal:  Limited exam -global generalized weaknesses ?- in bed, able to move all 4 extremities,   ?2+ pulses,  symmetric, No pitting edema  ?Skin:  Dry, warm to touch, negative for any Rashes,  ?Wounds: Please see nursing documentation ?   ? ? ?  ?------------------------------------------------------------------------------------------------------------------- ?  ? ?LABs:  ? ?  Latest Ref Rng & Units 05/20/2021  ?  2:25 AM 05/19/2021  ?  8:21 AM 01/22/2018  ?  4:00 PM  ?CBC  ?WBC 4.0 - 10.5 K/uL 7.0   10.6     ?Hemoglobin 13.0 - 17.0 g/dL 8.8   8.9   9.5    ?Hematocrit 39.0 - 52.0 % 30.4   30.2   28.0    ?Platelets 150 - 400 K/uL 313   369     ? ? ?  Latest Ref Rng & Units 05/21/2021  ?  7:47 AM 05/20/2021  ?  8:45 AM 05/20/2021  ?  2:25 AM  ?CMP  ?Glucose 70 - 99 mg/dL 05/22/2021   025   852    ?BUN 8 - 23 mg/dL 65   41   36    ?Creatinine 0.61 - 1.24 mg/dL 778   2.42   3.53    ?Sodium 135 - 145 mmol/L 135   137   133    ?Potassium 3.5 - 5.1 mmol/L 3.3   3.4   3.5    ?Chloride 98 - 111 mmol/L 88   96   95     ?CO2 22 - 32 mmol/L 28   29   24     ?Calcium 8.9 - 10.3 mg/dL 8.9   9.3   9.4    ? ? ? ? ? ?Micro Results ?No results found for this or any previous visit (from the past 240 hour(s)). ? ?Radiology Reports ?DG Abd 1 View ? ?Result Date:  05/21/2021 ?CLINICAL DATA:  Follow-up small bowel obstruction. EXAM: ABDOMEN - 1 VIEW COMPARISON:  05/20/2021, radiographs and CT.  06/25/2015. FINDINGS: Multiple loops of dilated small bowel are without significant change from the previous day's exam. Air and stool is noted in the colon and rectum. Nasal/orogastric tube lies in the proximal stomach. IMPRESSION: 1. Stable appearance from the previous day's exam for findings consistent with a persistent partial small bowel obstruction. Electronically Signed   By: Amie Portland M.D.   On: 05/21/2021 10:32  ? ?DG Abd Portable 1V-Small Bowel Obstruction Protocol-initial, 8 hr delay ? ?Result Date: 05/20/2021 ?CLINICAL DATA:  Small bowel obstruction. EXAM: PORTABLE ABDOMEN - 1 VIEW COMPARISON:  Same day. FINDINGS: Distal tip of nasogastric tube is seen in proximal stomach. Decreased small bowel dilatation is noted suggesting improving obstruction. Stool is noted in the colon. IMPRESSION: Decreased small bowel dilatation is noted suggesting improving obstruction. Electronically Signed   By: Lupita Raider M.D.   On: 05/20/2021 12:20   ? ?SIGNED: Kendell Bane, MD, FHM. ?[facility],  Pager (please use [email or web address] to page/text) ?Please use Epic Secure Chat for non-urgent communication (7AM-7PM) ? ?If 7PM-7AM, please contact night-coverage ?[email or web address], ?05/21/2021, 12:10 PM ? ? ? ? ?

## 2021-05-21 NOTE — Plan of Care (Signed)
  Problem: Education: Goal: Knowledge of General Education information will improve Description: Including pain rating scale, medication(s)/side effects and non-pharmacologic comfort measures Outcome: Progressing   Problem: Clinical Measurements: Goal: Ability to maintain clinical measurements within normal limits will improve Outcome: Progressing   

## 2021-05-22 ENCOUNTER — Inpatient Hospital Stay (HOSPITAL_COMMUNITY): Payer: No Typology Code available for payment source

## 2021-05-22 DIAGNOSIS — D509 Iron deficiency anemia, unspecified: Secondary | ICD-10-CM

## 2021-05-22 DIAGNOSIS — E1100 Type 2 diabetes mellitus with hyperosmolarity without nonketotic hyperglycemic-hyperosmolar coma (NKHHC): Secondary | ICD-10-CM

## 2021-05-22 DIAGNOSIS — E785 Hyperlipidemia, unspecified: Secondary | ICD-10-CM

## 2021-05-22 DIAGNOSIS — R112 Nausea with vomiting, unspecified: Secondary | ICD-10-CM | POA: Diagnosis not present

## 2021-05-22 DIAGNOSIS — E876 Hypokalemia: Secondary | ICD-10-CM

## 2021-05-22 DIAGNOSIS — E1169 Type 2 diabetes mellitus with other specified complication: Secondary | ICD-10-CM | POA: Diagnosis not present

## 2021-05-22 DIAGNOSIS — N179 Acute kidney failure, unspecified: Secondary | ICD-10-CM

## 2021-05-22 DIAGNOSIS — K56609 Unspecified intestinal obstruction, unspecified as to partial versus complete obstruction: Secondary | ICD-10-CM | POA: Diagnosis not present

## 2021-05-22 LAB — CBC WITH DIFFERENTIAL/PLATELET
Abs Immature Granulocytes: 0.01 10*3/uL (ref 0.00–0.07)
Basophils Absolute: 0 10*3/uL (ref 0.0–0.1)
Basophils Relative: 0 %
Eosinophils Absolute: 0 10*3/uL (ref 0.0–0.5)
Eosinophils Relative: 0 %
HCT: 25.3 % — ABNORMAL LOW (ref 39.0–52.0)
Hemoglobin: 7.9 g/dL — ABNORMAL LOW (ref 13.0–17.0)
Immature Granulocytes: 0 %
Lymphocytes Relative: 10 %
Lymphs Abs: 0.6 10*3/uL — ABNORMAL LOW (ref 0.7–4.0)
MCH: 22.4 pg — ABNORMAL LOW (ref 26.0–34.0)
MCHC: 31.2 g/dL (ref 30.0–36.0)
MCV: 71.9 fL — ABNORMAL LOW (ref 80.0–100.0)
Monocytes Absolute: 0.9 10*3/uL (ref 0.1–1.0)
Monocytes Relative: 17 %
Neutro Abs: 3.9 10*3/uL (ref 1.7–7.7)
Neutrophils Relative %: 73 %
Platelets: 294 10*3/uL (ref 150–400)
RBC: 3.52 MIL/uL — ABNORMAL LOW (ref 4.22–5.81)
RDW: 18.3 % — ABNORMAL HIGH (ref 11.5–15.5)
WBC: 5.4 10*3/uL (ref 4.0–10.5)
nRBC: 0 % (ref 0.0–0.2)

## 2021-05-22 LAB — COMPREHENSIVE METABOLIC PANEL
ALT: 11 U/L (ref 0–44)
AST: 15 U/L (ref 15–41)
Albumin: 3.7 g/dL (ref 3.5–5.0)
Alkaline Phosphatase: 58 U/L (ref 38–126)
Anion gap: 14 (ref 5–15)
BUN: 73 mg/dL — ABNORMAL HIGH (ref 8–23)
CO2: 32 mmol/L (ref 22–32)
Calcium: 8.2 mg/dL — ABNORMAL LOW (ref 8.9–10.3)
Chloride: 87 mmol/L — ABNORMAL LOW (ref 98–111)
Creatinine, Ser: 2.32 mg/dL — ABNORMAL HIGH (ref 0.61–1.24)
GFR, Estimated: 30 mL/min — ABNORMAL LOW (ref >60)
Glucose, Bld: 107 mg/dL — ABNORMAL HIGH (ref 70–99)
Potassium: 3.1 mmol/L — ABNORMAL LOW (ref 3.5–5.1)
Sodium: 133 mmol/L — ABNORMAL LOW (ref 135–145)
Total Bilirubin: 1.2 mg/dL (ref 0.3–1.2)
Total Protein: 7.1 g/dL (ref 6.5–8.1)

## 2021-05-22 LAB — GLUCOSE, CAPILLARY
Glucose-Capillary: 107 mg/dL — ABNORMAL HIGH (ref 70–99)
Glucose-Capillary: 108 mg/dL — ABNORMAL HIGH (ref 70–99)
Glucose-Capillary: 114 mg/dL — ABNORMAL HIGH (ref 70–99)
Glucose-Capillary: 90 mg/dL (ref 70–99)
Glucose-Capillary: 97 mg/dL (ref 70–99)
Glucose-Capillary: 98 mg/dL (ref 70–99)

## 2021-05-22 LAB — BASIC METABOLIC PANEL
Anion gap: 16 — ABNORMAL HIGH (ref 5–15)
BUN: 73 mg/dL — ABNORMAL HIGH (ref 8–23)
CO2: 31 mmol/L (ref 22–32)
Calcium: 8.1 mg/dL — ABNORMAL LOW (ref 8.9–10.3)
Chloride: 87 mmol/L — ABNORMAL LOW (ref 98–111)
Creatinine, Ser: 2.61 mg/dL — ABNORMAL HIGH (ref 0.61–1.24)
GFR, Estimated: 26 mL/min — ABNORMAL LOW (ref >60)
Glucose, Bld: 107 mg/dL — ABNORMAL HIGH (ref 70–99)
Potassium: 3.2 mmol/L — ABNORMAL LOW (ref 3.5–5.1)
Sodium: 134 mmol/L — ABNORMAL LOW (ref 135–145)

## 2021-05-22 LAB — MAGNESIUM: Magnesium: 2.9 mg/dL — ABNORMAL HIGH (ref 1.7–2.4)

## 2021-05-22 LAB — PHOSPHORUS: Phosphorus: 5.6 mg/dL — ABNORMAL HIGH (ref 2.5–4.6)

## 2021-05-22 MED ORDER — PHENOL 1.4 % MT LIQD
1.0000 | OROMUCOSAL | Status: DC | PRN
  Administered 2021-05-22: 1 via OROMUCOSAL
  Filled 2021-05-22: qty 177

## 2021-05-22 NOTE — Progress Notes (Addendum)
?PROGRESS NOTE ? ? ? ?Justin Yang  HYI:[id number] DOB: Sep 11, 1955 DOA: 05/19/2021 ?PCP: Patient, No Pcp Per (Inactive)  ? ? ? ?Brief Narrative:  ? 66 y.o. BM PMHx DM Type II controlled, HLD, chronic microcytic anemia, Hx of SBO requiring lysis of adhesions  ? ?Pesented to the ED for evaluation of nausea and vomiting. ? ?Patient reports new onset of frequent nausea and vomiting beginning on 4/12.  He says emesis has been frequent up to 12 times per day with green appearance.  He has not had any blood in his emesis.  He states his last bowel movement was 2 days prior to admission.  He has been passing gas.  He says he only has abdominal pain during his emesis episodes.  He has not been eating or drinking since onset of symptoms.  He says his last meal prior to symptom onset was a bacon, egg, and cheese sandwich from a gas station. ? ?Patient does have a history of SBO in 2017 requiring exploratory laparotomy with lysis of adhesions.  He says his symptoms were more severe at that time. ?  ?ED Course  Labs/Imaging on admission: I have personally reviewed following labs and imaging studies. ?  ?Initial vitals showed BP 128/90, pulse 117, RR 16, temp 98.4 ?F, SPO2 99% on room air. ?  ?Labs show BUN 26, creatinine 2.05 (previously 1.10 01/22/2018), sodium 136, potassium 3.5, bicarb 26, LFTs within normal limits, serum glucose 191, WBC 10.6, hemoglobin 8.9, platelets 369,000, magnesium 2.3. ?  ?Patient was given 1 L LR.  The hospitalist service was consulted to admit for further evaluation and management. ? ? ?Subjective: ?4/16 A/O x4, states had SBO~5 years ago which resulted in surgery.  NG tube clamped.  Negative nausea, negative vomiting, negative abdominal pain. ? ? ?Assessment & Plan: ?Covid vaccination; ?  ?Principal Problem: ?  Small bowel obstruction (HCC) ?Active Problems: ?  Hypokalemia ?  Acute kidney injury (HCC) ?  Type 2 diabetes mellitus (HCC) ?  Microcytic anemia ?  Hyperlipidemia associated with type 2  diabetes mellitus (HCC) ? ?Small bowel obstruction (HCC) ?-Presented with nausea, vomiting, abdominal pain, H/o SBO ? requiring lysis of adhesions.  ?-KUB -high-grade SBO,  Moderate amount of stool in the colon and large amount of stool in the rectum also noted. ?-Keep n.p.o. ?-Place NG tube  --was clamped, reinitiating wall suction for decompression ?-Per surgery continuing enema, manual disimpaction ?Encouraging ambulation ?-General surgery following closely-hoping to avoid surgical intervention ?-Lactated Ringer's 156ml/hr ? -Antiemetics and analgesics as needed ?-Discussed with general surgery, Dr. Dossie Der, on admission ?-4/16 per [facility] continue n.p.o. with ice chips, NG decompression, OOB and ambulation. ?  ?Hypokalemia ?- Repleting with IV KCL ?  ?Acute kidney injury (last Cr 1.10 on 01/2018) * ?Creatinine 2.05 on admission,  ?-Likely related to GI losses and poor oral intake in setting of SBO. ?Lab Results  ?Component Value Date  ? CREATININE 2.32 (H) 05/22/2021  ? CREATININE 2.61 (H) 05/22/2021  ? CREATININE 2.89 (H) 05/21/2021  ? CREATININE 2.18 (H) 05/20/2021  ? CREATININE 2.14 (H) 05/20/2021  ?-Strict ins and out ?- Daily weight ?- Fluid intake see SBO ?-Hold metformin and avoid NSAIDs ?-Creatinine 1.91, 2.14, >>  2.18 >>2.89 today ?  ?Type 2 diabetes mellitus (HCC) ?-Holding metformin ?-We will check his CBG q 4 hours with SSI coverage ?-A1c 5.8, ?  ?Microcytic anemia ?Patient with chronic microcytic anemia with hemoglobin 8.9, at baseline.  No obvious bleeding. ?-Iron studies total iron 25 low,  TIBC 459, ferritin 7, ?Globin 8.8, ?Folate, B12 within normal limits ?  ?Hyperlipidemia associated with type 2 diabetes mellitus (HCC) ?Hold statin while NPO. ?  ? ? ?  ? ? ?Mobility Assessment (last 72 hours)   ? ? Mobility Assessment   ? ? Row Name 05/21/21 2200 05/21/21 0900 05/20/21 2059 05/20/21 1633  ?  ? Does patient have an order for bedrest or is patient medically unstable No - Continue assessment No  - Continue assessment No - Continue assessment No - Continue assessment   ? What is the highest level of mobility based on the progressive mobility assessment? -- Level 4 (Walks with assist in room) - Balance while marching in place and cannot step forward and back - Complete Level 4 (Walks with assist in room) - Balance while marching in place and cannot step forward and back - Complete Level 4 (Walks with assist in room) - Balance while marching in place and cannot step forward and back - Complete   ? ?  ?  ? ?  ? ? ?DVT prophylaxis:  ?Code Status:  ?Family Communication:  ?Status is: Inpatient ? ? ? ?Dispo: The patient is from:  ?             Anticipated d/c is to:  ?             Anticipated d/c date is:  ?             Patient currently  ? ? ? ? ? ?Consultants:  ?CCS ? ?Procedures/Significant Events:  ? ? ?I have personally reviewed and interpreted all radiology studies and my findings are as above. ? ?VENTILATOR SETTINGS: ? ? ? ?Cultures ? ? ?Antimicrobials: ?Anti-infectives (From admission, onward)  ? ? None  ? ?  ?  ? ? ?Devices ?  ? ?LINES / TUBES:  ? ? ? ? ?Continuous Infusions: ? lactated ringers with kcl 125 mL/hr at 05/22/21 0757  ? ? ? ?Objective: ?Vitals:  ? 05/21/21 0953 05/21/21 1719 05/21/21 2047 05/22/21 0539  ?BP: 117/85 121/84 116/83 114/74  ?Pulse: 96 88 94 81  ?Resp: 15 16 20 19   ?Temp: 97.6 ?F (36.4 ?C) (!) 97.4 ?F (36.3 ?C) (!) 97.4 ?F (36.3 ?C) 97.6 ?F (36.4 ?C)  ?TempSrc: Oral Oral Oral Oral  ?SpO2: 97% 98% 93% 97%  ?Weight:      ?Height:      ? ? ?Intake/Output Summary (Last 24 hours) at 05/22/2021 0816 ?Last data filed at 05/22/2021 0600 ?Gross per 24 hour  ?Intake 2383.33 ml  ?Output 800 ml  ?Net 1583.33 ml  ? ?Filed Weights  ? 05/20/21 0745 05/20/21 1643 05/20/21 1949  ?Weight: 93 kg 70.8 kg 72.3 kg  ? ? ?Examination: ? ?General: A/O x4 No acute respiratory distress, cachectic ?Eyes: negative scleral hemorrhage, negative anisocoria, negative icterus ?ENT: Negative Runny nose, negative  gingival bleeding, ?Neck:  Negative scars, masses, torticollis, lymphadenopathy, JVD ?Lungs: Clear to auscultation bilaterally without wheezes or crackles ?Cardiovascular: Regular rate and rhythm without murmur gallop or rub normal S1 and S2 ?Abdomen: negative abdominal pain, nondistended, positive soft, bowel sounds, no rebound, no ascites, no appreciable mass ?Extremities: No significant cyanosis, clubbing, or edema bilateral lower extremities ?Skin: Negative rashes, lesions, ulcers ?Psychiatric:  Negative depression, negative anxiety, negative fatigue, negative mania  ?Central nervous system:  Cranial nerves II through XII intact, tongue/uvula midline, all extremities muscle strength 5/5, sensation intact throughout, negative dysarthria, negative expressive aphasia, negative receptive aphasia. ? ?.  ? ? ? ?  Data Reviewed: Care during the described time interval was provided by me .  I have reviewed this patient's available data, including medical history, events of note, physical examination, and all test results as part of my evaluation. ? ?CBC: ?Recent Labs  ?Lab 05/19/21 ?0821 05/20/21 ?0225  ?WBC 10.6* 7.0  ?NEUTROABS 8.1*  --   ?HGB 8.9* 8.8*  ?HCT 30.2* 30.4*  ?MCV 74.2* 74.3*  ?PLT 369 313  ? ?Basic Metabolic Panel: ?Recent Labs  ?Lab 05/19/21 ?0821 05/19/21 ?2108 05/19/21 ?2234 05/20/21 ?0225 05/20/21 ?[id number] 05/21/21 ?[id number] 05/22/21 ?0435  ?NA 136  --   --  133* 137 135 134*  ?K 3.5  --   --  3.5 3.4* 3.3* 3.2*  ?CL 97*  --   --  95* 96* 88* 87*  ?CO2 26  --   --  24 29 28 31   ?GLUCOSE 191*  --   --  153* 114* 108* 107*  ?BUN 26*  --   --  36* 41* 65* 73*  ?CREATININE 2.05* 2.23* 1.91* 2.14* 2.18* 2.89* 2.61*  ?CALCIUM 9.9  --   --  9.4 9.3 8.9 8.1*  ?MG  --  2.3  --   --   --   --   --   ? ?GFR: ?Estimated Creatinine Clearance: 28.1 mL/min (A) (by C-G formula based on SCr of 2.61 mg/dL (H)). ?Liver Function Tests: ?Recent Labs  ?Lab 05/19/21 ?0821  ?AST 24  ?ALT 12  ?ALKPHOS 72  ?BILITOT 1.0  ?PROT 8.6*   ?ALBUMIN 4.7  ? ?Recent Labs  ?Lab 05/20/21 ?0225  ?LIPASE 34  ? ?No results for input(s): AMMONIA in the last 168 hours. ?Coagulation Profile: ?No results for input(s): INR, PROTIME in the last 168 hours. ?C

## 2021-05-22 NOTE — Progress Notes (Addendum)
Patient's NG tube was clamped after medication administration, and then to go down for his abdominal xray. Patient is tolerating it well and now refusing to have NG tube reconnected to low intermittent suction. Dr.Woods is aware.  ?

## 2021-05-22 NOTE — Progress Notes (Signed)
? ? ?Assessment & Plan: ?Small bowel obstruction, fecal impaction ?            - continued NPO with ice chips, NG decompression ?            - AXR ordered this AM - 2 views ?            - encourage OOB, ambulation ?            - will follow ? ?      Armandina Gemma, MD ?      [facility] ?      Office: [phone] ? ? ?Chief Complaint: ?SBO, fecal impaction ? ?Subjective: ?Patient in bed, comfortable.  Large BM last night per patient.  Wants to eat. ? ?Objective: ?Vital signs in last 24 hours: ?Temp:  [97.4 ?F (36.3 ?C)-97.6 ?F (36.4 ?C)] 97.6 ?F (36.4 ?C) (04/16 0539) ?Pulse Rate:  [81-96] 84 (04/16 0826) ?Resp:  [15-20] 16 (04/16 0826) ?BP: (109-121)/(74-85) 109/77 (04/16 [id number]) ?SpO2:  [93 %-98 %] 97 % (04/16 0826) ?Last BM Date : 05/21/21 ? ?Intake/Output from previous day: ?04/15 0701 - 04/16 0700 ?In: 2383.3 [P.O.:300; I.V.:2033.3; IV Piggyback:50] ?Out: 800 [Emesis/NG output:800] ?Intake/Output this shift: ?No intake/output data recorded. ? ?Physical Exam: ?HEENT - sclerae clear, mucous membranes moist ?Abdomen - softer, less distended; BS present; non-tender ?Ext - no edema, non-tender ?Neuro - alert & oriented, no focal deficits ? ?Lab Results:  ?Recent Labs  ?  05/20/21 ?0225  ?WBC 7.0  ?HGB 8.8*  ?HCT 30.4*  ?PLT 313  ? ?BMET ?Recent Labs  ?  05/21/21 ?[id number] 05/22/21 ?0435  ?NA 135 134*  ?K 3.3* 3.2*  ?CL 88* 87*  ?CO2 28 31  ?GLUCOSE 108* 107*  ?BUN 65* 73*  ?CREATININE 2.89* 2.61*  ?CALCIUM 8.9 8.1*  ? ?PT/INR ?No results for input(s): LABPROT, INR in the last 72 hours. ?Comprehensive Metabolic Panel: ?   ?Component Value Date/Time  ? NA 134 (L) 05/22/2021 0435  ? NA 135 05/21/2021 0747  ? K 3.2 (L) 05/22/2021 0435  ? K 3.3 (L) 05/21/2021 0747  ? CL 87 (L) 05/22/2021 0435  ? CL 88 (L) 05/21/2021 0747  ? CO2 31 05/22/2021 0435  ? CO2 28 05/21/2021 0747  ? BUN 73 (H) 05/22/2021 0435  ? BUN 65 (H) 05/21/2021 0747  ? CREATININE 2.61 (H) 05/22/2021 0435  ? CREATININE 2.89 (H) 05/21/2021 0747   ? GLUCOSE 107 (H) 05/22/2021 0435  ? GLUCOSE 108 (H) 05/21/2021 0747  ? CALCIUM 8.1 (L) 05/22/2021 0435  ? CALCIUM 8.9 05/21/2021 0747  ? AST 24 05/19/2021 0821  ? AST 15 01/22/2018 1538  ? ALT 12 05/19/2021 0821  ? ALT 11 01/22/2018 1538  ? ALKPHOS 72 05/19/2021 0821  ? ALKPHOS 92 01/22/2018 1538  ? BILITOT 1.0 05/19/2021 0821  ? BILITOT 0.6 01/22/2018 1538  ? PROT 8.6 (H) 05/19/2021 [id number]  ? PROT 7.6 01/22/2018 1538  ? ALBUMIN 4.7 05/19/2021 0821  ? ALBUMIN 3.9 01/22/2018 1538  ? ? ?Studies/Results: ?DG Abd 1 View ? ?Result Date: 05/21/2021 ?CLINICAL DATA:  Follow-up small bowel obstruction. EXAM: ABDOMEN - 1 VIEW COMPARISON:  05/20/2021, radiographs and CT.  06/25/2015. FINDINGS: Multiple loops of dilated small bowel are without significant change from the previous day's exam. Air and stool is noted in the colon and rectum. Nasal/orogastric tube lies in the proximal stomach. IMPRESSION: 1. Stable appearance from the previous day's exam for findings consistent with a persistent partial small  bowel obstruction. Electronically Signed   By: Lajean Manes M.D.   On: 05/21/2021 10:32  ? ?DG Abd Portable 1V-Small Bowel Obstruction Protocol-initial, 8 hr delay ? ?Result Date: 05/20/2021 ?CLINICAL DATA:  Small bowel obstruction. EXAM: PORTABLE ABDOMEN - 1 VIEW COMPARISON:  Same day. FINDINGS: Distal tip of nasogastric tube is seen in proximal stomach. Decreased small bowel dilatation is noted suggesting improving obstruction. Stool is noted in the colon. IMPRESSION: Decreased small bowel dilatation is noted suggesting improving obstruction. Electronically Signed   By: Marijo Conception M.D.   On: 05/20/2021 12:20   ? ? ? ?Armandina Gemma ?05/22/2021 ? ? [name], male   DOB: 1956-01-31, 66 y.o.   MRN: [id number] ? ?

## 2021-05-23 ENCOUNTER — Inpatient Hospital Stay (HOSPITAL_COMMUNITY): Payer: No Typology Code available for payment source

## 2021-05-23 DIAGNOSIS — N179 Acute kidney failure, unspecified: Secondary | ICD-10-CM | POA: Diagnosis not present

## 2021-05-23 DIAGNOSIS — E1169 Type 2 diabetes mellitus with other specified complication: Secondary | ICD-10-CM | POA: Diagnosis not present

## 2021-05-23 DIAGNOSIS — R112 Nausea with vomiting, unspecified: Secondary | ICD-10-CM | POA: Diagnosis not present

## 2021-05-23 DIAGNOSIS — K56609 Unspecified intestinal obstruction, unspecified as to partial versus complete obstruction: Secondary | ICD-10-CM | POA: Diagnosis not present

## 2021-05-23 LAB — GLUCOSE, CAPILLARY
Glucose-Capillary: 95 mg/dL (ref 70–99)
Glucose-Capillary: 86 mg/dL (ref 70–99)
Glucose-Capillary: 118 mg/dL — ABNORMAL HIGH (ref 70–99)
Glucose-Capillary: 116 mg/dL — ABNORMAL HIGH (ref 70–99)
Glucose-Capillary: 137 mg/dL — ABNORMAL HIGH (ref 70–99)

## 2021-05-23 LAB — CBC WITH DIFFERENTIAL/PLATELET
Abs Immature Granulocytes: 0.03 10*3/uL (ref 0.00–0.07)
Basophils Absolute: 0 10*3/uL (ref 0.0–0.1)
Basophils Relative: 0 %
Eosinophils Absolute: 0 10*3/uL (ref 0.0–0.5)
Eosinophils Relative: 0 %
HCT: 24.2 % — ABNORMAL LOW (ref 39.0–52.0)
Hemoglobin: 7.4 g/dL — ABNORMAL LOW (ref 13.0–17.0)
Immature Granulocytes: 1 %
Lymphocytes Relative: 9 %
Lymphs Abs: 0.5 10*3/uL — ABNORMAL LOW (ref 0.7–4.0)
MCH: 22.4 pg — ABNORMAL LOW (ref 26.0–34.0)
MCHC: 30.6 g/dL (ref 30.0–36.0)
MCV: 73.3 fL — ABNORMAL LOW (ref 80.0–100.0)
Monocytes Absolute: 1.1 10*3/uL — ABNORMAL HIGH (ref 0.1–1.0)
Monocytes Relative: 19 %
Neutro Abs: 4.1 10*3/uL (ref 1.7–7.7)
Neutrophils Relative %: 71 %
Platelets: 253 10*3/uL (ref 150–400)
RBC: 3.3 MIL/uL — ABNORMAL LOW (ref 4.22–5.81)
RDW: 18.3 % — ABNORMAL HIGH (ref 11.5–15.5)
WBC: 5.6 10*3/uL (ref 4.0–10.5)
nRBC: 0 % (ref 0.0–0.2)

## 2021-05-23 LAB — COMPREHENSIVE METABOLIC PANEL
ALT: 10 U/L (ref 0–44)
AST: 17 U/L (ref 15–41)
Albumin: 3.4 g/dL — ABNORMAL LOW (ref 3.5–5.0)
Alkaline Phosphatase: 52 U/L (ref 38–126)
Anion gap: 14 (ref 5–15)
BUN: 57 mg/dL — ABNORMAL HIGH (ref 8–23)
CO2: 31 mmol/L (ref 22–32)
Calcium: 8.4 mg/dL — ABNORMAL LOW (ref 8.9–10.3)
Chloride: 91 mmol/L — ABNORMAL LOW (ref 98–111)
Creatinine, Ser: 1.92 mg/dL — ABNORMAL HIGH (ref 0.61–1.24)
GFR, Estimated: 38 mL/min — ABNORMAL LOW (ref >60)
Glucose, Bld: 93 mg/dL (ref 70–99)
Potassium: 3 mmol/L — ABNORMAL LOW (ref 3.5–5.1)
Sodium: 136 mmol/L (ref 135–145)
Total Bilirubin: 0.8 mg/dL (ref 0.3–1.2)
Total Protein: 6.6 g/dL (ref 6.5–8.1)

## 2021-05-23 LAB — MAGNESIUM: Magnesium: 2.9 mg/dL — ABNORMAL HIGH (ref 1.7–2.4)

## 2021-05-23 LAB — PHOSPHORUS: Phosphorus: 4.8 mg/dL — ABNORMAL HIGH (ref 2.5–4.6)

## 2021-05-23 MED ORDER — POTASSIUM CHLORIDE 10 MEQ/100ML IV SOLN
10.0000 meq | INTRAVENOUS | Status: AC
  Administered 2021-05-23 (×5): 10 meq via INTRAVENOUS
  Filled 2021-05-23 (×4): qty 100

## 2021-05-23 MED ORDER — DIATRIZOATE MEGLUMINE & SODIUM 66-10 % PO SOLN
90.0000 mL | Freq: Once | ORAL | Status: AC
  Administered 2021-05-23: 90 mL via NASOGASTRIC
  Filled 2021-05-23: qty 90

## 2021-05-23 NOTE — Progress Notes (Signed)
?PROGRESS NOTE ? ? ? ?Justin Yang  ELF:[id number] DOB: Feb 07, 1955 DOA: 05/19/2021 ?PCP: Patient, No Pcp Per (Inactive)  ? ? ? ?Brief Narrative:  ? 66 y.o. BM PMHx DM Type II controlled, HLD, chronic microcytic anemia, Hx of SBO requiring lysis of adhesions  ? ?Pesented to the ED for evaluation of nausea and vomiting. ? ?Patient reports new onset of frequent nausea and vomiting beginning on 4/12.  He says emesis has been frequent up to 12 times per day with green appearance.  He has not had any blood in his emesis.  He states his last bowel movement was 2 days prior to admission.  He has been passing gas.  He says he only has abdominal pain during his emesis episodes.  He has not been eating or drinking since onset of symptoms.  He says his last meal prior to symptom onset was a bacon, egg, and cheese sandwich from a gas station. ? ?Patient does have a history of SBO in 2017 requiring exploratory laparotomy with lysis of adhesions.  He says his symptoms were more severe at that time. ?  ?ED Course  Labs/Imaging on admission: I have personally reviewed following labs and imaging studies. ?  ?Initial vitals showed BP 128/90, pulse 117, RR 16, temp 98.4 ?F, SPO2 99% on room air. ?  ?Labs show BUN 26, creatinine 2.05 (previously 1.10 01/22/2018), sodium 136, potassium 3.5, bicarb 26, LFTs within normal limits, serum glucose 191, WBC 10.6, hemoglobin 8.9, platelets 369,000, magnesium 2.3. ?  ?Patient was given 1 L LR.  The hospitalist service was consulted to admit for further evaluation and management. ? ? ?Subjective: ?4/17 A/O x4.  Negative nausea, negative vomiting, negative abdominal pain.  States he understands that he may require surgery. ? ?Assessment & Plan: ?Covid vaccination; ?  ?Principal Problem: ?  Small bowel obstruction ([place]) ?Active Problems: ?  Hypokalemia ?  Acute kidney injury ([place]) ?  Type 2 diabetes mellitus ([place]) ?  Microcytic anemia ?  Hyperlipidemia associated with type 2 diabetes mellitus  ([place]) ? ?Small bowel obstruction ([place]) ?-Presented with nausea, vomiting, abdominal pain, H/o SBO ? requiring lysis of adhesions.  ?-KUB -high-grade SBO,  Moderate amount of stool in the colon and large amount of stool in the rectum also noted. ?-Keep n.p.o. ?-Place NG tube  --was clamped, reinitiating wall suction for decompression ?-Per surgery continuing enema, manual disimpaction ?Encouraging ambulation ?-General surgery following closely-hoping to avoid surgical intervention ?-Lactated Ringer's 174ml/hr ? -Antiemetics and analgesics as needed ?-Discussed with general surgery, Dr. Thermon Leyland, on admission ?-4/16 per [facility] continue n.p.o. with ice chips, NG decompression, OOB and ambulation. ?-4/17 SBO protocol per surgery.  If it is unsuccessful patient will require surgery to resolve SBO ?  ?Hypokalemia ?-Potassium goal> 4 ?- 4/17 potassium IV 50 mEq ?-4/17 LR+ KCl 20 mEq 156ml/hr ?  ?Acute kidney injury (last Cr 1.10 on 01/2018) * ?Creatinine 2.05 on admission,  ?-Likely related to GI losses and poor oral intake in setting of SBO. ?Lab Results  ?Component Value Date  ? CREATININE 1.92 (H) 05/23/2021  ? CREATININE 2.32 (H) 05/22/2021  ? CREATININE 2.61 (H) 05/22/2021  ? CREATININE 2.89 (H) 05/21/2021  ? CREATININE 2.18 (H) 05/20/2021  ?-Strict ins and out +3.1 L ?- Daily weight ?Filed Weights  ? 05/20/21 0745 05/20/21 1643 05/20/21 1949  ?Weight: 93 kg 70.8 kg 72.3 kg  ?- Fluid intake see SBO ?-Hold metformin and avoid NSAIDs ?-Creatinine 1.91, 2.14, >>  2.18 >>2.89 today ?  ?Type  2 diabetes mellitus ([place]) ?-Holding metformin ?-We will check his CBG q 4 hours with SSI coverage ?-A1c 5.8, ?  ?Microcytic anemia ?Patient with chronic microcytic anemia with hemoglobin 8.9, at baseline.  No obvious bleeding. ?-Iron studies total iron 25 low, TIBC 459, ferritin 7, ?Globin 8.8, ?Folate, B12 within normal limits ?Lab Results  ?Component Value Date  ? HGB 7.4 (L) 05/23/2021  ? HGB 7.9 (L) 05/22/2021  ? HGB 8.8 (L)  05/20/2021  ? HGB 8.9 (L) 05/19/2021  ? HGB 9.5 (L) 01/22/2018  ?-4/17 occult blood pending ?  ?Hyperlipidemia associated with type 2 diabetes mellitus ([place]) ?Hold statin while NPO. ?  ? ? ?  ? ? ?Mobility Assessment (last 72 hours)   ? ? Mobility Assessment   ? ? Row Name 05/22/21 2300 05/22/21 0900 05/21/21 2200 05/21/21 0900 05/20/21 2059  ? Does patient have an order for bedrest or is patient medically unstable No - Continue assessment No - Continue assessment No - Continue assessment No - Continue assessment No - Continue assessment  ? What is the highest level of mobility based on the progressive mobility assessment? -- Level 4 (Walks with assist in room) - Balance while marching in place and cannot step forward and back - Complete -- Level 4 (Walks with assist in room) - Balance while marching in place and cannot step forward and back - Complete Level 4 (Walks with assist in room) - Balance while marching in place and cannot step forward and back - Complete  ? ? [place] Name 05/20/21 1633  ?  ?  ?  ?  ? Does patient have an order for bedrest or is patient medically unstable No - Continue assessment      ? What is the highest level of mobility based on the progressive mobility assessment? Level 4 (Walks with assist in room) - Balance while marching in place and cannot step forward and back - Complete      ? ?  ?  ? ?  ? ? ?DVT prophylaxis:  ?Code Status:  ?Family Communication:  ?Status is: Inpatient ? ? ? ?Dispo: The patient is from: Home ?             Anticipated d/c is to: Home ?             Anticipated d/c date is: > 3 days ?             Patient currently medically unstable ? ? ? ? ? ?Consultants:  ?CCS ? ?Procedures/Significant Events:  ? ? ?I have personally reviewed and interpreted all radiology studies and my findings are as above. ? ?VENTILATOR SETTINGS: ? ? ? ?Cultures ? ? ?Antimicrobials: ?Anti-infectives (From admission, onward)  ? ? None  ? ?  ?  ? ? ?Devices ?  ? ?LINES / TUBES:  ? ? ? ? ?Continuous  Infusions: ? lactated ringers with kcl 125 mL/hr at 05/23/21 0319  ? potassium chloride    ? ? ? ?Objective: ?Vitals:  ? 05/22/21 0826 05/22/21 1634 05/22/21 2050 05/23/21 5465  ?BP: 109/77 114/74 112/72 122/81  ?Pulse: 84 84 78 82  ?Resp: $Remov'16 16 17 18  'CAUcbB$ ?Temp:  98.8 ?F (37.1 ?C) 97.6 ?F (36.4 ?C) 97.6 ?F (36.4 ?C)  ?TempSrc:  Oral Oral Oral  ?SpO2: 97% 98% 97% 98%  ?Weight:      ?Height:      ? ? ?Intake/Output Summary (Last 24 hours) at 05/23/2021 1007 ?Last data filed at 05/23/2021 0800 ?Johney Maine  per 24 hour  ?Intake 2750 ml  ?Output --  ?Net 2750 ml  ? ? ?Filed Weights  ? 05/20/21 0745 05/20/21 1643 05/20/21 1949  ?Weight: 93 kg 70.8 kg 72.3 kg  ? ? ?Examination: ? ?General: A/O x4 No acute respiratory distress, cachectic ?Eyes: negative scleral hemorrhage, negative anisocoria, negative icterus ?ENT: Negative Runny nose, negative gingival bleeding, ?Neck:  Negative scars, masses, torticollis, lymphadenopathy, JVD ?Lungs: Clear to auscultation bilaterally without wheezes or crackles ?Cardiovascular: Regular rate and rhythm without murmur gallop or rub normal S1 and S2 ?Abdomen: negative abdominal pain, nondistended, positive soft, bowel sounds, no rebound, no ascites, no appreciable mass ?Extremities: No significant cyanosis, clubbing, or edema bilateral lower extremities ?Skin: Negative rashes, lesions, ulcers ?Psychiatric:  Negative depression, negative anxiety, negative fatigue, negative mania  ?Central nervous system:  Cranial nerves II through XII intact, tongue/uvula midline, all extremities muscle strength 5/5, sensation intact throughout, negative dysarthria, negative expressive aphasia, negative receptive aphasia. ? ?.  ? ? ? ?Data Reviewed: Care during the described time interval was provided by me .  I have reviewed this patient's available data, including medical history, events of note, physical examination, and all test results as part of my evaluation. ? ?CBC: ?Recent Labs  ?Lab 05/19/21 ?0821  05/20/21 ?0225 05/22/21 ?0959 05/23/21 ?7473  ?WBC 10.6* 7.0 5.4 5.6  ?NEUTROABS 8.1*  --  3.9 4.1  ?HGB 8.9* 8.8* 7.9* 7.4*  ?HCT 30.2* 30.4* 25.3* 24.2*  ?MCV 74.2* 74.3* 71.9* 73.3*  ?PLT 369 313 294 253  ? ? ?Basic Met

## 2021-05-23 NOTE — Progress Notes (Addendum)
?  Transition of Care (TOC) Screening Note ? ? ?Patient Details  ?Name: Justin Yang ?Date of Birth: [birth year] ? ? ?Transition of Care (TOC) CM/SW Contact:    ?Tom-Johnson, Hershal Coria, RN ?Phone Number: ?05/23/2021, 12:44 PM ? ?Patient is admitted for Small Bowel Obstruction. Currently NPO with NG tube. Surgery following.  ?From home with wife. Has three children who lives out of state. 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 Independent with care and drive self prior to admission. Does not have DME's at home.  ?PCP is Dr. Leonor Liv at the [facility] and uses the [facility] pharmacy.  ?Currently active with [facility]. Secure message left for [facility], SW to return call.  ?Transition of Care Department [facility]) has reviewed patient and no TOC needs or recommendations have been identified at this time. TOC will continue to monitor patient advancement through interdisciplinary progression rounds. If new patient transition needs arise, please place a TOC consult. ?  ?

## 2021-05-23 NOTE — Progress Notes (Addendum)
[facility] ?Progress Note ? ?   ?Subjective: ?CC-  ?NG clamped around 1600 yesterday. Patient states he has passed some gas this morning and had a BM with enema early this morning. Denies n/v. States that he got out of bed once yesterday. ?I hooked NG back to suction this morning and got almost 300cc out immediately. ? ?Objective: ?Vital signs in last 24 hours: ?Temp:  [97.6 ?F (36.4 ?C)-98.8 ?F (37.1 ?C)] 97.6 ?F (36.4 ?C) (04/17 1517) ?Pulse Rate:  [78-84] 82 (04/17 0625) ?Resp:  [16-18] 18 (04/17 6160) ?BP: (112-122)/(72-81) 122/81 (04/17 7371) ?SpO2:  [97 %-98 %] 98 % (04/17 0625) ?Last BM Date : 05/23/21 ? ?Intake/Output from previous day: ?04/16 0701 - 04/17 0700 ?In: 2750 [P.O.:250; I.V.:2500] ?Out: 500 [Emesis/NG output:500] ?Intake/Output this shift: ?No intake/output data recorded. ? ?PE: ?Gen:  Alert, NAD, pleasant ?Abd: distended but soft, nontender ? ?Lab Results:  ?Recent Labs  ?  05/22/21 ?0959 05/23/21 ?0626  ?WBC 5.4 5.6  ?HGB 7.9* 7.4*  ?HCT 25.3* 24.2*  ?PLT 294 253  ? ?BMET ?Recent Labs  ?  05/22/21 ?0959 05/23/21 ?9485  ?NA 133* 136  ?K 3.1* 3.0*  ?CL 87* 91*  ?CO2 32 31  ?GLUCOSE 107* 93  ?BUN 73* 57*  ?CREATININE 2.32* 1.92*  ?CALCIUM 8.2* 8.4*  ? ?PT/INR ?No results for input(s): LABPROT, INR in the last 72 hours. ?CMP  ?   ?Component Value Date/Time  ? NA 136 05/23/2021 0514  ? K 3.0 (L) 05/23/2021 0514  ? CL 91 (L) 05/23/2021 0514  ? CO2 31 05/23/2021 0514  ? GLUCOSE 93 05/23/2021 0514  ? BUN 57 (H) 05/23/2021 0514  ? CREATININE 1.92 (H) 05/23/2021 0514  ? CALCIUM 8.4 (L) 05/23/2021 0514  ? PROT 6.6 05/23/2021 0514  ? ALBUMIN 3.4 (L) 05/23/2021 0514  ? AST 17 05/23/2021 0514  ? ALT 10 05/23/2021 0514  ? ALKPHOS 52 05/23/2021 0514  ? BILITOT 0.8 05/23/2021 0514  ? GFRNONAA 38 (L) 05/23/2021 0514  ? GFRAA >60 07/06/2015 0458  ? ?Lipase  ?   ?Component Value Date/Time  ? LIPASE 34 05/20/2021 0225  ? ? ? ? ? ?Studies/Results: ?DG Abd 2 Views ? ?Result Date: 05/22/2021 ?CLINICAL  DATA:  Small-bowel obstruction.  Follow-up exam. EXAM: ABDOMEN - 2 VIEW COMPARISON:  05/21/2021.  CT, 05/20/2021 FINDINGS: Multiple loops of dilated small bowel are similar to the previous day's exam consistent with a persistent partial small bowel obstruction. Air and stool is noted within a normal caliber colon, also stable. No free air. Nasogastric tube curls in the proximal stomach. IMPRESSION: 1. Persistent partial small bowel obstruction. Dilated small bowel is without significant change from the previous day's exam. Electronically Signed   By: Amie Portland M.D.   On: 05/22/2021 14:30   ? ?Anti-infectives: ?Anti-infectives (From admission, onward)  ? ? None  ? ?  ? ? ? ?Assessment/Plan  ?SBO, fecal impaction ?- CT 4/14 showed small bowel obstruction with a transition zone suspected within the medial aspect of the mid right abdomen; large amount of stool throughout the large bowel, with impacted stool noted within the sigmoid colon and rectum ?- SBO protocol started 4/14, follow up films showed air and stool in the colon therefore patient is not fully obstructed. ?- I returned NG to LIWS given high residual. Patient needs to mobilize more. I replaced K. Keep K>4 and Mag>2. Continue enemas. Will repeat SBO protocol with gastrograffin. ? ?ID - none ?FEN - IVF, NPO/NGT to  LIWS ?VTE - sq heparin ?Foley - none ? ?AKI ?DM ?HLD ? ?I reviewed hospitalist notes, last 24 h vitals and pain scores, last 48 h intake and output, last 24 h labs and trends (CBC, CMP, magnesium, phosphorous), and last 24 h imaging results (xray from yesterday) ? ? LOS: 3 days  ? ? ?Franne Forts, PA-C ?[facility] ?05/23/2021, 9:48 AM ?Please see Amion for pager number during day hours 7:00am-4:30pm ? ?

## 2021-05-24 ENCOUNTER — Inpatient Hospital Stay: Payer: Self-pay

## 2021-05-24 ENCOUNTER — Encounter (HOSPITAL_COMMUNITY): Payer: Self-pay | Admitting: Internal Medicine

## 2021-05-24 ENCOUNTER — Inpatient Hospital Stay (HOSPITAL_COMMUNITY): Payer: No Typology Code available for payment source | Admitting: Anesthesiology

## 2021-05-24 ENCOUNTER — Inpatient Hospital Stay (HOSPITAL_COMMUNITY): Payer: No Typology Code available for payment source

## 2021-05-24 ENCOUNTER — Encounter (HOSPITAL_COMMUNITY)
Admission: EM | Disposition: A | Discharge status: Home Health | Payer: Self-pay | Source: Home / Self Care | Attending: Internal Medicine

## 2021-05-24 DIAGNOSIS — E1169 Type 2 diabetes mellitus with other specified complication: Secondary | ICD-10-CM | POA: Diagnosis not present

## 2021-05-24 DIAGNOSIS — K565 Intestinal adhesions [bands], unspecified as to partial versus complete obstruction: Secondary | ICD-10-CM

## 2021-05-24 DIAGNOSIS — K56609 Unspecified intestinal obstruction, unspecified as to partial versus complete obstruction: Secondary | ICD-10-CM | POA: Diagnosis not present

## 2021-05-24 DIAGNOSIS — N179 Acute kidney failure, unspecified: Secondary | ICD-10-CM | POA: Diagnosis not present

## 2021-05-24 DIAGNOSIS — R112 Nausea with vomiting, unspecified: Secondary | ICD-10-CM | POA: Diagnosis not present

## 2021-05-24 HISTORY — PX: LAPAROTOMY: SHX154

## 2021-05-24 LAB — CBC WITH DIFFERENTIAL/PLATELET
Abs Immature Granulocytes: 0.01 10*3/uL (ref 0.00–0.07)
Basophils Absolute: 0 10*3/uL (ref 0.0–0.1)
Basophils Relative: 0 %
Eosinophils Absolute: 0 10*3/uL (ref 0.0–0.5)
Eosinophils Relative: 0 %
HCT: 25.1 % — ABNORMAL LOW (ref 39.0–52.0)
Hemoglobin: 7.4 g/dL — ABNORMAL LOW (ref 13.0–17.0)
Immature Granulocytes: 0 %
Lymphocytes Relative: 14 %
Lymphs Abs: 0.5 10*3/uL — ABNORMAL LOW (ref 0.7–4.0)
MCH: 21.9 pg — ABNORMAL LOW (ref 26.0–34.0)
MCHC: 29.5 g/dL — ABNORMAL LOW (ref 30.0–36.0)
MCV: 74.3 fL — ABNORMAL LOW (ref 80.0–100.0)
Monocytes Absolute: 0.8 10*3/uL (ref 0.1–1.0)
Monocytes Relative: 23 %
Neutro Abs: 2.2 10*3/uL (ref 1.7–7.7)
Neutrophils Relative %: 63 %
Platelets: 253 10*3/uL (ref 150–400)
RBC: 3.38 MIL/uL — ABNORMAL LOW (ref 4.22–5.81)
RDW: 18.3 % — ABNORMAL HIGH (ref 11.5–15.5)
WBC: 3.5 10*3/uL — ABNORMAL LOW (ref 4.0–10.5)
nRBC: 0 % (ref 0.0–0.2)

## 2021-05-24 LAB — COMPREHENSIVE METABOLIC PANEL
ALT: 9 U/L (ref 0–44)
AST: 13 U/L — ABNORMAL LOW (ref 15–41)
Albumin: 3.2 g/dL — ABNORMAL LOW (ref 3.5–5.0)
Alkaline Phosphatase: 54 U/L (ref 38–126)
Anion gap: 12 (ref 5–15)
BUN: 33 mg/dL — ABNORMAL HIGH (ref 8–23)
CO2: 35 mmol/L — ABNORMAL HIGH (ref 22–32)
Calcium: 8.2 mg/dL — ABNORMAL LOW (ref 8.9–10.3)
Chloride: 91 mmol/L — ABNORMAL LOW (ref 98–111)
Creatinine, Ser: 1.59 mg/dL — ABNORMAL HIGH (ref 0.61–1.24)
GFR, Estimated: 47 mL/min — ABNORMAL LOW (ref >60)
Glucose, Bld: 105 mg/dL — ABNORMAL HIGH (ref 70–99)
Potassium: 2.9 mmol/L — ABNORMAL LOW (ref 3.5–5.1)
Sodium: 138 mmol/L (ref 135–145)
Total Bilirubin: 0.9 mg/dL (ref 0.3–1.2)
Total Protein: 6.3 g/dL — ABNORMAL LOW (ref 6.5–8.1)

## 2021-05-24 LAB — PREPARE RBC (CROSSMATCH)

## 2021-05-24 LAB — POTASSIUM: Potassium: 3.7 mmol/L (ref 3.5–5.1)

## 2021-05-24 LAB — GLUCOSE, CAPILLARY
Glucose-Capillary: 87 mg/dL (ref 70–99)
Glucose-Capillary: 112 mg/dL — ABNORMAL HIGH (ref 70–99)
Glucose-Capillary: 128 mg/dL — ABNORMAL HIGH (ref 70–99)
Glucose-Capillary: 85 mg/dL (ref 70–99)
Glucose-Capillary: 120 mg/dL — ABNORMAL HIGH (ref 70–99)
Glucose-Capillary: 149 mg/dL — ABNORMAL HIGH (ref 70–99)
Glucose-Capillary: 141 mg/dL — ABNORMAL HIGH (ref 70–99)
Glucose-Capillary: 92 mg/dL (ref 70–99)

## 2021-05-24 LAB — ABO/RH: ABO/RH(D): O POS

## 2021-05-24 LAB — PHOSPHORUS: Phosphorus: 2.6 mg/dL (ref 2.5–4.6)

## 2021-05-24 LAB — OCCULT BLOOD X 1 CARD TO LAB, STOOL: Fecal Occult Bld: POSITIVE — AB

## 2021-05-24 LAB — MAGNESIUM: Magnesium: 2.8 mg/dL — ABNORMAL HIGH (ref 1.7–2.4)

## 2021-05-24 SURGERY — LAPAROTOMY, EXPLORATORY
Anesthesia: General

## 2021-05-24 MED ORDER — ACETAMINOPHEN 10 MG/ML IV SOLN
1000.0000 mg | Freq: Four times a day (QID) | INTRAVENOUS | Status: AC
  Administered 2021-05-24 – 2021-05-25 (×3): 1000 mg via INTRAVENOUS
  Filled 2021-05-24 (×4): qty 100

## 2021-05-24 MED ORDER — CHLORHEXIDINE GLUCONATE 0.12 % MT SOLN: 15.0000 mL | Freq: Once | OROMUCOSAL | Status: AC

## 2021-05-24 MED ORDER — LACTATED RINGERS IV SOLN
INTRAVENOUS | Status: DC | PRN
Start: 2021-05-24 — End: 2021-05-24

## 2021-05-24 MED ORDER — LACTATED RINGERS IV SOLN: INTRAVENOUS | Status: DC

## 2021-05-24 MED ORDER — CEFAZOLIN SODIUM-DEXTROSE 2-3 GM-%(50ML) IV SOLR
INTRAVENOUS | Status: DC | PRN
  Administered 2021-05-24: 2 g via INTRAVENOUS

## 2021-05-24 MED ORDER — DEXAMETHASONE SODIUM PHOSPHATE 10 MG/ML IJ SOLN
INTRAMUSCULAR | Status: AC
  Filled 2021-05-24: qty 1

## 2021-05-24 MED ORDER — SUCCINYLCHOLINE CHLORIDE 200 MG/10ML IV SOSY
PREFILLED_SYRINGE | INTRAVENOUS | Status: AC
  Filled 2021-05-24: qty 10

## 2021-05-24 MED ORDER — CHLORHEXIDINE GLUCONATE 0.12 % MT SOLN
OROMUCOSAL | Status: AC
  Administered 2021-05-24: 15 mL via OROMUCOSAL
  Filled 2021-05-24: qty 15

## 2021-05-24 MED ORDER — 0.9 % SODIUM CHLORIDE (POUR BTL) OPTIME
TOPICAL | Status: DC | PRN
  Administered 2021-05-24: 2000 mL

## 2021-05-24 MED ORDER — MIDAZOLAM HCL 2 MG/2ML IJ SOLN
INTRAMUSCULAR | Status: AC
  Filled 2021-05-24: qty 2

## 2021-05-24 MED ORDER — HYDROMORPHONE HCL 1 MG/ML IJ SOLN
INTRAMUSCULAR | Status: AC
  Filled 2021-05-24: qty 1

## 2021-05-24 MED ORDER — SODIUM CHLORIDE 0.9 % IV SOLN: INTRAVENOUS | Status: AC

## 2021-05-24 MED ORDER — SUCCINYLCHOLINE CHLORIDE 200 MG/10ML IV SOSY
PREFILLED_SYRINGE | INTRAVENOUS | Status: DC | PRN
  Administered 2021-05-24: 100 mg via INTRAVENOUS

## 2021-05-24 MED ORDER — LIDOCAINE 2% (20 MG/ML) 5 ML SYRINGE
INTRAMUSCULAR | Status: AC
  Filled 2021-05-24: qty 5

## 2021-05-24 MED ORDER — DROPERIDOL 2.5 MG/ML IJ SOLN: 0.6250 mg | Freq: Once | INTRAMUSCULAR | Status: DC | PRN

## 2021-05-24 MED ORDER — ORAL CARE MOUTH RINSE: 15.0000 mL | Freq: Once | OROMUCOSAL | Status: AC

## 2021-05-24 MED ORDER — HYDROMORPHONE HCL 1 MG/ML IJ SOLN
INTRAMUSCULAR | Status: AC
Start: 2021-05-24 — End: 2021-05-25
  Filled 2021-05-24: qty 1

## 2021-05-24 MED ORDER — LIDOCAINE 2% (20 MG/ML) 5 ML SYRINGE
INTRAMUSCULAR | Status: DC | PRN
  Administered 2021-05-24: 80 mg via INTRAVENOUS

## 2021-05-24 MED ORDER — ROCURONIUM BROMIDE 100 MG/10ML IV SOLN
INTRAVENOUS | Status: DC | PRN
Start: 2021-05-24 — End: 2021-05-24
  Administered 2021-05-24: 50 mg via INTRAVENOUS

## 2021-05-24 MED ORDER — DEXAMETHASONE SODIUM PHOSPHATE 10 MG/ML IJ SOLN
INTRAMUSCULAR | Status: DC | PRN
  Administered 2021-05-24: 8 mg via INTRAVENOUS

## 2021-05-24 MED ORDER — HYDROMORPHONE HCL 1 MG/ML IJ SOLN
0.5000 mg | INTRAMUSCULAR | Status: AC | PRN
  Administered 2021-05-24 (×2): 1 mg via INTRAVENOUS
  Filled 2021-05-24: qty 1

## 2021-05-24 MED ORDER — ONDANSETRON HCL 4 MG/2ML IJ SOLN
INTRAMUSCULAR | Status: DC | PRN
  Administered 2021-05-24: 4 mg via INTRAVENOUS

## 2021-05-24 MED ORDER — MIDAZOLAM HCL 5 MG/5ML IJ SOLN
INTRAMUSCULAR | Status: DC | PRN
  Administered 2021-05-24 (×2): 1 mg via INTRAVENOUS

## 2021-05-24 MED ORDER — ROCURONIUM BROMIDE 10 MG/ML (PF) SYRINGE
PREFILLED_SYRINGE | INTRAVENOUS | Status: AC
  Filled 2021-05-24: qty 10

## 2021-05-24 MED ORDER — FENTANYL CITRATE (PF) 250 MCG/5ML IJ SOLN
INTRAMUSCULAR | Status: AC
  Filled 2021-05-24: qty 5

## 2021-05-24 MED ORDER — FENTANYL CITRATE (PF) 100 MCG/2ML IJ SOLN
INTRAMUSCULAR | Status: DC | PRN
  Administered 2021-05-24: 25 ug via INTRAVENOUS
  Administered 2021-05-24: 100 ug via INTRAVENOUS
  Administered 2021-05-24: 25 ug via INTRAVENOUS
  Administered 2021-05-24: 50 ug via INTRAVENOUS

## 2021-05-24 MED ORDER — CHLORHEXIDINE GLUCONATE CLOTH 2 % EX PADS
6.0000 | MEDICATED_PAD | Freq: Every day | CUTANEOUS | Status: DC
  Administered 2021-05-24 – 2021-06-02 (×9): 6 via TOPICAL

## 2021-05-24 MED ORDER — PROPOFOL 10 MG/ML IV BOLUS
INTRAVENOUS | Status: AC
  Filled 2021-05-24: qty 20

## 2021-05-24 MED ORDER — PROPOFOL 10 MG/ML IV BOLUS
INTRAVENOUS | Status: DC | PRN
  Administered 2021-05-24: 140 mg via INTRAVENOUS

## 2021-05-24 MED ORDER — ACETAMINOPHEN 10 MG/ML IV SOLN
INTRAVENOUS | Status: DC | PRN
  Administered 2021-05-24: 1000 mg via INTRAVENOUS

## 2021-05-24 MED ORDER — METHOCARBAMOL 1000 MG/10ML IJ SOLN
500.0000 mg | Freq: Four times a day (QID) | INTRAVENOUS | Status: DC | PRN
  Administered 2021-05-24 – 2021-05-25 (×2): 500 mg via INTRAVENOUS
  Filled 2021-05-24: qty 5
  Filled 2021-05-24: qty 500

## 2021-05-24 MED ORDER — POTASSIUM CHLORIDE 10 MEQ/100ML IV SOLN
10.0000 meq | INTRAVENOUS | Status: AC
  Administered 2021-05-24 (×5): 10 meq via INTRAVENOUS
  Filled 2021-05-24 (×5): qty 100

## 2021-05-24 MED ORDER — PHENYLEPHRINE 80 MCG/ML (10ML) SYRINGE FOR IV PUSH (FOR BLOOD PRESSURE SUPPORT)
PREFILLED_SYRINGE | INTRAVENOUS | Status: DC | PRN
  Administered 2021-05-24: 80 ug via INTRAVENOUS
  Administered 2021-05-24 (×3): 120 ug via INTRAVENOUS
  Administered 2021-05-24: 80 ug via INTRAVENOUS

## 2021-05-24 MED ORDER — SUGAMMADEX SODIUM 200 MG/2ML IV SOLN
INTRAVENOUS | Status: DC | PRN
  Administered 2021-05-24: 150 mg via INTRAVENOUS

## 2021-05-24 MED ORDER — ACETAMINOPHEN 10 MG/ML IV SOLN
INTRAVENOUS | Status: AC
  Filled 2021-05-24: qty 100

## 2021-05-24 MED ORDER — HEPARIN SODIUM (PORCINE) 5000 UNIT/ML IJ SOLN
5000.0000 [IU] | Freq: Three times a day (TID) | INTRAMUSCULAR | Status: DC
  Administered 2021-05-25 – 2021-06-03 (×28): 5000 [IU] via SUBCUTANEOUS
  Filled 2021-05-24 (×29): qty 1

## 2021-05-24 MED ORDER — HYDROMORPHONE HCL 1 MG/ML IJ SOLN
0.2500 mg | INTRAMUSCULAR | Status: DC | PRN
  Administered 2021-05-24 (×3): 0.5 mg via INTRAVENOUS

## 2021-05-24 MED ORDER — PHENYLEPHRINE 80 MCG/ML (10ML) SYRINGE FOR IV PUSH (FOR BLOOD PRESSURE SUPPORT)
PREFILLED_SYRINGE | INTRAVENOUS | Status: AC
  Filled 2021-05-24: qty 30

## 2021-05-24 SURGICAL SUPPLY — 48 items
BAG COUNTER SPONGE SURGICOUNT (BAG) ×2 IMPLANT
BLADE CLIPPER SURG (BLADE) IMPLANT
CANISTER SUCT 3000ML PPV (MISCELLANEOUS) ×2 IMPLANT
CHLORAPREP W/TINT 26 (MISCELLANEOUS) ×2 IMPLANT
COVER SURGICAL LIGHT HANDLE (MISCELLANEOUS) ×2 IMPLANT
DRAPE LAPAROSCOPIC ABDOMINAL (DRAPES) ×2 IMPLANT
DRAPE WARM FLUID 44X44 (DRAPES) ×2 IMPLANT
DRSG OPSITE POSTOP 4X10 (GAUZE/BANDAGES/DRESSINGS) ×1 IMPLANT
DRSG OPSITE POSTOP 4X8 (GAUZE/BANDAGES/DRESSINGS) IMPLANT
ELECT BLADE 6.5 EXT (BLADE) IMPLANT
ELECT CAUTERY BLADE 6.4 (BLADE) IMPLANT
ELECT REM PT RETURN 9FT ADLT (ELECTROSURGICAL) ×2
ELECTRODE REM PT RTRN 9FT ADLT (ELECTROSURGICAL) ×1 IMPLANT
GLOVE BIO SURGEON STRL SZ7 (GLOVE) ×2 IMPLANT
GLOVE BIOGEL PI IND STRL 7.5 (GLOVE) ×1 IMPLANT
GLOVE BIOGEL PI INDICATOR 7.5 (GLOVE) ×1
GOWN STRL REUS W/ TWL LRG LVL3 (GOWN DISPOSABLE) ×2 IMPLANT
GOWN STRL REUS W/TWL LRG LVL3 (GOWN DISPOSABLE) ×4
HANDLE SUCTION POOLE (INSTRUMENTS) ×1 IMPLANT
KIT BASIN OR (CUSTOM PROCEDURE TRAY) ×2 IMPLANT
KIT TURNOVER KIT B (KITS) ×2 IMPLANT
LIGASURE IMPACT 36 18CM CVD LR (INSTRUMENTS) ×1 IMPLANT
NS IRRIG 1000ML POUR BTL (IV SOLUTION) ×4 IMPLANT
PACK GENERAL/GYN (CUSTOM PROCEDURE TRAY) ×2 IMPLANT
PAD ARMBOARD 7.5X6 YLW CONV (MISCELLANEOUS) ×2 IMPLANT
PENCIL SMOKE EVACUATOR (MISCELLANEOUS) ×2 IMPLANT
RELOAD PROXIMATE 75MM BLUE (ENDOMECHANICALS) ×4 IMPLANT
RELOAD STAPLE 75 3.8 BLU REG (ENDOMECHANICALS) IMPLANT
SPECIMEN JAR LARGE (MISCELLANEOUS) IMPLANT
SPONGE T-LAP 18X18 ~~LOC~~+RFID (SPONGE) IMPLANT
STAPLER GUN LINEAR PROX 60 (STAPLE) ×1 IMPLANT
STAPLER PROXIMATE 75MM BLUE (STAPLE) ×1 IMPLANT
STAPLER VISISTAT 35W (STAPLE) ×2 IMPLANT
SUCTION POOLE HANDLE (INSTRUMENTS) ×4
SUT NOVA 1 T20/GS 25DT (SUTURE) ×1 IMPLANT
SUT PDS AB 1 TP1 96 (SUTURE) ×4 IMPLANT
SUT SILK 2 0 (SUTURE) ×2
SUT SILK 2 0 SH CR/8 (SUTURE) ×2 IMPLANT
SUT SILK 2-0 18XBRD TIE 12 (SUTURE) ×1 IMPLANT
SUT SILK 3 0 (SUTURE) ×2
SUT SILK 3 0 SH CR/8 (SUTURE) ×2 IMPLANT
SUT SILK 3-0 18XBRD TIE 12 (SUTURE) ×1 IMPLANT
SUT VIC AB 3-0 SH 27 (SUTURE)
SUT VIC AB 3-0 SH 27X BRD (SUTURE) IMPLANT
TOWEL GREEN STERILE (TOWEL DISPOSABLE) ×2 IMPLANT
TRAY FOLEY MTR SLVR 16FR STAT (SET/KITS/TRAYS/PACK) ×2 IMPLANT
TUBE CONNECTING 12X1/4 (SUCTIONS) ×1 IMPLANT
YANKAUER SUCT BULB TIP NO VENT (SUCTIONS) ×1 IMPLANT

## 2021-05-24 NOTE — Progress Notes (Signed)
[facility] ?Progress Note ? ?   ?Subjective: ?CC-  ?No better no worse. States that he had a loose stool over night after an enema. No flatus this morning. Xray shows persistent small bowel dilatation. ? ?Objective: ?Vital signs in last 24 hours: ?Temp:  [98 ?F (36.7 ?C)-98.3 ?F (36.8 ?C)] 98.3 ?F (36.8 ?C) (04/18 3419) ?Pulse Rate:  [81-95] 95 (04/18 0641) ?Resp:  [17] 17 (04/18 6222) ?BP: (116-121)/(61-71) 121/71 (04/18 9798) ?SpO2:  [98 %] 98 % (04/18 0641) ?Last BM Date : 05/23/21 ? ?Intake/Output from previous day: ?04/17 0701 - 04/18 0700 ?In: 2080.6 [P.O.:440; I.V.:1200; IV Piggyback:440.6] ?Out: 92119 [Urine:400; Emesis/NG output:10300] ?Intake/Output this shift: ?No intake/output data recorded. ? ?PE: ?Gen:  Alert, NAD, pleasant ?Abd: distended but soft, nontender  ? ?Lab Results:  ?Recent Labs  ?  05/23/21 ?4174 05/24/21 ?0351  ?WBC 5.6 3.5*  ?HGB 7.4* 7.4*  ?HCT 24.2* 25.1*  ?PLT 253 253  ? ?BMET ?Recent Labs  ?  05/23/21 ?0814 05/24/21 ?0351  ?NA 136 138  ?K 3.0* 2.9*  ?CL 91* 91*  ?CO2 31 35*  ?GLUCOSE 93 105*  ?BUN 57* 33*  ?CREATININE 1.92* 1.59*  ?CALCIUM 8.4* 8.2*  ? ?PT/INR ?No results for input(s): LABPROT, INR in the last 72 hours. ?CMP  ?   ?Component Value Date/Time  ? NA 138 05/24/2021 0351  ? K 2.9 (L) 05/24/2021 0351  ? CL 91 (L) 05/24/2021 0351  ? CO2 35 (H) 05/24/2021 0351  ? GLUCOSE 105 (H) 05/24/2021 0351  ? BUN 33 (H) 05/24/2021 0351  ? CREATININE 1.59 (H) 05/24/2021 0351  ? CALCIUM 8.2 (L) 05/24/2021 0351  ? PROT 6.3 (L) 05/24/2021 0351  ? ALBUMIN 3.2 (L) 05/24/2021 0351  ? AST 13 (L) 05/24/2021 0351  ? ALT 9 05/24/2021 0351  ? ALKPHOS 54 05/24/2021 0351  ? BILITOT 0.9 05/24/2021 0351  ? GFRNONAA 47 (L) 05/24/2021 0351  ? GFRAA >60 07/06/2015 0458  ? ?Lipase  ?   ?Component Value Date/Time  ? LIPASE 34 05/20/2021 0225  ? ? ? ? ? ?Studies/Results: ?DG Abd 2 Views ? ?Result Date: 05/22/2021 ?CLINICAL DATA:  Small-bowel obstruction.  Follow-up exam. EXAM: ABDOMEN - 2 VIEW  COMPARISON:  05/21/2021.  CT, 05/20/2021 FINDINGS: Multiple loops of dilated small bowel are similar to the previous day's exam consistent with a persistent partial small bowel obstruction. Air and stool is noted within a normal caliber colon, also stable. No free air. Nasogastric tube curls in the proximal stomach. IMPRESSION: 1. Persistent partial small bowel obstruction. Dilated small bowel is without significant change from the previous day's exam. Electronically Signed   By: Amie Portland M.D.   On: 05/22/2021 14:30  ? ?DG Abd Portable 1V-Small Bowel Obstruction Protocol-24 hr delay ? ?Result Date: 05/24/2021 ?CLINICAL DATA:  Encounter for small bowel obstruction. EXAM: PORTABLE ABDOMEN - 1 VIEW COMPARISON:  Yesterday's study at 9:57 p.m. FINDINGS: Flat plate single-view is obtained at 4:40 a.m., 05/24/2021. Upper to mid abdominal small bowel dilatation continues to measure up to 5.3 cm. NGT proximal side hole and tip are just below the EG junction and could be advanced further in for optimal placement. This is unchanged. Scattered colonic gas is seen at least as far as the distal descending colon. The visceral shadows are stable. There is no supine evidence of free air. Degenerative change lumbar spine, lower thoracic spine. IMPRESSION: NGT tip remains in the proximal stomach and could be advanced further in for optimal placement. There is  no improvement or worsening in small bowel dilatation. Electronically Signed   By: Almira Bar M.D.   On: 05/24/2021 06:49  ? ?DG Abd Portable 1V-Small Bowel Obstruction Protocol-initial, 8 hr delay ? ?Result Date: 05/23/2021 ?CLINICAL DATA:  Small bowel obstruction. EXAM: PORTABLE ABDOMEN - 1 VIEW COMPARISON:  Abdominal x-ray 05/22/2021. FINDINGS: Dilated small bowel loops appear unchanged measuring up to 5.3 cm. Nasogastric tube tip is in the proximal stomach just beyond the gastroesophageal junction similar to the prior exam. IMPRESSION: 1. Nasogastric tube tip in the  proximal stomach, unchanged. 2. Stable dilated small bowel compatible with small-bowel obstruction. Electronically Signed   By: Darliss Cheney M.D.   On: 05/23/2021 22:06   ? ?Anti-infectives: ?Anti-infectives (From admission, onward)  ? ? None  ? ?  ? ? ? ?Assessment/Plan ?SBO, fecal impaction ?- CT 4/14 showed small bowel obstruction with a transition zone suspected within the medial aspect of the mid right abdomen; large amount of stool throughout the large bowel, with impacted stool noted within the sigmoid colon and rectum ?- NG with high output, patient not passing any flatus, and he reports Bms only with enemas. Xray without improvement. He does not seem to be getting better with conservative measures. Recommend surgery today, patient agrees. Plan for exploratory laparotomy and possible bowel resection. K is 2.9, will replete this. Hgb 7.4, will type and screen prior to OR. ?  ?ID - none ?FEN - IVF, NPO/NGT to LIWS ?VTE - sq heparin ?Foley - none ?  ?ABL anemia - hgb slowly drifting down, 7.4 this AM. FOBT+. Will Type and screen prior to OR ?AKI ?DM ?HLD ?  ?I reviewed hospitalist notes, last 24 h vitals and pain scores, last 48 h intake and output, last 24 h labs and trends (CBC, CMP, magnesium, phosphorous), and last 24 h imaging results (xray from this morning) ? ? ? LOS: 4 days  ? ? ?Franne Forts, PA-C ?[facility] ?05/24/2021, 9:13 AM ?Please see Amion for pager number during day hours 7:00am-4:30pm ? ?

## 2021-05-24 NOTE — Progress Notes (Addendum)
?PROGRESS NOTE ? ? ? ?Sallyanne Haversllis D Bolyard  ZOX:[id number] DOB: [birth year] DOA: 05/19/2021 ?PCP: Patient, No Pcp Per (Inactive)  ? ? ? ?Brief Narrative:  ? 66 y.o. BM PMHx DM Type II controlled, HLD, chronic microcytic anemia, Hx of SBO requiring lysis of adhesions  ? ?Pesented to the ED for evaluation of nausea and vomiting. ? ?Patient reports new onset of frequent nausea and vomiting beginning on 4/12.  He says emesis has been frequent up to 12 times per day with green appearance.  He has not had any blood in his emesis.  He states his last bowel movement was 2 days prior to admission.  He has been passing gas.  He says he only has abdominal pain during his emesis episodes.  He has not been eating or drinking since onset of symptoms.  He says his last meal prior to symptom onset was a bacon, egg, and cheese sandwich from a gas station. ? ?Patient does have a history of SBO in 2017 requiring exploratory laparotomy with lysis of adhesions.  He says his symptoms were more severe at that time. ?  ?ED Course  Labs/Imaging on admission: I have personally reviewed following labs and imaging studies. ?  ?Initial vitals showed BP 128/90, pulse 117, RR 16, temp 98.4 ?F, SPO2 99% on room air. ?  ?Labs show BUN 26, creatinine 2.05 (previously 1.10 01/22/2018), sodium 136, potassium 3.5, bicarb 26, LFTs within normal limits, serum glucose 191, WBC 10.6, hemoglobin 8.9, platelets 369,000, magnesium 2.3. ?  ?Patient was given 1 L LR.  The hospitalist service was consulted to admit for further evaluation and management. ? ? ?Subjective: ?4/18 afebrile overnight A/O x4.  Patient states abdomen currently sore, but not painful.  Feels much better postsurgery. ? ? ?Assessment & Plan: ?Covid vaccination; ?  ?Principal Problem: ?  Small bowel obstruction (HCC) ?Active Problems: ?  Hypokalemia ?  Acute kidney injury (HCC) ?  Type 2 diabetes mellitus (HCC) ?  Microcytic anemia ?  Hyperlipidemia associated with type 2 diabetes mellitus  (HCC) ? ?Small bowel obstruction (HCC) ?-Presented with nausea, vomiting, abdominal pain, H/o SBO ? requiring lysis of adhesions.  ?-KUB -high-grade SBO,  Moderate amount of stool in the colon and large amount of stool in the rectum also noted. ?-Keep n.p.o. ?-Place NG tube  --was clamped, reinitiating wall suction for decompression ?-Per surgery continuing enema, manual disimpaction ?Encouraging ambulation ?-General surgery following closely-hoping to avoid surgical intervention ?-Lactated Ringer's 15025ml/hr ? -Antiemetics and analgesics as needed ?-Discussed with general surgery, Dr. Dossie DerStechschulte, on admission ?-4/16 per [facility] continue n.p.o. with ice chips, NG decompression, OOB and ambulation. ?-4/17 SBO protocol per surgery.  If it is unsuccessful patient will require surgery to resolve SBO ?-4/18 s/p laparotomy with SBO resection see below ?  ?Hypokalemia ?-Potassium goal> 4 ?- 4/18 Potassium IV 50 mEq ?-4/18 Normal Saline 16300ml/hr ?--Repeat K= 3.7.  Will not add additional potassium at this time. ?  ?Acute kidney injury (last Cr 1.10 on 01/2018) * ?Creatinine 2.05 on admission,  ?-Likely related to GI losses and poor oral intake in setting of SBO. ?Lab Results  ?Component Value Date  ? CREATININE 1.59 (H) 05/24/2021  ? CREATININE 1.92 (H) 05/23/2021  ? CREATININE 2.32 (H) 05/22/2021  ? CREATININE 2.61 (H) 05/22/2021  ? CREATININE 2.89 (H) 05/21/2021  ?-Strict ins and out -3.5 L ?- Daily weight ?Filed Weights  ? 05/20/21 0745 05/20/21 1643 05/20/21 1949  ?Weight: 93 kg 70.8 kg 72.3 kg  ?- Fluid intake  see SBO ?-Hold metformin and avoid NSAIDs ?-4/18 continued improvement in renal function ?  ?Type 2 diabetes mellitus (HCC) ?-Holding metformin ?-Sensitive SSI, ?  ?Microcytic anemia ?Patient with chronic microcytic anemia with hemoglobin 8.9, at baseline.  No obvious bleeding. ?-Iron studies total iron 25 low, TIBC 459, ferritin 7, ?Globin 8.8, ?Folate, B12 within normal limits ?Lab Results  ?Component Value Date   ? HGB 7.4 (L) 05/24/2021  ? HGB 7.4 (L) 05/23/2021  ? HGB 7.9 (L) 05/22/2021  ? HGB 8.8 (L) 05/20/2021  ? HGB 8.9 (L) 05/19/2021  ?-4/17 occult blood pending ?  ?Hyperlipidemia associated with type 2 diabetes mellitus (HCC) ?Hold statin while NPO. ?  ? ? ?  ? ? ?Mobility Assessment (last 72 hours)   ? ? Mobility Assessment   ? ? Row Name 05/24/21 0900 05/23/21 2200 05/23/21 0900 05/22/21 2300 05/22/21 0900  ? Does patient have an order for bedrest or is patient medically unstable No - Continue assessment No - Continue assessment No - Continue assessment No - Continue assessment No - Continue assessment  ? What is the highest level of mobility based on the progressive mobility assessment? Level 4 (Walks with assist in room) - Balance while marching in place and cannot step forward and back - Complete -- Level 4 (Walks with assist in room) - Balance while marching in place and cannot step forward and back - Complete -- Level 4 (Walks with assist in room) - Balance while marching in place and cannot step forward and back - Complete  ? ? Row Name 05/21/21 2200  ?  ?  ?  ?  ? Does patient have an order for bedrest or is patient medically unstable No - Continue assessment      ? ?  ?  ? ?  ? ? ?DVT prophylaxis: SCD ?Code Status: Full ?Family Communication: 4/18 wife at bedside for discussion of plan of care all questions answered ?Status is: Inpatient ? ? ? ?Dispo: The patient is from: Home ?             Anticipated d/c is to: Home ?             Anticipated d/c date is: > 3 days ?             Patient currently medically unstable ? ? ? ? ? ?Consultants:  ?CCS ? ?Procedures/Significant Events:  ?4/18 s/p Laparotomy with lysis of adhesions,; Small bowel resection ? ? ? ?I have personally reviewed and interpreted all radiology studies and my findings are as above. ? ?VENTILATOR SETTINGS: ? ? ? ?Cultures ? ? ?Antimicrobials: ?Anti-infectives (From admission, onward)  ? ? None  ? ?  ?  ? ? ?Devices ?  ? ?LINES / TUBES:   ? ? ? ? ?Continuous Infusions: ? acetaminophen    ? lactated ringers with kcl 125 mL/hr at 05/24/21 1455  ? methocarbamol (ROBAXIN) IV    ? potassium chloride 10 mEq (05/24/21 1423)  ? ? ? ?Objective: ?Vitals:  ? 05/24/21 1345 05/24/21 1400 05/24/21 1415 05/24/21 1441  ?BP: (!) 143/71 (!) 119/45 (!) 152/73 (!) 140/43  ?Pulse: 84 86 95 96  ?Resp: 20 19 18 18   ?Temp: 98 ?F (36.7 ?C)  98 ?F (36.7 ?C) 98.3 ?F (36.8 ?C)  ?TempSrc:      ?SpO2: 94% 93% 97% 100%  ?Weight:      ?Height:      ? ? ?Intake/Output Summary (Last 24 hours) at 05/24/2021 1459 ?Last  data filed at 05/24/2021 1417 ?Gross per 24 hour  ?Intake 2670.55 ml  ?Output 9610 ml  ?Net -6939.45 ml  ? ? ?Filed Weights  ? 05/20/21 0745 05/20/21 1643 05/20/21 1949  ?Weight: 93 kg 70.8 kg 72.3 kg  ? ? ?Examination: ? ?General: A/O x4 No acute respiratory distress, cachectic ?Eyes: negative scleral hemorrhage, negative anisocoria, negative icterus ?ENT: Negative Runny nose, negative gingival bleeding, ?Neck:  Negative scars, masses, torticollis, lymphadenopathy, JVD ?Lungs: Clear to auscultation bilaterally without wheezes or crackles ?Cardiovascular: Regular rate and rhythm without murmur gallop or rub normal S1 and S2 ?Abdomen: Appropriate abdominal pain, nondistended, positive soft, bowel sounds, no rebound, no ascites, no appreciable mass ?Extremities: No significant cyanosis, clubbing, or edema bilateral lower extremities ?Skin: Negative rashes, lesions, ulcers ?Psychiatric:  Negative depression, negative anxiety, negative fatigue, negative mania  ?Central nervous system:  Cranial nerves II through XII intact, tongue/uvula midline, all extremities muscle strength 5/5, sensation intact throughout, negative dysarthria, negative expressive aphasia, negative receptive aphasia. ? ?.  ? ? ? ?Data Reviewed: Care during the described time interval was provided by me .  I have reviewed this patient's available data, including medical history, events of note, physical  examination, and all test results as part of my evaluation. ? ?CBC: ?Recent Labs  ?Lab 05/19/21 ?0821 05/20/21 ?0225 05/22/21 ?0959 05/23/21 ?9030 05/24/21 ?0351  ?WBC 10.6* 7.0 5.4 5.6 3.5*  ?NEUTROABS 8.1*  --  3.9 4.1 2

## 2021-05-24 NOTE — Anesthesia Preprocedure Evaluation (Addendum)
Anesthesia Evaluation  ?Patient identified by MRN, date of birth, ID band ?Patient awake ? ? ? ?Reviewed: ?Allergy & Precautions, NPO status , Patient's Chart, lab work & pertinent test results ? ?History of Anesthesia Complications ?Negative for: history of anesthetic complications ? ?Airway ?Mallampati: II ? ?TM Distance: >3 FB ?Neck ROM: Full ? ? ? Dental ? ?(+) Edentulous Lower, Edentulous Upper ?  ?Pulmonary ?neg pulmonary ROS,  ?  ?Pulmonary exam normal ? ? ? ? ? ? ? Cardiovascular ?negative cardio ROS ?Normal cardiovascular exam ? ? ?  ?Neuro/Psych ?negative neurological ROS ? negative psych ROS  ? GI/Hepatic ?Neg liver ROS, GERD  Controlled and Medicated,small bowel obstruction ?  ?Endo/Other  ?diabetes, Type 2, Oral Hypoglycemic AgentsK 2.9 ? Renal/GU ?ARFRenal disease  ?negative genitourinary ?  ?Musculoskeletal ?negative musculoskeletal ROS ?(+)  ? Abdominal ?  ?Peds ? Hematology ? ?(+) Blood dyscrasia, anemia , Hgb 7.4   ?Anesthesia Other Findings ?Day of surgery medications reviewed with patient. ? Reproductive/Obstetrics ?negative OB ROS ? ?  ? ? ? ? ? ? ? ? ? ? ? ? ? ?  ?  ? ? ? ? ? ? ? ?Anesthesia Physical ?Anesthesia Plan ? ?ASA: 2 ? ?Anesthesia Plan: General  ? ?Post-op Pain Management: Ofirmev IV (intra-op)*  ? ?Induction: Intravenous and Rapid sequence ? ?PONV Risk Score and Plan: 3 and Midazolam, Treatment may vary due to age or medical condition, Dexamethasone and Ondansetron ? ?Airway Management Planned: Oral ETT ? ?Additional Equipment: None ? ?Intra-op Plan:  ? ?Post-operative Plan: Extubation in OR ? ?Informed Consent: I have reviewed the patients History and Physical, chart, labs and discussed the procedure including the risks, benefits and alternatives for the proposed anesthesia with the patient or authorized representative who has indicated his/her understanding and acceptance.  ? ? ? ?Dental advisory given ? ?Plan Discussed with: CRNA ? ?Anesthesia  Plan Comments:   ? ? ? ? ? ?Anesthesia Quick Evaluation ? ?

## 2021-05-24 NOTE — Anesthesia Postprocedure Evaluation (Signed)
Anesthesia Post Note ? ?Patient: Justin Yang ? ?Procedure(s) Performed: EXPLORATORY LAPAROTOMY , POSSIBLE BOWEL RESECTION ? ?  ? ?Patient location during evaluation: PACU ?Anesthesia Type: General ?Level of consciousness: awake and alert ?Pain management: pain level controlled ?Vital Signs Assessment: post-procedure vital signs reviewed and stable ?Respiratory status: spontaneous breathing, nonlabored ventilation and respiratory function stable ?Cardiovascular status: blood pressure returned to baseline ?Postop Assessment: no apparent nausea or vomiting ?Anesthetic complications: no ? ? ?No notable events documented. ? ?Last Vitals:  ?Vitals:  ? 05/24/21 1415 05/24/21 1441  ?BP: (!) 152/73 (!) 140/43  ?Pulse: 95 96  ?Resp: 18 18  ?Temp: 36.7 ?C 36.8 ?C  ?SpO2: 97% 100%  ?  ?Last Pain:  ?Vitals:  ? 05/24/21 1520  ?TempSrc:   ?PainSc: Asleep  ? ? ?  ?  ?  ?  ?  ?  ? ?Shanda Howells ? ? ? ? ?

## 2021-05-24 NOTE — Anesthesia Procedure Notes (Signed)
Procedure Name: Intubation ?Date/Time: 05/24/2021 12:26 PM ?Performed by: Marny Lowenstein, CRNA ?Pre-anesthesia Checklist: Patient identified, Emergency Drugs available, Suction available and Patient being monitored ?Patient Re-evaluated:Patient Re-evaluated prior to induction ?Oxygen Delivery Method: Circle system utilized ?Preoxygenation: Pre-oxygenation with 100% oxygen ?Induction Type: IV induction and Rapid sequence ?Laryngoscope Size: Hyacinth Meeker and 2 ?Grade View: Grade I ?Tube type: Oral ?Tube size: 7.5 mm ?Number of attempts: 1 ?Airway Equipment and Method: Stylet ?Placement Confirmation: ETT inserted through vocal cords under direct vision, positive ETCO2 and breath sounds checked- equal and bilateral ?Secured at: 22 cm ?Tube secured with: Tape ?Dental Injury: Teeth and Oropharynx as per pre-operative assessment  ? ? ? ? ?

## 2021-05-24 NOTE — Plan of Care (Signed)
?  Problem: Clinical Measurements: ?Goal: Respiratory complications will improve ?Outcome: Progressing ?  ?Problem: Clinical Measurements: ?Goal: Cardiovascular complication will be avoided ?Outcome: Progressing ?  ?Problem: Coping: ?Goal: Level of anxiety will decrease ?Outcome: Progressing ?  ?Problem: Elimination: ?Goal: Will not experience complications related to bowel motility ?Outcome: Progressing ?  ?

## 2021-05-24 NOTE — Op Note (Signed)
Preoperative diagnosis: SBO failed to resolve conservatively ?Postoperative diagnosis: adhesive sbo ?Procedure: ?1.  Laparotomy with lysis of adhesions ?2.  Small bowel resection ?Surgeon: Dr. Serita Grammes ?Asst; Margie Billet, PA-C ?Anesthesia: General ?Estimated blood loss: Minimal ?Specimens: Small bowel ?Complications: None ?Drains: None ?Sponge needle count was correct completion ?Decision recovery stable condition ? ?Indications: 62 yom with sbo likely adhesive and prior laparotomy who has failed to improve conservatively.  I discussed going to OR for elap and likely loa.   ? ?Procedure: After informed consent was obtained the patient was taken to the operating.  He was given antibiotics.  SCDs were in place.  He was placed under general anesthesia without complication.  He was prepped and draped in the standard sterile surgical fashion.  A surgical timeout was then performed. ? ?I made a midline incision and entered the abdomen.  I lysed adhesions from the omentum to safely enter.  The small bowel was very dilated and thin.  There was bloody ascites as well. I was able to track the dilated bowel to the right mid abdomen.  There was omentum that was entrapping a segment of small bowel and I released this. I used scissors to lyse these adhesions and freed the area.  There were other bowel to bowel adhesions when I ran the bowel but I did not release all of these. I ran the bowel from TI to LoT several times. It was all viable except at the point of the band.  This was necrotic but had not perforated. I made the decision to resect this segment.   I then used a GIA stapler to divide the bowel several centimeters on either side.    And oversewed this with silk sutures.  This was passed off the table as a specimen. I brought the proximal and distal end into apposition with 3-0 silk sutures.  I then made enterotomies in both.  I evacuated a lot of the contents proximally as it was dilated.  I then used a GIA  stapler to create a common enterotomy and create the anastomosis.  This was patent and hemostatic.  I closed the common hole with a TX stapler.  I placed two 3-0 silk stitches at the apex.  The mesenteric defect was closed with silk suture.  I then replaced the bowel into the abdomen.  I closed this with #1 looped PDS and intermittent #1 novafil sutures. Jodell Cipro were used to close the incisions.  A dressing was placed.  He tolerated this well was extubated and transferred to recovery stable. ?  ?

## 2021-05-24 NOTE — Transfer of Care (Signed)
Immediate Anesthesia Transfer of Care Note ? ?Patient: Justin Yang ? ?Procedure(s) Performed: EXPLORATORY LAPAROTOMY , POSSIBLE BOWEL RESECTION ? ?Patient Location: PACU ? ?Anesthesia Type:General ? ?Level of Consciousness: drowsy and patient cooperative ? ?Airway & Oxygen Therapy: Patient Spontanous Breathing ? ?Post-op Assessment: Report given to RN and Post -op Vital signs reviewed and stable ? ?Post vital signs: Reviewed and stable ? ?Last Vitals:  ?Vitals Value Taken Time  ?BP 143/71 05/24/21 1342  ?Temp    ?Pulse 34 05/24/21 1351  ?Resp 24 05/24/21 1351  ?SpO2 75 % 05/24/21 1351  ?Vitals shown include unvalidated device data. ? ?Last Pain:  ?Vitals:  ? 05/24/21 1134  ?TempSrc: Oral  ?PainSc: 0-No pain  ?   ? ?  ? ?Complications: No notable events documented. ?

## 2021-05-25 ENCOUNTER — Encounter (HOSPITAL_COMMUNITY): Payer: Self-pay | Admitting: General Surgery

## 2021-05-25 DIAGNOSIS — K56609 Unspecified intestinal obstruction, unspecified as to partial versus complete obstruction: Secondary | ICD-10-CM | POA: Diagnosis not present

## 2021-05-25 LAB — COMPREHENSIVE METABOLIC PANEL
ALT: 11 U/L (ref 0–44)
AST: 17 U/L (ref 15–41)
Albumin: 2.7 g/dL — ABNORMAL LOW (ref 3.5–5.0)
Alkaline Phosphatase: 44 U/L (ref 38–126)
Anion gap: 10 (ref 5–15)
BUN: 29 mg/dL — ABNORMAL HIGH (ref 8–23)
CO2: 31 mmol/L (ref 22–32)
Calcium: 7.4 mg/dL — ABNORMAL LOW (ref 8.9–10.3)
Chloride: 97 mmol/L — ABNORMAL LOW (ref 98–111)
Creatinine, Ser: 1.65 mg/dL — ABNORMAL HIGH (ref 0.61–1.24)
GFR, Estimated: 45 mL/min — ABNORMAL LOW (ref >60)
Glucose, Bld: 135 mg/dL — ABNORMAL HIGH (ref 70–99)
Potassium: 4 mmol/L (ref 3.5–5.1)
Sodium: 138 mmol/L (ref 135–145)
Total Bilirubin: 1 mg/dL (ref 0.3–1.2)
Total Protein: 5.6 g/dL — ABNORMAL LOW (ref 6.5–8.1)

## 2021-05-25 LAB — CBC WITH DIFFERENTIAL/PLATELET
Abs Immature Granulocytes: 0.1 10*3/uL — ABNORMAL HIGH (ref 0.00–0.07)
Basophils Absolute: 0 10*3/uL (ref 0.0–0.1)
Basophils Relative: 0 %
Eosinophils Absolute: 0 10*3/uL (ref 0.0–0.5)
Eosinophils Relative: 0 %
HCT: 27.6 % — ABNORMAL LOW (ref 39.0–52.0)
Hemoglobin: 8.4 g/dL — ABNORMAL LOW (ref 13.0–17.0)
Immature Granulocytes: 1 %
Lymphocytes Relative: 3 %
Lymphs Abs: 0.4 10*3/uL — ABNORMAL LOW (ref 0.7–4.0)
MCH: 23 pg — ABNORMAL LOW (ref 26.0–34.0)
MCHC: 30.4 g/dL (ref 30.0–36.0)
MCV: 75.6 fL — ABNORMAL LOW (ref 80.0–100.0)
Monocytes Absolute: 0.8 10*3/uL (ref 0.1–1.0)
Monocytes Relative: 7 %
Neutro Abs: 9.6 10*3/uL — ABNORMAL HIGH (ref 1.7–7.7)
Neutrophils Relative %: 89 %
Platelets: 203 10*3/uL (ref 150–400)
RBC: 3.65 MIL/uL — ABNORMAL LOW (ref 4.22–5.81)
RDW: 18.1 % — ABNORMAL HIGH (ref 11.5–15.5)
WBC Morphology: INCREASED
WBC: 10.9 10*3/uL — ABNORMAL HIGH (ref 4.0–10.5)
nRBC: 0 % (ref 0.0–0.2)

## 2021-05-25 LAB — PHOSPHORUS: Phosphorus: 3.8 mg/dL (ref 2.5–4.6)

## 2021-05-25 LAB — GLUCOSE, CAPILLARY
Glucose-Capillary: 103 mg/dL — ABNORMAL HIGH (ref 70–99)
Glucose-Capillary: 117 mg/dL — ABNORMAL HIGH (ref 70–99)
Glucose-Capillary: 120 mg/dL — ABNORMAL HIGH (ref 70–99)
Glucose-Capillary: 127 mg/dL — ABNORMAL HIGH (ref 70–99)
Glucose-Capillary: 141 mg/dL — ABNORMAL HIGH (ref 70–99)

## 2021-05-25 LAB — SURGICAL PATHOLOGY

## 2021-05-25 LAB — MAGNESIUM: Magnesium: 2.3 mg/dL (ref 1.7–2.4)

## 2021-05-25 MED ORDER — SODIUM CHLORIDE 0.9% FLUSH
10.0000 mL | INTRAVENOUS | Status: DC | PRN
  Administered 2021-05-31: 10 mL

## 2021-05-25 MED ORDER — SODIUM CHLORIDE 0.9 % IV SOLN: INTRAVENOUS | Status: AC

## 2021-05-25 MED ORDER — METHOCARBAMOL 1000 MG/10ML IJ SOLN
500.0000 mg | Freq: Four times a day (QID) | INTRAVENOUS | Status: DC
  Administered 2021-05-25 – 2021-05-26 (×3): 500 mg via INTRAVENOUS
  Filled 2021-05-25 (×3): qty 5

## 2021-05-25 MED ORDER — TRAVASOL 10 % IV SOLN
INTRAVENOUS | Status: AC
  Filled 2021-05-25: qty 550.8

## 2021-05-25 MED ORDER — HYDROMORPHONE HCL 1 MG/ML IJ SOLN
0.5000 mg | INTRAMUSCULAR | Status: DC | PRN
  Administered 2021-05-25: 1 mg via INTRAVENOUS
  Administered 2021-05-25: 0.5 mg via INTRAVENOUS
  Administered 2021-05-25: 1 mg via INTRAVENOUS
  Administered 2021-05-26: 0.5 mg via INTRAVENOUS
  Administered 2021-05-26 – 2021-05-30 (×10): 1 mg via INTRAVENOUS
  Filled 2021-05-25 (×15): qty 1

## 2021-05-25 MED ORDER — SODIUM CHLORIDE 0.9% FLUSH
10.0000 mL | Freq: Two times a day (BID) | INTRAVENOUS | Status: DC
  Administered 2021-05-25 – 2021-06-02 (×10): 10 mL

## 2021-05-25 MED ORDER — INSULIN ASPART 100 UNIT/ML IJ SOLN
0.0000 [IU] | Freq: Four times a day (QID) | INTRAMUSCULAR | Status: DC
  Administered 2021-05-25 – 2021-05-26 (×5): 1 [IU] via SUBCUTANEOUS
  Administered 2021-05-27: 3 [IU] via SUBCUTANEOUS
  Administered 2021-05-27: 2 [IU] via SUBCUTANEOUS
  Administered 2021-05-27: 1 [IU] via SUBCUTANEOUS
  Administered 2021-05-27: 2 [IU] via SUBCUTANEOUS
  Administered 2021-05-28 (×2): 3 [IU] via SUBCUTANEOUS

## 2021-05-25 NOTE — Evaluation (Signed)
Physical Therapy Evaluation & Discharge ?Patient Details ?Name: Justin Yang ?MRN: [id number] ?DOB: [birth year] ?Today's Date: 05/25/2021 ? ?History of Present Illness ? 66 y/o male presented to ED on 05/19/21 for abdominal cramping and N/V. CT abdomen showed small bowel obstruction. S/p laparotomy with small bowel resection on 4/18. PMH: T2DM, hx of SBO, chronic microcytic anemia.  ?Clinical Impression ? Patient admitted with the above. Patient functioning at modI level for mobility with no AD. Patient states he is at his baseline just with some increased pain post surgery. Educated patient on log roll technique for bed mobility to avoid pressure on abdominal incision, patient demonstrated understanding. Encouraged patient to continue mobilizing 2-3x/day to maintain strength and activity tolerance. No further skilled PT needs identified acutely. No PT follow up recommended at this time.    ?   ? ?Recommendations for follow up therapy are one component of a multi-disciplinary discharge planning process, led by the attending physician.  Recommendations may be updated based on patient status, additional functional criteria and insurance authorization. ? ?Follow Up Recommendations No PT follow up ? ?  ?Assistance Recommended at Discharge Intermittent Supervision/Assistance  ?Patient can return home with the following ?   ? ?  ?Equipment Recommendations None recommended by PT  ?Recommendations for Other Services ?    ?  ?Functional Status Assessment Patient has had a recent decline in their functional status and demonstrates the ability to make significant improvements in function in a reasonable and predictable amount of time.  ? ?  ?Precautions / Restrictions Precautions ?Precautions: Fall ?Precaution Comments: abdominal incision, NG tube ?Restrictions ?Weight Bearing Restrictions: No  ? ?  ? ?Mobility ? Bed Mobility ?Overal bed mobility: Modified Independent ?  ?  ?  ?  ?  ?  ?General bed mobility comments: good  performance of log roll technique after education ?  ? ?Transfers ?Overall transfer level: Modified independent ?Equipment used: None ?  ?  ?  ?  ?  ?  ?  ?  ?  ? ?Ambulation/Gait ?Ambulation/Gait assistance: Modified independent (Device/Increase time) ?Gait Distance (Feet): 250 Feet ?Assistive device: None ?Gait Pattern/deviations: WFL(Within Functional Limits) ?Gait velocity: decreased ?  ?  ?General Gait Details: therapist assist for management of lines initially but overall modI ? ?Stairs ?  ?  ?  ?  ?  ? ?Wheelchair Mobility ?  ? ?Modified Rankin (Stroke Patients Only) ?  ? ?  ? ?Balance Overall balance assessment: Mild deficits observed, not formally tested ?  ?  ?  ?  ?  ?  ?  ?  ?  ?  ?  ?  ?  ?  ?  ?  ?  ?  ?   ? ? ? ?Pertinent Vitals/Pain Pain Assessment ?Pain Assessment: Faces ?Faces Pain Scale: Hurts little more ?Pain Location: abdominal incision ?Pain Descriptors / Indicators: Grimacing ?Pain Intervention(s): Monitored during session  ? ? ?Home Living Family/patient expects to be discharged to:: Private residence ?Living Arrangements: Spouse/significant other ?Available Help at Discharge: Family ?Type of Home: House ?Home Access: Level entry ?  ?  ?  ?Home Layout: One level ?Home Equipment: None ?   ?  ?Prior Function Prior Level of Function : Independent/Modified Independent;Working/employed;Driving ?  ?  ?  ?  ?  ?  ?  ?  ?  ? ? ?Hand Dominance  ?   ? ?  ?Extremity/Trunk Assessment  ? Upper Extremity Assessment ?Upper Extremity Assessment: Overall WFL for tasks assessed ?  ? ?  Lower Extremity Assessment ?Lower Extremity Assessment: Overall WFL for tasks assessed ?  ? ?Cervical / Trunk Assessment ?Cervical / Trunk Assessment: Other exceptions ?Cervical / Trunk Exceptions: abdominal incision  ?Communication  ? Communication: No difficulties  ?Cognition Arousal/Alertness: Awake/alert ?Behavior During Therapy: [facility] for tasks assessed/performed ?Overall Cognitive Status: Within Functional Limits for tasks  assessed ?  ?  ?  ?  ?  ?  ?  ?  ?  ?  ?  ?  ?  ?  ?  ?  ?  ?  ?  ? ?  ?General Comments General comments (skin integrity, edema, etc.): VSS on RA ? ?  ?Exercises    ? ?Assessment/Plan  ?  ?PT Assessment Patient does not need any further PT services  ?PT Problem List   ? ?   ?  ?PT Treatment Interventions     ? ?PT Goals (Current goals can be found in the Care Plan section)  ?Acute Rehab PT Goals ?Patient Stated Goal: to go home ?PT Goal Formulation: All assessment and education complete, DC therapy ? ?  ?Frequency   ?  ? ? ?Co-evaluation   ?  ?  ?  ?  ? ? ?  ?AM-PAC PT "6 Clicks" Mobility  ?Outcome Measure Help needed turning from your back to your side while in a flat bed without using bedrails?: None ?Help needed moving from lying on your back to sitting on the side of a flat bed without using bedrails?: None ?Help needed moving to and from a bed to a chair (including a wheelchair)?: None ?Help needed standing up from a chair using your arms (e.g., wheelchair or bedside chair)?: None ?Help needed to walk in hospital room?: None ?Help needed climbing 3-5 steps with a railing? : None ?6 Click Score: 24 ? ?  ?End of Session   ?Activity Tolerance: Patient tolerated treatment well ?Patient left: in bed;with call bell/phone within reach;with family/visitor present ?Nurse Communication: Mobility status ?PT Visit Diagnosis: Muscle weakness (generalized) (M62.81) ?  ? ?Time: [id number] ?PT Time Calculation (min) (ACUTE ONLY): 27 min ? ? ?Charges:   PT Evaluation ?$PT Eval Low Complexity: 1 Low ?PT Treatments ?$Therapeutic Activity: 8-22 mins ?  ?   ? ? ?Lashawnna Lambrecht A. Gilford Rile, PT, DPT ?Acute Rehabilitation Services ?Pager [phone] ?Office [phone] ? ? ?Anayia Eugene A Diem Pagnotta ?05/25/2021, 11:41 AM ? ?

## 2021-05-25 NOTE — Progress Notes (Signed)
?PROGRESS NOTE ? ? ? ?Justin Yang  HRC:[id number] DOB: 02-07-1955 DOA: 05/19/2021 ?PCP: Patient, No Pcp Per (Inactive)  ? ?Brief Narrative: 66 year old male with small bowel obstruction.  He has a history of type 2 diabetes hyperlipidemia and a history of small bowel obstruction requiring lysis of adhesions in 2017. ? ?Assessment & Plan: ?  ?Principal Problem: ?  Small bowel obstruction (HCC) ?Active Problems: ?  Hypokalemia ?  Acute kidney injury (HCC) ?  Type 2 diabetes mellitus (HCC) ?  Microcytic anemia ?  Hyperlipidemia associated with type 2 diabetes mellitus (HCC) ? ?#1 small bowel obstruction-status post laparotomy with lysis of adhesions and small bowel resection. ?Patient has had no bowel movements or flatus. ?Surgery following and recommends to continue NG tube and start TPN. ? ?#2 AKI creatinine 1.65 continue IV fluids ? ?#3 type 2 diabetes continue SSI hold metformin ? ?#4 hyperlipidemia holding statin while n.p.o. ? ?#5 hypokalemia repleted ? ? ?  ? ? ? ?Nutrition Problem: Inadequate oral intake ?Etiology: inability to eat ? ? ? ? ?Signs/Symptoms: NPO status ? ? ? ?Interventions: Tube feeding, Refer to RD note for recommendations ? ?Estimated body mass index is 21.83 kg/m? as calculated from the following: ?  Height as of this encounter: 5\' 11"  (1.803 m). ?  Weight as of this encounter: 71 kg. ? ?DVT prophylaxis: Heparin  ?code Status: Full code  ?family Communication: None at bedside  ?disposition Plan:  Status is: Inpatient ?Remains inpatient appropriate because: sbo ?  ?Consultants:  ?surgery ? ?Procedures: Exploratory laparotomy and lysis of adhesions and small bowel resection ?Antimicrobials:none ? ?Subjective: ?Resting in bed holding NG tube complaining of abdominal pain asking for stronger pain medicine ?IV Tylenol running ?No flatus no bowel movements ? ?Objective: ?Vitals:  ? 05/24/21 2114 05/25/21 0600 05/25/21 0852 05/25/21 1127  ?BP: 109/76 108/74 116/74   ?Pulse: 91 88 97   ?Resp: 18  18    ?Temp: 98.7 ?F (37.1 ?C) 98.5 ?F (36.9 ?C) 98.3 ?F (36.8 ?C)   ?TempSrc:  Oral Oral   ?SpO2: 99% 99% 100%   ?Weight:    71 kg  ?Height:      ? ? ?Intake/Output Summary (Last 24 hours) at 05/25/2021 1425 ?Last data filed at 05/25/2021 1200 ?Gross per 24 hour  ?Intake 2516.74 ml  ?Output 2800 ml  ?Net -283.26 ml  ? ?Filed Weights  ? 05/20/21 1643 05/20/21 1949 05/25/21 1127  ?Weight: 70.8 kg 72.3 kg 71 kg  ? ? ?Examination: ? ?General exam: Appears calm and comfortable  ?Respiratory system: Clear to auscultation. Respiratory effort normal. ?Cardiovascular system: S1 & S2 heard, RRR. No JVD, murmurs, rubs, gallops or clicks. No pedal edema. ?Gastrointestinal system: Abdomen is nondistended, soft and tender. No organomegaly or masses felt.decreased bowel sounds heard. ?Central nervous system: Alert and oriented. No focal neurological deficits. ?Extremities: Symmetric 5 x 5 power. ?Skin: No rashes, lesions or ulcers ?Psychiatry: Judgement and insight appear normal. Mood & affect appropriate.  ? ? ? ?Data Reviewed: I have personally reviewed following labs and imaging studies ? ?CBC: ?Recent Labs  ?Lab 05/19/21 ?0821 05/20/21 ?0225 05/22/21 ?0959 05/23/21 ?05/25/21 05/24/21 ?0351 05/25/21 ?05/27/21  ?WBC 10.6* 7.0 5.4 5.6 3.5* 10.9*  ?NEUTROABS 8.1*  --  3.9 4.1 2.2 9.6*  ?HGB 8.9* 8.8* 7.9* 7.4* 7.4* 8.4*  ?HCT 30.2* 30.4* 25.3* 24.2* 25.1* 27.6*  ?MCV 74.2* 74.3* 71.9* 73.3* 74.3* 75.6*  ?PLT 369 313 294 253 253 203  ? ?Basic Metabolic Panel: ?Recent Labs  ?  Lab 05/19/21 ?2108 05/19/21 ?2234 05/22/21 ?0435 05/22/21 ?5790 05/23/21 ?3833 05/24/21 ?0351 05/24/21 ?1556 05/25/21 ?3832  ?NA  --    < > 134* 133* 136 138  --  138  ?K  --    < > 3.2* 3.1* 3.0* 2.9* 3.7 4.0  ?CL  --    < > 87* 87* 91* 91*  --  97*  ?CO2  --    < > 31 32 31 35*  --  31  ?GLUCOSE  --    < > 107* 107* 93 105*  --  135*  ?BUN  --    < > 73* 73* 57* 33*  --  29*  ?CREATININE 2.23*   < > 2.61* 2.32* 1.92* 1.59*  --  1.65*  ?CALCIUM  --    < > 8.1* 8.2* 8.4* 8.2*   --  7.4*  ?MG 2.3  --   --  2.9* 2.9* 2.8*  --  2.3  ?PHOS  --   --   --  5.6* 4.8* 2.6  --  3.8  ? < > = values in this interval not displayed.  ? ?GFR: ?Estimated Creatinine Clearance: 43.6 mL/min (A) (by C-G formula based on SCr of 1.65 mg/dL (H)). ?Liver Function Tests: ?Recent Labs  ?Lab 05/19/21 ?0821 05/22/21 ?0959 05/23/21 ?9191 05/24/21 ?0351 05/25/21 ?6606  ?AST 24 15 17  13* 17  ?ALT 12 11 10 9 11   ?ALKPHOS 72 58 52 54 44  ?BILITOT 1.0 1.2 0.8 0.9 1.0  ?PROT 8.6* 7.1 6.6 6.3* 5.6*  ?ALBUMIN 4.7 3.7 3.4* 3.2* 2.7*  ? ?Recent Labs  ?Lab 05/20/21 ?0225  ?LIPASE 34  ? ?No results for input(s): AMMONIA in the last 168 hours. ?Coagulation Profile: ?No results for input(s): INR, PROTIME in the last 168 hours. ?Cardiac Enzymes: ?No results for input(s): CKTOTAL, CKMB, CKMBINDEX, TROPONINI in the last 168 hours. ?BNP (last 3 results) ?No results for input(s): PROBNP in the last 8760 hours. ?HbA1C: ?No results for input(s): HGBA1C in the last 72 hours. ?CBG: ?Recent Labs  ?Lab 05/24/21 ?2019 05/24/21 ?2359 05/25/21 ?0445 05/25/21 ?0809 05/25/21 ?1110  ?GLUCAP 92 103* 117* 120* 141*  ? ?Lipid Profile: ?No results for input(s): CHOL, HDL, LDLCALC, TRIG, CHOLHDL, LDLDIRECT in the last 72 hours. ?Thyroid Function Tests: ?No results for input(s): TSH, T4TOTAL, FREET4, T3FREE, THYROIDAB in the last 72 hours. ?Anemia Panel: ?No results for input(s): VITAMINB12, FOLATE, FERRITIN, TIBC, IRON, RETICCTPCT in the last 72 hours. ?Sepsis Labs: ?No results for input(s): PROCALCITON, LATICACIDVEN in the last 168 hours. ? ?No results found for this or any previous visit (from the past 240 hour(s)).  ? ? ? ? ? ?Radiology Studies: ?DG Abd Portable 1V-Small Bowel Obstruction Protocol-24 hr delay ? ?Result Date: 05/24/2021 ?CLINICAL DATA:  Encounter for small bowel obstruction. EXAM: PORTABLE ABDOMEN - 1 VIEW COMPARISON:  Yesterday's study at 9:57 p.m. FINDINGS: Flat plate single-view is obtained at 4:40 a.m., 05/24/2021. Upper to mid  abdominal small bowel dilatation continues to measure up to 5.3 cm. NGT proximal side hole and tip are just below the EG junction and could be advanced further in for optimal placement. This is unchanged. Scattered colonic gas is seen at least as far as the distal descending colon. The visceral shadows are stable. There is no supine evidence of free air. Degenerative change lumbar spine, lower thoracic spine. IMPRESSION: NGT tip remains in the proximal stomach and could be advanced further in for optimal placement. There is no improvement or worsening  in small bowel dilatation. Electronically Signed   By: Almira BarKeith  Chesser M.D.   On: 05/24/2021 06:49  ? ?DG Abd Portable 1V-Small Bowel Obstruction Protocol-initial, 8 hr delay ? ?Result Date: 05/23/2021 ?CLINICAL DATA:  Small bowel obstruction. EXAM: PORTABLE ABDOMEN - 1 VIEW COMPARISON:  Abdominal x-ray 05/22/2021. FINDINGS: Dilated small bowel loops appear unchanged measuring up to 5.3 cm. Nasogastric tube tip is in the proximal stomach just beyond the gastroesophageal junction similar to the prior exam. IMPRESSION: 1. Nasogastric tube tip in the proximal stomach, unchanged. 2. Stable dilated small bowel compatible with small-bowel obstruction. Electronically Signed   By: Darliss CheneyAmy  Guttmann M.D.   On: 05/23/2021 22:06  ? ?US EKG SITE RITE ? ?Result Date: 05/24/2021 ?If [employer] image not attached, placement could not be confirmed due to current cardiac rhythm.  ? ? ? ? ? ?Scheduled Meds: ? Chlorhexidine Gluconate Cloth  6 each Topical Daily  ? heparin  5,000 Units Subcutaneous Q8H  ? insulin aspart  0-9 Units Subcutaneous Q6H  ? sodium chloride flush  10-40 mL Intracatheter Q12H  ? ?Continuous Infusions: ? sodium chloride 100 mL/hr at 05/25/21 1401  ? sodium chloride    ? acetaminophen 1,000 mg (05/25/21 0603)  ? methocarbamol (ROBAXIN) IV 500 mg (05/25/21 1419)  ? TPN ADULT (ION)    ? ? ? LOS: 5 days  ? ? ?Time spent: 37 min ? ?Alwyn RenElizabeth G Jahnya Trindade, MD ?05/25/2021, 2:25 PM    ?

## 2021-05-25 NOTE — Progress Notes (Signed)
1 Day Post-Op  ? ?Subjective/Chief Complaint: ?No flatus, pain controlled ? ? ?Objective: ?Vital signs in last 24 hours: ?Temp:  [98 ?F (36.7 ?C)-98.7 ?F (37.1 ?C)] 98.3 ?F (36.8 ?C) (04/19 2549) ?Pulse Rate:  [68-97] 97 (04/19 0852) ?Resp:  [16-20] 18 (04/19 8264) ?BP: (108-152)/(43-76) 116/74 (04/19 1583) ?SpO2:  [93 %-100 %] 100 % (04/19 0852) ?Last BM Date : 05/24/21 ? ?Intake/Output from previous day: ?04/18 0701 - 04/19 0700 ?In: 2716.7 [P.O.:200; I.V.:1621.3; Blood:315; IV Piggyback:580.4] ?Out: 2710 [Urine:800; Emesis/NG output:1900; Blood:10] ?Intake/Output this shift: ?Total I/O ?In: 270 [P.O.:270] ?Out: 950 [Urine:250; Emesis/NG output:700] ? ?General appearance: alert ?Resp: clear to auscultation bilaterally ?Cardio: regular ?GI: dressing dry, approp tender ? ?Lab Results:  ?Recent Labs  ?  05/24/21 ?0351 05/25/21 ?0940  ?WBC 3.5* 10.9*  ?HGB 7.4* 8.4*  ?HCT 25.1* 27.6*  ?PLT 253 203  ? ?BMET ?Recent Labs  ?  05/24/21 ?0351 05/24/21 ?1556 05/25/21 ?7680  ?NA 138  --  138  ?K 2.9* 3.7 4.0  ?CL 91*  --  97*  ?CO2 35*  --  31  ?GLUCOSE 105*  --  135*  ?BUN 33*  --  29*  ?CREATININE 1.59*  --  1.65*  ?CALCIUM 8.2*  --  7.4*  ? ?PT/INR ?No results for input(s): LABPROT, INR in the last 72 hours. ?ABG ?No results for input(s): PHART, HCO3 in the last 72 hours. ? ?Invalid input(s): PCO2, PO2 ? ?Studies/Results: ?DG Abd Portable 1V-Small Bowel Obstruction Protocol-24 hr delay ? ?Result Date: 05/24/2021 ?CLINICAL DATA:  Encounter for small bowel obstruction. EXAM: PORTABLE ABDOMEN - 1 VIEW COMPARISON:  Yesterday's study at 9:57 p.m. FINDINGS: Flat plate single-view is obtained at 4:40 a.m., 05/24/2021. Upper to mid abdominal small bowel dilatation continues to measure up to 5.3 cm. NGT proximal side hole and tip are just below the EG junction and could be advanced further in for optimal placement. This is unchanged. Scattered colonic gas is seen at least as far as the distal descending colon. The visceral shadows  are stable. There is no supine evidence of free air. Degenerative change lumbar spine, lower thoracic spine. IMPRESSION: NGT tip remains in the proximal stomach and could be advanced further in for optimal placement. There is no improvement or worsening in small bowel dilatation. Electronically Signed   By: Almira Bar M.D.   On: 05/24/2021 06:49  ? ?DG Abd Portable 1V-Small Bowel Obstruction Protocol-initial, 8 hr delay ? ?Result Date: 05/23/2021 ?CLINICAL DATA:  Small bowel obstruction. EXAM: PORTABLE ABDOMEN - 1 VIEW COMPARISON:  Abdominal x-ray 05/22/2021. FINDINGS: Dilated small bowel loops appear unchanged measuring up to 5.3 cm. Nasogastric tube tip is in the proximal stomach just beyond the gastroesophageal junction similar to the prior exam. IMPRESSION: 1. Nasogastric tube tip in the proximal stomach, unchanged. 2. Stable dilated small bowel compatible with small-bowel obstruction. Electronically Signed   By: Darliss Cheney M.D.   On: 05/23/2021 22:06  ? ?[place] EKG SITE RITE ? ?Result Date: 05/24/2021 ?If [employer] not attached, placement could not be confirmed due to current cardiac rhythm.  ? ?Anti-infectives: ?Anti-infectives (From admission, onward)  ? ? None  ? ?  ? ? ?Assessment/Plan: ?POD 1 elap/loa/sbr-[place] ?- CT 4/14 showed small bowel obstruction with a transition zone suspected within the medial aspect of the mid right abdomen; large amount of stool throughout the large bowel, with impacted stool noted within the sigmoid colon and rectum ?- continue ng tube, dc foley today ?-start TPN today ?ID -  none ?FEN - IVF, NPO/NGT to LIWS ?VTE - sq heparin ?Foley - none ?ABL anemia  ?AKI ?DM ?HLD ?Justin Yang ?05/25/2021 ? ?

## 2021-05-25 NOTE — Progress Notes (Signed)
Peripherally Inserted Central Catheter Placement ? ?The IV Nurse has discussed with the patient and/or persons authorized to consent for the patient, the purpose of this procedure and the potential benefits and risks involved with this procedure.  The benefits include less needle sticks, lab draws from the catheter, and the patient may be discharged home with the catheter. Risks include, but not limited to, infection, bleeding, blood clot (thrombus formation), and puncture of an artery; nerve damage and irregular heartbeat and possibility to perform a PICC exchange if needed/ordered by physician.  Alternatives to this procedure were also discussed.  Bard Power PICC patient education guide, fact sheet on infection prevention and patient information card has been provided to patient /or left at bedside.   ? ?PICC Placement Documentation  ?PICC Double Lumen 05/25/21 Right Basilic 39 cm 0 cm (Active)  ?Indication for Insertion or Continuance of Line Administration of hyperosmolar/irritating solutions (i.e. TPN, Vancomycin, etc.) 05/25/21 0933  ?Exposed Catheter (cm) 0 cm 05/25/21 0933  ?Site Assessment Clean, Dry, Intact 05/25/21 0933  ?Lumen #1 Status Flushed;Saline locked;Blood return noted 05/25/21 0933  ?Lumen #2 Status Flushed;Saline locked;Blood return noted 05/25/21 0933  ?Dressing Type Securing device;Transparent 05/25/21 0933  ?Dressing Status Antimicrobial disc in place 05/25/21 0933  ?Safety Lock Not Applicable 05/25/21 0933  ?Line Care Connections checked and tightened 05/25/21 0933  ?Dressing Intervention New dressing 05/25/21 0933  ?Dressing Change Due 06/01/21 05/25/21 0933  ? ? ? ? ? ?Franne Grip Renee ?05/25/2021, 9:35 AM ? ?

## 2021-05-25 NOTE — Progress Notes (Addendum)
PHARMACY - TOTAL PARENTERAL NUTRITION CONSULT NOTE  ? ?Indication: Small bowel obstruction ? ?Patient Measurements: ?Height: 5\' 11"  (180.3 cm) ?Weight: 72.3 kg (159 lb 6.3 oz) ?IBW/kg (Calculated) : 75.3 ?TPN AdjBW (KG): 70.8 ?Body mass index is 22.23 kg/m?. ?Usual Weight: 81.8 kg ? ?Assessment: [name] with PMH of T2DM, HLD, chronic microcytic anemia, h/o SBO requiring lysis of adhesions (2017) presented to the ED on 4/13 for new onset nausea/vomiting starting on 4/12. KUB on admit was c/f high-grade SBO. Pt was made NPO and NGT was placed for decompression. IVF were initiated. Pt failed to respond to conservative measures since 4/14 and underwent surgery on 4/18 - laparostomy with lysis of adhesions and small bowel resection. ? ?Patient was eating normal diet prior to symptom onset. Pt reports his last meal was bacon, egg, and cheese sandwich from a gas station prior to nausea/vomiting starting on 4/12. Pt has been NPO since admission on 4/13. Pharmacy consulted to initiate and manage TPN therapy on 4/19. ? ?Glucose / Insulin: BG <180, sSSI (2u given in last 24hrs) ?Electrolytes: Cl 97, coCa 8.4, all others wnl ?Renal: SCr 1.65 trending up, BUN 29 ?Hepatic: LFTs wnl, albumin 2.7 ?Intake / Output; MIVF: NGT output: 8900, UOP: 100 (not documented accurately), LBM 4/18 ?MIVF: NS @ 100 ml/hr ?GI Imaging: None since surgery on 4/18 ?GI Surgeries / Procedures:  ?4/18 - laparostomy with lysis of adhesions and small bowel resection ? ?Central access: PICC placed 4/19 ?TPN start date: 05/25/21 ? ?Nutritional Goals: ?Goal TPN rate is 90 mL/hr (provides 110 g of protein and 2160 kcals per day) ? ?RD Assessment: ?Estimated Needs ?Total Energy Estimated Needs: 2000-2200 kcal/d ?Total Protein Estimated Needs: 100-110 g/d ?Total Fluid Estimated Needs: >/=2.2 L/d ? ?Current Nutrition:  ?NPO ? ?Plan:  ?Start TPN at 45mL/hr at 1800 (50% of goal rate) ?Electrolytes in TPN: Na 70mEq/L, K 13mEq/L, Ca 48mEq/L, Mg 10mEq/L, and Phos  23mmol/L. Cl:Ac 2:1 ?Add standard MVI and trace elements to TPN ?Add thiamine to TPN due to high risk for refeeding syndrome.  ?Initiate Sensitive q6h SSI and adjust as needed  ?Reduce MIVF to 45 mL/hr at 1800 ?Monitor TPN labs daily until stable, then on Mon/Thurs. ? ?12m ?05/25/2021,8:05 AM ? ?

## 2021-05-25 NOTE — Progress Notes (Addendum)
Nutrition Follow-up ? ?DOCUMENTATION CODES:  ?Not applicable ? ?INTERVENTION:  ?TPN management per Pharmacy ? ?Monitor magnesium, potassium, and phosphorus BID for at least 3 days, MD to replete as needed, as pt is at risk for refeeding syndrome given no po intake >days. ? ?NUTRITION DIAGNOSIS:  ?Inadequate oral intake related to inability to eat as evidenced by NPO status. -- ongoing ? ?GOAL:  ?Patient will meet greater than or equal to 90% of their needs -- to be addressed with TPN ? ?MONITOR:  ?Diet advancement, I & O's ? ?REASON FOR ASSESSMENT:  ?Malnutrition Screening Tool ?  ? ?ASSESSMENT:  ?66 y.o. male with hx of DM type 2, HLD, and hx of SBO presented to ED with nausea and vomiting for ~ 2 weeks. Poor PO during this time. Found to have AKI and imaging suggestive of SBO. ? ?4/14 - NGT placed to LIWS ?4/18 -  s/p laparotomy w/ LOA and small bowel resection ? ?Pt had SBO which failed to resolve conservatively and pt required laparotomy with lysis of adhesions and small bowel resection. Pt being initiated on TPN today at 45 ml/hr. Per Pharmacy, goal TPN rate is 90 ml/hr (provides 110 grams protein and 2160 calories per day). Per Pharmacy note, pt to have thiamine added to TPN due to high risk for refeeding syndrome as pt has been NPO since admit, >6 days. NGT continues to suction. RD attempted to visit pt today x 2 attempts; pt unavailable both times. Will attempt to obtain diet/weight history and nutrition-focused physical exam upon follow-up.  ? ?UOP: 950 ml x24 hours ?NGT output: 1700 ml x24 hours ?I/O: -5748 ml since admit ? ?Medications: ? heparin  5,000 Units Subcutaneous Q8H  ? insulin aspart  0-9 Units Subcutaneous Q6H  ? ?Labs: ?Recent Labs  ?Lab 05/23/21 ?[id number] 05/24/21 ?0351 05/24/21 ?1556 05/25/21 ?[id number]  ?NA 136 138  --  138  ?K 3.0* 2.9* 3.7 4.0  ?CL 91* 91*  --  97*  ?CO2 31 35*  --  31  ?BUN 57* 33*  --  29*  ?CREATININE 1.92* 1.59*  --  1.65*  ?CALCIUM 8.4* 8.2*  --  7.4*  ?MG 2.9* 2.8*  --  2.3   ?PHOS 4.8* 2.6  --  3.8  ?GLUCOSE 93 105*  --  135*  ?Corrected Calcium 8.44 (L) ?Hgb 8.4 ?CBGs: 92-149 x24 hours ? ?Diet Order:   ?Diet Order   ? ?       ?  Diet NPO time specified Except for: Ice Chips  Diet effective now       ?  ? ?  ?  ? ?  ? ?EDUCATION NEEDS:  ?No education needs have been identified at this time ? ?Skin:  Skin Assessment: Skin Integrity Issues: ?Skin Integrity Issues:: Incisions ?Incisions: abdomen ? ?Last BM:  4/18 ? ?Height:  ?Ht Readings from Last 1 Encounters:  ?05/20/21 5\' 11"  (1.803 m)  ? ?Weight:  ?Wt Readings from Last 1 Encounters:  ?05/25/21 71 kg  ? ?Ideal Body Weight:  78.2 kg ? ?BMI:  Body mass index is 21.83 kg/m?. ? ?Estimated Nutritional Needs:  ?Kcal:  2000-2200 kcal/d ?Protein:  100-110 g/d ?Fluid:  >/=2.2 L/d ? ? ?Theone Yang., MS, RD, LDN (she/her/hers) ?RD pager number and weekend/on-call pager number located in [place]. ?

## 2021-05-26 DIAGNOSIS — K56609 Unspecified intestinal obstruction, unspecified as to partial versus complete obstruction: Secondary | ICD-10-CM | POA: Diagnosis not present

## 2021-05-26 LAB — COMPREHENSIVE METABOLIC PANEL
ALT: 12 U/L (ref 0–44)
AST: 19 U/L (ref 15–41)
Albumin: 2.4 g/dL — ABNORMAL LOW (ref 3.5–5.0)
Alkaline Phosphatase: 44 U/L (ref 38–126)
Anion gap: 6 (ref 5–15)
BUN: 27 mg/dL — ABNORMAL HIGH (ref 8–23)
CO2: 30 mmol/L (ref 22–32)
Calcium: 7.9 mg/dL — ABNORMAL LOW (ref 8.9–10.3)
Chloride: 101 mmol/L (ref 98–111)
Creatinine, Ser: 1.4 mg/dL — ABNORMAL HIGH (ref 0.61–1.24)
GFR, Estimated: 55 mL/min — ABNORMAL LOW (ref >60)
Glucose, Bld: 141 mg/dL — ABNORMAL HIGH (ref 70–99)
Potassium: 3.8 mmol/L (ref 3.5–5.1)
Sodium: 137 mmol/L (ref 135–145)
Total Bilirubin: 0.4 mg/dL (ref 0.3–1.2)
Total Protein: 5.4 g/dL — ABNORMAL LOW (ref 6.5–8.1)

## 2021-05-26 LAB — CBC WITH DIFFERENTIAL/PLATELET
Abs Immature Granulocytes: 0 10*3/uL (ref 0.00–0.07)
Basophils Absolute: 0 10*3/uL (ref 0.0–0.1)
Basophils Relative: 0 %
Eosinophils Absolute: 0 10*3/uL (ref 0.0–0.5)
Eosinophils Relative: 0 %
HCT: 24.7 % — ABNORMAL LOW (ref 39.0–52.0)
Hemoglobin: 7.4 g/dL — ABNORMAL LOW (ref 13.0–17.0)
Lymphocytes Relative: 5 %
Lymphs Abs: 0.3 10*3/uL — ABNORMAL LOW (ref 0.7–4.0)
MCH: 23.1 pg — ABNORMAL LOW (ref 26.0–34.0)
MCHC: 30 g/dL (ref 30.0–36.0)
MCV: 77.2 fL — ABNORMAL LOW (ref 80.0–100.0)
Monocytes Absolute: 0.3 10*3/uL (ref 0.1–1.0)
Monocytes Relative: 5 %
Neutro Abs: 5.3 10*3/uL (ref 1.7–7.7)
Neutrophils Relative %: 90 %
Platelets: 192 10*3/uL (ref 150–400)
RBC: 3.2 MIL/uL — ABNORMAL LOW (ref 4.22–5.81)
RDW: 18.7 % — ABNORMAL HIGH (ref 11.5–15.5)
WBC: 5.9 10*3/uL (ref 4.0–10.5)
nRBC: 0 % (ref 0.0–0.2)
nRBC: 0 /100 WBC

## 2021-05-26 LAB — GLUCOSE, CAPILLARY
Glucose-Capillary: 124 mg/dL — ABNORMAL HIGH (ref 70–99)
Glucose-Capillary: 129 mg/dL — ABNORMAL HIGH (ref 70–99)
Glucose-Capillary: 136 mg/dL — ABNORMAL HIGH (ref 70–99)
Glucose-Capillary: 142 mg/dL — ABNORMAL HIGH (ref 70–99)
Glucose-Capillary: 142 mg/dL — ABNORMAL HIGH (ref 70–99)
Glucose-Capillary: 142 mg/dL — ABNORMAL HIGH (ref 70–99)

## 2021-05-26 LAB — MAGNESIUM: Magnesium: 2.3 mg/dL (ref 1.7–2.4)

## 2021-05-26 LAB — TRIGLYCERIDES: Triglycerides: 98 mg/dL (ref <150)

## 2021-05-26 LAB — PHOSPHORUS: Phosphorus: 1.8 mg/dL — ABNORMAL LOW (ref 2.5–4.6)

## 2021-05-26 MED ORDER — METHOCARBAMOL 1000 MG/10ML IJ SOLN
750.0000 mg | Freq: Four times a day (QID) | INTRAVENOUS | Status: DC
  Administered 2021-05-26 – 2021-05-27 (×3): 750 mg via INTRAVENOUS
  Filled 2021-05-26 (×3): qty 7.5

## 2021-05-26 MED ORDER — ACETAMINOPHEN 10 MG/ML IV SOLN
1000.0000 mg | Freq: Four times a day (QID) | INTRAVENOUS | Status: AC
  Administered 2021-05-26 – 2021-05-27 (×4): 1000 mg via INTRAVENOUS
  Filled 2021-05-26 (×5): qty 100

## 2021-05-26 MED ORDER — TRAVASOL 10 % IV SOLN
INTRAVENOUS | Status: AC
  Filled 2021-05-26: qty 1101.6

## 2021-05-26 NOTE — Progress Notes (Signed)
?PROGRESS NOTE ? ? ? ?LAKIN RAMSOUR  [id number] DOB: 1955-03-03 DOA: 05/19/2021 ?PCP: Patient, No Pcp Per (Inactive)  ? ?Brief Narrative: 66 year old male with small bowel obstruction.  He has a history of type 2 diabetes hyperlipidemia and a history of small bowel obstruction requiring lysis of adhesions in 2017. ? ?Assessment & Plan: ?  ?Principal Problem: ?  Small bowel obstruction ([place]) ?Active Problems: ?  Hypokalemia ?  Acute kidney injury ([place]) ?  Type 2 diabetes mellitus ([place]) ?  Microcytic anemia ?  Hyperlipidemia associated with type 2 diabetes mellitus ([place]) ? ?#1 small bowel obstruction-status post laparotomy with lysis of adhesions and small bowel resection. ?Patient has had no bowel movements or flatus. ?Surgery following and recommends to continue NG tube and TPN. ?Oob  ?Increase activity ? ?#2 AKI creatinine 1.65 continue IV fluids ? ?#3 type 2 diabetes continue SSI hold metformin ? ?#4 hyperlipidemia holding statin while n.p.o. ? ?#5 hypokalemia repleted ? ?Nutrition Problem: Inadequate oral intake ?Etiology: inability to eat ? ?Signs/Symptoms: NPO status ? ? ?Interventions: Tube feeding, Refer to RD note for recommendations ? ?Estimated body mass index is 21.83 kg/m? as calculated from the following: ?  Height as of this encounter: 5\' 11"  (1.803 m). ?  Weight as of this encounter: 71 kg. ? ?DVT prophylaxis: Heparin  ?code Status: Full code  ?family Communication: None at bedside  ?disposition Plan:  Status is: Inpatient ?Remains inpatient appropriate because: sbo ?  ?Consultants:  ?surgery ? ?Procedures: Exploratory laparotomy and lysis of adhesions and small bowel resection ?Antimicrobials:none ? ?Subjective:resting in bed ?Ambulated multiple times yesterday ?No flatus no bowel movements ? ?Objective: ?Vitals:  ? 05/25/21 1127 05/25/21 1639 05/25/21 2029 05/26/21 0936  ?BP:  111/73 111/65 114/72  ?Pulse:  94 90 92  ?Resp:  16 18 18   ?Temp:  97.9 ?F (36.6 ?C) 98.5 ?F (36.9 ?C) 99.1 ?F (37.3 ?C)   ?TempSrc:  Oral Oral Oral  ?SpO2:  97% 98% 96%  ?Weight: 71 kg     ?Height:      ? ? ?Intake/Output Summary (Last 24 hours) at 05/26/2021 1229 ?Last data filed at 05/26/2021 0900 ?Gross per 24 hour  ?Intake 3307.75 ml  ?Output 1000 ml  ?Net 2307.75 ml  ? ? ?Filed Weights  ? 05/20/21 1643 05/20/21 1949 05/25/21 1127  ?Weight: 70.8 kg 72.3 kg 71 kg  ? ? ?Examination: ? ?General exam: Appears in nad ?Respiratory system: Clear to auscultation. Respiratory effort normal. ?Cardiovascular system: S1 & S2 heard, RRR. No JVD, murmurs, rubs, gallops or clicks. No pedal edema. ?Gastrointestinal system: Abdomen is nondistended, soft and tender. No organomegaly or masses felt.decreased bowel sounds heard. ?Central nervous system: Alert and oriented. No focal neurological deficits. ?Extremities: Symmetric 5 x 5 power. ?Skin: No rashes, lesions or ulcers ?Psychiatry: Judgement and insight appear normal. Mood & affect appropriate.  ? ? ? ?Data Reviewed: I have personally reviewed following labs and imaging studies ? ?CBC: ?Recent Labs  ?Lab 05/22/21 ?0959 05/23/21 ?[id number] 05/24/21 ?0351 05/25/21 ?[id number] 05/26/21 ?[id number]  ?WBC 5.4 5.6 3.5* 10.9* 5.9  ?NEUTROABS 3.9 4.1 2.2 9.6* 5.3  ?HGB 7.9* 7.4* 7.4* 8.4* 7.4*  ?HCT 25.3* 24.2* 25.1* 27.6* 24.7*  ?MCV 71.9* 73.3* 74.3* 75.6* 77.2*  ?PLT 294 253 253 203 192  ? ? ?Basic Metabolic Panel: ?Recent Labs  ?Lab 05/22/21 ?0959 05/23/21 ?[id number] 05/24/21 ?0351 05/24/21 ?1556 05/25/21 ?[id number] 05/26/21 ?[id number]  ?NA 133* 136 138  --  138 137  ?K 3.1* 3.0* 2.9* 3.7  4.0 3.8  ?CL 87* 91* 91*  --  97* 101  ?CO2 32 31 35*  --  31 30  ?GLUCOSE 107* 93 105*  --  135* 141*  ?BUN 73* 57* 33*  --  29* 27*  ?CREATININE 2.32* 1.92* 1.59*  --  1.65* 1.40*  ?CALCIUM 8.2* 8.4* 8.2*  --  7.4* 7.9*  ?MG 2.9* 2.9* 2.8*  --  2.3 2.3  ?PHOS 5.6* 4.8* 2.6  --  3.8 1.8*  ? ? ?GFR: ?Estimated Creatinine Clearance: 51.4 mL/min (A) (by C-G formula based on SCr of 1.4 mg/dL (H)). ?Liver Function Tests: ?Recent Labs  ?Lab  05/22/21 ?0959 05/23/21 ?[id number] 05/24/21 ?0351 05/25/21 ?[id number] 05/26/21 ?[id number]  ?AST 15 17 13* 17 19  ?ALT 11 10 9 11 12   ?ALKPHOS 58 52 54 44 44  ?BILITOT 1.2 0.8 0.9 1.0 0.4  ?PROT 7.1 6.6 6.3* 5.6* 5.4*  ?ALBUMIN 3.7 3.4* 3.2* 2.7* 2.4*  ? ? ?Recent Labs  ?Lab 05/20/21 ?0225  ?LIPASE 34  ? ? ?No results for input(s): AMMONIA in the last 168 hours. ?Coagulation Profile: ?No results for input(s): INR, PROTIME in the last 168 hours. ?Cardiac Enzymes: ?No results for input(s): CKTOTAL, CKMB, CKMBINDEX, TROPONINI in the last 168 hours. ?BNP (last 3 results) ?No results for input(s): PROBNP in the last 8760 hours. ?HbA1C: ?No results for input(s): HGBA1C in the last 72 hours. ?CBG: ?Recent Labs  ?Lab 05/25/21 ?1636 05/26/21 ?[id number] 05/26/21 ?[id number] 05/26/21 ?[id number] 05/26/21 ?1155  ?GLUCAP 127* 136* 129* 142* 124*  ? ? ?Lipid Profile: ?Recent Labs  ?  05/26/21 ?[id number]  ?TRIG 98  ? ?Thyroid Function Tests: ?No results for input(s): TSH, T4TOTAL, FREET4, T3FREE, THYROIDAB in the last 72 hours. ?Anemia Panel: ?No results for input(s): VITAMINB12, FOLATE, FERRITIN, TIBC, IRON, RETICCTPCT in the last 72 hours. ?Sepsis Labs: ?No results for input(s): PROCALCITON, LATICACIDVEN in the last 168 hours. ? ?No results found for this or any previous visit (from the past 240 hour(s)).  ? ? ? ? ? ?Radiology Studies: ?[place] EKG SITE RITE ? ?Result Date: 05/24/2021 ?If [employer] not attached, placement could not be confirmed due to current cardiac rhythm.  ? ? ? ? ? ?Scheduled Meds: ? Chlorhexidine Gluconate Cloth  6 each Topical Daily  ? heparin  5,000 Units Subcutaneous Q8H  ? insulin aspart  0-9 Units Subcutaneous Q6H  ? sodium chloride flush  10-40 mL Intracatheter Q12H  ? ?Continuous Infusions: ? sodium chloride 45 mL/hr at 05/25/21 1829  ? acetaminophen    ? methocarbamol (ROBAXIN) IV    ? TPN ADULT (ION) 45 mL/hr at 05/25/21 1759  ? TPN ADULT (ION)    ? ? ? LOS: 6 days  ? ? ?Time spent: 37 min ? ?Georgette Shell, MD ?05/26/2021, 12:29  PM   ?

## 2021-05-26 NOTE — Progress Notes (Signed)
PHARMACY - TOTAL PARENTERAL NUTRITION CONSULT NOTE  ? ?Indication: Small bowel obstruction ? ?Patient Measurements: ?Height: 5\' 11"  (180.3 cm) ?Weight: 71 kg (156 lb 8.4 oz) ?IBW/kg (Calculated) : 75.3 ?TPN AdjBW (KG): 70.8 ?Body mass index is 21.83 kg/m?. ?Usual Weight: 81.8 kg ? ?Assessment: [name] with PMH of T2DM, HLD, chronic microcytic anemia, h/o SBO requiring lysis of adhesions (2017) presented to the ED on 4/13 for new onset nausea/vomiting starting on 4/12. KUB on admit was c/f high-grade SBO. Pt was made NPO and NGT was placed for decompression. IVF were initiated. Pt failed to respond to conservative measures since 4/14 and underwent surgery on 4/18 - laparostomy with lysis of adhesions and small bowel resection. ? ?Patient was eating normal diet prior to symptom onset. Pt reports his last meal was bacon, egg, and cheese sandwich from a gas station prior to nausea/vomiting starting on 4/12. Pt has been NPO since admission on 4/13. Pharmacy consulted to initiate and manage TPN therapy on 4/19. ? ?Glucose / Insulin: BG <180, sSSI (2u given in last 24hrs) ?Electrolytes: coCa 9.2, Phos 1.8, all others wnl ?Renal: SCr trending down to 1.4, BUN 27 ?Hepatic: LFTs wnl, albumin 2.4 ?Intake / Output; MIVF: NGT output: 2400, UOP: 1100, LBM 4/19 ?MIVF: NS @ 45 ml/hr ?GI Imaging: None since surgery on 4/18 ?GI Surgeries / Procedures:  ?4/18 - laparostomy with lysis of adhesions and small bowel resection ? ?Central access: PICC placed 4/19 ?TPN start date: 05/25/21 ? ?Nutritional Goals: ?Goal TPN rate is 90 mL/hr (provides 110 g of protein and 2160 kcals per day) ? ?RD Assessment: ?Estimated Needs ?Total Energy Estimated Needs: 2000-2200 kcal/d ?Total Protein Estimated Needs: 100-110 g/d ?Total Fluid Estimated Needs: >/=2.2 L/d ? ?Current Nutrition:  ?NPO and TPN ? ?Plan:  ?Increase TPN to goal rate of 25mL/hr at 1800 Electrolytes in TPN: Change: Na to 52mEq/L, K to 19mEq/L, Ca to 13mEq/L, Phos to 57mmol/L, and Cl:Ac  to 1:1; Continue: Mg 13mEq/L ?Add standard MVI and trace elements to TPN ?Add thiamine to TPN due to high risk for refeeding syndrome.  ?Continue Sensitive q6h SSI and adjust as needed  ?Discontinue MIVF at 1800 ?Monitor TPN labs daily until stable, then on Mon/Thurs. ? ?Kaleen Mask ?05/26/2021,8:51 AM ? ?

## 2021-05-26 NOTE — Plan of Care (Signed)

## 2021-05-26 NOTE — Progress Notes (Signed)
[facility] ?Progress Note ? ?2 Days Post-Op  ?Subjective: ?CC-  ?Abdomen still sore but pain medication is helping. He did have difficulty sleeping due to the pain. Ambulated multiple times yesterday. No flatus or BM. ? ?Objective: ?Vital signs in last 24 hours: ?Temp:  [97.9 ?F (36.6 ?C)-98.5 ?F (36.9 ?C)] 98.5 ?F (36.9 ?C) (04/19 2029) ?Pulse Rate:  [90-94] 90 (04/19 2029) ?Resp:  [16-18] 18 (04/19 2029) ?BP: (111)/(65-73) 111/65 (04/19 2029) ?SpO2:  [97 %-98 %] 98 % (04/19 2029) ?Weight:  [71 kg] 71 kg (04/19 1127) ?Last BM Date : 05/25/21 ? ?Intake/Output from previous day: ?04/19 0701 - 04/20 0700 ?In: 3697.8 [P.O.:630; I.V.:2617.8; IV Piggyback:450] ?Out: 2100 [Urine:700; Emesis/NG output:1400] ?Intake/Output this shift: ?No intake/output data recorded. ? ?PE: ?Gen:  Alert, NAD, pleasant ?Abd: soft, mild distension, mild diffuse tenderness, hypoactive BS, midline incision with honeycomb dressing and some dried blood noted on dressing ? ?Lab Results:  ?Recent Labs  ?  05/25/21 ?0814 05/26/21 ?4818  ?WBC 10.9* 5.9  ?HGB 8.4* 7.4*  ?HCT 27.6* 24.7*  ?PLT 203 192  ? ?BMET ?Recent Labs  ?  05/25/21 ?5631 05/26/21 ?4970  ?NA 138 137  ?K 4.0 3.8  ?CL 97* 101  ?CO2 31 30  ?GLUCOSE 135* 141*  ?BUN 29* 27*  ?CREATININE 1.65* 1.40*  ?CALCIUM 7.4* 7.9*  ? ?PT/INR ?No results for input(s): LABPROT, INR in the last 72 hours. ?CMP  ?   ?Component Value Date/Time  ? NA 137 05/26/2021 0435  ? K 3.8 05/26/2021 0435  ? CL 101 05/26/2021 0435  ? CO2 30 05/26/2021 0435  ? GLUCOSE 141 (H) 05/26/2021 0435  ? BUN 27 (H) 05/26/2021 0435  ? CREATININE 1.40 (H) 05/26/2021 0435  ? CALCIUM 7.9 (L) 05/26/2021 0435  ? PROT 5.4 (L) 05/26/2021 0435  ? ALBUMIN 2.4 (L) 05/26/2021 0435  ? AST 19 05/26/2021 0435  ? ALT 12 05/26/2021 0435  ? ALKPHOS 44 05/26/2021 0435  ? BILITOT 0.4 05/26/2021 0435  ? GFRNONAA 55 (L) 05/26/2021 0435  ? GFRAA >60 07/06/2015 0458  ? ?Lipase  ?   ?Component Value Date/Time  ? LIPASE 34 05/20/2021 0225   ? ? ? ? ? ?Studies/Results: ?[place] EKG SITE RITE ? ?Result Date: 05/24/2021 ?If [employer] not attached, placement could not be confirmed due to current cardiac rhythm.  ? ?Anti-infectives: ?Anti-infectives (From admission, onward)  ? ? None  ? ?  ? ? ? ?Assessment/Plan ?POD 2 elap/loa/sbr-[place] ?- continue NPO/NGT to LIWS and await return in bowel function ?- continue TPN until reliably tolerating a diet ?- continue mobilizing ?- increase robaxin dose and continue scheduled IV tylenol for pain control  ? ?ID - none ?FEN - IVF, NPO/NGT to LIWS, TPN ?VTE - sq heparin ?Foley - out and voiding ? ?ABL anemia - Hgb 7.4, monitor ?AKI - Cr 1.4, improving ?DM ?HLD ? ? LOS: 6 days  ? ? ?Franne Forts, PA-C ?[facility] ?05/26/2021, 9:18 AM ?Please see Amion for pager number during day hours 7:00am-4:30pm ? ?

## 2021-05-27 ENCOUNTER — Inpatient Hospital Stay (HOSPITAL_COMMUNITY): Payer: No Typology Code available for payment source

## 2021-05-27 DIAGNOSIS — K56609 Unspecified intestinal obstruction, unspecified as to partial versus complete obstruction: Secondary | ICD-10-CM | POA: Diagnosis not present

## 2021-05-27 LAB — CBC WITH DIFFERENTIAL/PLATELET
Abs Immature Granulocytes: 0.05 10*3/uL (ref 0.00–0.07)
Basophils Absolute: 0 10*3/uL (ref 0.0–0.1)
Basophils Relative: 0 %
Eosinophils Absolute: 0 10*3/uL (ref 0.0–0.5)
Eosinophils Relative: 1 %
HCT: 25.4 % — ABNORMAL LOW (ref 39.0–52.0)
Hemoglobin: 7.4 g/dL — ABNORMAL LOW (ref 13.0–17.0)
Immature Granulocytes: 1 %
Lymphocytes Relative: 9 %
Lymphs Abs: 0.4 10*3/uL — ABNORMAL LOW (ref 0.7–4.0)
MCH: 22.4 pg — ABNORMAL LOW (ref 26.0–34.0)
MCHC: 29.1 g/dL — ABNORMAL LOW (ref 30.0–36.0)
MCV: 76.7 fL — ABNORMAL LOW (ref 80.0–100.0)
Monocytes Absolute: 0.6 10*3/uL (ref 0.1–1.0)
Monocytes Relative: 16 %
Neutro Abs: 2.8 10*3/uL (ref 1.7–7.7)
Neutrophils Relative %: 73 %
Platelets: UNDETERMINED 10*3/uL (ref 150–400)
RBC: 3.31 MIL/uL — ABNORMAL LOW (ref 4.22–5.81)
RDW: 18.8 % — ABNORMAL HIGH (ref 11.5–15.5)
Smear Review: UNDETERMINED
WBC: 3.9 10*3/uL — ABNORMAL LOW (ref 4.0–10.5)
nRBC: 0 % (ref 0.0–0.2)

## 2021-05-27 LAB — COMPREHENSIVE METABOLIC PANEL
ALT: 10 U/L (ref 0–44)
AST: 16 U/L (ref 15–41)
Albumin: 2.4 g/dL — ABNORMAL LOW (ref 3.5–5.0)
Alkaline Phosphatase: 46 U/L (ref 38–126)
Anion gap: 8 (ref 5–15)
BUN: 28 mg/dL — ABNORMAL HIGH (ref 8–23)
CO2: 32 mmol/L (ref 22–32)
Calcium: 8.2 mg/dL — ABNORMAL LOW (ref 8.9–10.3)
Chloride: 100 mmol/L (ref 98–111)
Creatinine, Ser: 1.36 mg/dL — ABNORMAL HIGH (ref 0.61–1.24)
GFR, Estimated: 57 mL/min — ABNORMAL LOW (ref >60)
Glucose, Bld: 187 mg/dL — ABNORMAL HIGH (ref 70–99)
Potassium: 3.4 mmol/L — ABNORMAL LOW (ref 3.5–5.1)
Sodium: 140 mmol/L (ref 135–145)
Total Bilirubin: 0.7 mg/dL (ref 0.3–1.2)
Total Protein: 5.9 g/dL — ABNORMAL LOW (ref 6.5–8.1)

## 2021-05-27 LAB — PHOSPHORUS: Phosphorus: 2.4 mg/dL — ABNORMAL LOW (ref 2.5–4.6)

## 2021-05-27 LAB — MAGNESIUM: Magnesium: 2.3 mg/dL (ref 1.7–2.4)

## 2021-05-27 LAB — GLUCOSE, CAPILLARY
Glucose-Capillary: 194 mg/dL — ABNORMAL HIGH (ref 70–99)
Glucose-Capillary: 194 mg/dL — ABNORMAL HIGH (ref 70–99)
Glucose-Capillary: 204 mg/dL — ABNORMAL HIGH (ref 70–99)
Glucose-Capillary: 206 mg/dL — ABNORMAL HIGH (ref 70–99)

## 2021-05-27 LAB — PLATELET COUNT: Platelets: 193 10*3/uL (ref 150–400)

## 2021-05-27 MED ORDER — POTASSIUM CHLORIDE 10 MEQ/50ML IV SOLN
10.0000 meq | INTRAVENOUS | Status: AC
  Administered 2021-05-27 (×3): 10 meq via INTRAVENOUS
  Filled 2021-05-27 (×3): qty 50

## 2021-05-27 MED ORDER — TRAVASOL 10 % IV SOLN
INTRAVENOUS | Status: AC
  Filled 2021-05-27: qty 1101.6

## 2021-05-27 MED ORDER — SODIUM CHLORIDE 0.9 % IV SOLN
250.0000 mg | Freq: Every day | INTRAVENOUS | Status: AC
  Administered 2021-05-27 – 2021-05-30 (×4): 250 mg via INTRAVENOUS
  Filled 2021-05-27 (×4): qty 20

## 2021-05-27 MED ORDER — ACETAMINOPHEN 10 MG/ML IV SOLN
1000.0000 mg | Freq: Four times a day (QID) | INTRAVENOUS | Status: AC
  Administered 2021-05-27 – 2021-05-28 (×4): 1000 mg via INTRAVENOUS
  Filled 2021-05-27 (×4): qty 100

## 2021-05-27 MED ORDER — METHOCARBAMOL 1000 MG/10ML IJ SOLN
500.0000 mg | Freq: Four times a day (QID) | INTRAVENOUS | Status: DC
  Administered 2021-05-27 – 2021-05-30 (×11): 500 mg via INTRAVENOUS
  Filled 2021-05-27: qty 5
  Filled 2021-05-27: qty 500
  Filled 2021-05-27 (×12): qty 5
  Filled 2021-05-27: qty 500

## 2021-05-27 NOTE — Progress Notes (Signed)
[facility] ?Progress Note ? ?3 Days Post-Op  ?Subjective: ?CC-  ?Having lower crampy abdominal pain. Started passing a little flatus, no BM. NG with 3.7L output over the last 24 hours. He was taking in a lot of ice chips yesterday, not as much over night. Output is thicker/brown this morning. Also having some drainage from midline. ?States that he has had intermittent chills. TMAX 99.1. denies dysuria. He is coughing a little phlegm up. ? ?Objective: ?Vital signs in last 24 hours: ?Temp:  [97.8 ?F (36.6 ?C)-99.1 ?F (37.3 ?C)] 98.3 ?F (36.8 ?C) (04/21 0402) ?Pulse Rate:  [83-92] 83 (04/21 0402) ?Resp:  [18] 18 (04/21 0402) ?BP: (104-120)/(64-72) 120/64 (04/21 0402) ?SpO2:  [96 %-100 %] 99 % (04/21 0402) ?Last BM Date : 05/26/21 ? ?Intake/Output from previous day: ?04/20 0701 - 04/21 0700 ?In: 2406.7 [I.V.:2006.6; IV Piggyback:400.1] ?Out: [id number] [Urine:450; Emesis/NG output:3700] ?Intake/Output this shift: ?No intake/output data recorded. ? ?PE: ?Gen:  Alert, NAD, pleasant ?Abd: soft, mild distension, mild diffuse tenderness, hypoactive BS, midline incision with some new dark blood noted on honeycomb at distal aspect >> honeycomb removed, no erythema or fluctuance, no active drainage with palpation ? ?Lab Results:  ?Recent Labs  ?  05/26/21 ?[id number] 05/27/21 ?0502  ?WBC 5.9 3.9*  ?HGB 7.4* 7.4*  ?HCT 24.7* 25.4*  ?PLT 192 PLATELET CLUMPS NOTED ON SMEAR, UNABLE TO ESTIMATE  ? ?BMET ?Recent Labs  ?  05/26/21 ?[id number] 05/27/21 ?0502  ?NA 137 140  ?K 3.8 3.4*  ?CL 101 100  ?CO2 30 32  ?GLUCOSE 141* 187*  ?BUN 27* 28*  ?CREATININE 1.40* 1.36*  ?CALCIUM 7.9* 8.2*  ? ?PT/INR ?No results for input(s): LABPROT, INR in the last 72 hours. ?CMP  ?   ?Component Value Date/Time  ? NA 140 05/27/2021 0502  ? K 3.4 (L) 05/27/2021 0502  ? CL 100 05/27/2021 0502  ? CO2 32 05/27/2021 0502  ? GLUCOSE 187 (H) 05/27/2021 0502  ? BUN 28 (H) 05/27/2021 0502  ? CREATININE 1.36 (H) 05/27/2021 0502  ? CALCIUM 8.2 (L) 05/27/2021 0502  ?  PROT 5.9 (L) 05/27/2021 0502  ? ALBUMIN 2.4 (L) 05/27/2021 0502  ? AST 16 05/27/2021 0502  ? ALT 10 05/27/2021 0502  ? ALKPHOS 46 05/27/2021 0502  ? BILITOT 0.7 05/27/2021 0502  ? GFRNONAA 57 (L) 05/27/2021 0502  ? GFRAA >60 07/06/2015 0458  ? ?Lipase  ?   ?Component Value Date/Time  ? LIPASE 34 05/20/2021 0225  ? ? ? ? ? ?Studies/Results: ?No results found. ? ?Anti-infectives: ?Anti-infectives (From admission, onward)  ? ? None  ? ?  ? ? ? ?Assessment/Plan ?POD #3 elap/loa/sbr-[place] ?- continue NPO/NGT to LIWS and await return in bowel function. Starting to pass a little flatus but still has high/ thick NG output ?- continue TPN until reliably tolerating a diet ?- continue mobilizing ?- continue scheduled IV tylenol and IV robaxin for pain control  ?- pulm toilet, IS ?- monitor wound, no active drainage this morning, if recurs or develops erythema may need to take a few staples out ?  ?ID - none ?FEN - IVF, NPO/NGT to LIWS, TPN ?VTE - sq heparin ?Foley - out and voiding ?  ?ABL anemia - Hgb stable at 7.4, monitor ?AKI - Cr 1.36, improving ?DM ?HLD ? ? ? LOS: 7 days  ? ? ?Wellington Hampshire, PA-C ?[facility] ?05/27/2021, 8:29 AM ?Please see Amion for pager number during day hours 7:00am-4:30pm ? ?

## 2021-05-27 NOTE — Plan of Care (Signed)

## 2021-05-27 NOTE — Progress Notes (Signed)
?PROGRESS NOTE ? ? ? ?Justin Yang  [id number] DOB: [birth year] DOA: 05/19/2021 ?PCP: Patient, No Pcp Per (Inactive)  ? ?Brief Narrative: 66 year old male with small bowel obstruction.  He has a history of type 2 diabetes hyperlipidemia and a history of small bowel obstruction requiring lysis of adhesions in 2017. ? ?Assessment & Plan: ?  ?Principal Problem: ?  Small bowel obstruction ([place]) ?Active Problems: ?  Hypokalemia ?  Acute kidney injury ([place]) ?  Type 2 diabetes mellitus ([place]) ?  Microcytic anemia ?  Hyperlipidemia associated with type 2 diabetes mellitus ([place]) ? ?#1 small bowel obstruction-status post laparotomy with lysis of adhesions and small bowel resection. ? Had some flatus. ?Surgery following and recommends to continue NG tube and TPN. ?Oob  ?Increase activity ? ?#2 AKI creatinine 1.65 continue IV fluids ? ?#3 type 2 diabetes continue SSI hold metformin ? ?#4 hyperlipidemia holding statin while n.p.o. ? ?#5 hypokalemia repleted ? ?Nutrition Problem: Inadequate oral intake ?Etiology: inability to eat ? ?Signs/Symptoms: NPO status ? ? ?Interventions: Tube feeding, Refer to RD note for recommendations ? ?Estimated body mass index is 21.83 kg/m? as calculated from the following: ?  Height as of this encounter: 5\' 11"  (1.803 m). ?  Weight as of this encounter: 71 kg. ? ?DVT prophylaxis: Heparin  ?code Status: Full code  ?family Communication: None at bedside  ?disposition Plan:  Status is: Inpatient ?Remains inpatient appropriate because: sbo ?  ?Consultants:  ?surgery ? ?Procedures: Exploratory laparotomy and lysis of adhesions and small bowel resection ?Antimicrobials:none ? ?Subjective:resting in bed ?Had a rough night ?Ambulated yesterday ?Had some flatus ?But complains of increased pain in his abdomen ?NG tube with a lot of suction noted ? ?Objective: ?Vitals:  ? 05/26/21 2048 05/27/21 0018 05/27/21 0402 05/27/21 [id number]  ?BP: 104/70  120/64 105/66  ?Pulse: 90  83 88  ?Resp: 18  18 20   ?Temp: 97.8 ?F  (36.6 ?C) 98.6 ?F (37 ?C) 98.3 ?F (36.8 ?C) 99.4 ?F (37.4 ?C)  ?TempSrc: Oral Oral Oral Oral  ?SpO2: 99%  99% 97%  ?Weight:      ?Height:      ? ? ?Intake/Output Summary (Last 24 hours) at 05/27/2021 1324 ?Last data filed at 05/27/2021 1049 ?Gross per 24 hour  ?Intake 2406.65 ml  ?Output 4450 ml  ?Net -2043.35 ml  ? ? ?Filed Weights  ? 05/20/21 1643 05/20/21 1949 05/25/21 1127  ?Weight: 70.8 kg 72.3 kg 71 kg  ? ? ?Examination: ? ?General exam: Appears in mild distress ?Respiratory system: Clear to auscultation. Respiratory effort normal. ?Cardiovascular system: S1 & S2 heard, RRR. No JVD, murmurs, rubs, gallops or clicks. No pedal edema. ?Gastrointestinal system: Abdomen is distended, soft and tender. No organomegaly or masses felt.decreased bowel sounds heard. ?Bloody drainage noted at the lower part of his incision ?Central nervous system: Alert and oriented. No focal neurological deficits. ?Extremities: Symmetric 5 x 5 power. ?Skin: No rashes, lesions or ulcers ?Psychiatry: Judgement and insight appear normal. Mood & affect appropriate.  ? ? ? ?Data Reviewed: I have personally reviewed following labs and imaging studies ? ?CBC: ?Recent Labs  ?Lab 05/23/21 ?[id number] 05/24/21 ?0351 05/25/21 ?[id number] 05/26/21 ?[id number] 05/27/21 ?0502  ?WBC 5.6 3.5* 10.9* 5.9 3.9*  ?NEUTROABS 4.1 2.2 9.6* 5.3 2.8  ?HGB 7.4* 7.4* 8.4* 7.4* 7.4*  ?HCT 24.2* 25.1* 27.6* 24.7* 25.4*  ?MCV 73.3* 74.3* 75.6* 77.2* 76.7*  ?PLT 253 253 203 192 PLATELET CLUMPS NOTED ON SMEAR, UNABLE TO ESTIMATE  ? ? ?Basic Metabolic Panel: ?  Recent Labs  ?Lab 05/23/21 ?[id number] 05/24/21 ?0351 05/24/21 ?1556 05/25/21 ?[id number] 05/26/21 ?[id number] 05/27/21 ?0502  ?NA 136 138  --  138 137 140  ?K 3.0* 2.9* 3.7 4.0 3.8 3.4*  ?CL 91* 91*  --  97* 101 100  ?CO2 31 35*  --  31 30 32  ?GLUCOSE 93 105*  --  135* 141* 187*  ?BUN 57* 33*  --  29* 27* 28*  ?CREATININE 1.92* 1.59*  --  1.65* 1.40* 1.36*  ?CALCIUM 8.4* 8.2*  --  7.4* 7.9* 8.2*  ?MG 2.9* 2.8*  --  2.3 2.3 2.3  ?PHOS 4.8* 2.6  --  3.8  1.8* 2.4*  ? ? ?GFR: ?Estimated Creatinine Clearance: 52.9 mL/min (A) (by C-G formula based on SCr of 1.36 mg/dL (H)). ?Liver Function Tests: ?Recent Labs  ?Lab 05/23/21 ?[id number] 05/24/21 ?0351 05/25/21 ?[id number] 05/26/21 ?[id number] 05/27/21 ?0502  ?AST 17 13* 17 19 16   ?ALT 10 9 11 12 10   ?ALKPHOS 52 54 44 44 46  ?BILITOT 0.8 0.9 1.0 0.4 0.7  ?PROT 6.6 6.3* 5.6* 5.4* 5.9*  ?ALBUMIN 3.4* 3.2* 2.7* 2.4* 2.4*  ? ? ?No results for input(s): LIPASE, AMYLASE in the last 168 hours. ? ?No results for input(s): AMMONIA in the last 168 hours. ?Coagulation Profile: ?No results for input(s): INR, PROTIME in the last 168 hours. ?Cardiac Enzymes: ?No results for input(s): CKTOTAL, CKMB, CKMBINDEX, TROPONINI in the last 168 hours. ?BNP (last 3 results) ?No results for input(s): PROBNP in the last 8760 hours. ?HbA1C: ?No results for input(s): HGBA1C in the last 72 hours. ?CBG: ?Recent Labs  ?Lab 05/26/21 ?1155 05/26/21 ?1828 05/26/21 ?2350 05/27/21 ?0617 05/27/21 ?1155  ?GLUCAP 124* 142* 142* 206* 194*  ? ? ?Lipid Profile: ?Recent Labs  ?  05/26/21 ?[id number]  ?TRIG 98  ? ? ?Thyroid Function Tests: ?No results for input(s): TSH, T4TOTAL, FREET4, T3FREE, THYROIDAB in the last 72 hours. ?Anemia Panel: ?No results for input(s): VITAMINB12, FOLATE, FERRITIN, TIBC, IRON, RETICCTPCT in the last 72 hours. ?Sepsis Labs: ?No results for input(s): PROCALCITON, LATICACIDVEN in the last 168 hours. ? ?No results found for this or any previous visit (from the past 240 hour(s)).  ? ? ? ? ? ?Radiology Studies: ?No results found. ? ? ? ? ? ?Scheduled Meds: ? Chlorhexidine Gluconate Cloth  6 each Topical Daily  ? heparin  5,000 Units Subcutaneous Q8H  ? insulin aspart  0-9 Units Subcutaneous Q6H  ? sodium chloride flush  10-40 mL Intracatheter Q12H  ? ?Continuous Infusions: ? acetaminophen    ? ferric gluconate (FERRLECIT) IVPB 250 mg (05/27/21 0856)  ? methocarbamol (ROBAXIN) IV    ? TPN ADULT (ION) 90 mL/hr at 05/27/21 0316  ? TPN ADULT (ION)    ? ? ? LOS: 7 days   ? ? ?Time spent: 37 min ? ?Georgette Shell, MD ?05/27/2021, 1:24 PM   ?

## 2021-05-27 NOTE — Progress Notes (Signed)
PHARMACY - TOTAL PARENTERAL NUTRITION CONSULT NOTE  ? ?Indication: Small bowel obstruction ? ?Patient Measurements: ?Height: 5\' 11"  (180.3 cm) ?Weight: 71 kg (156 lb 8.4 oz) ?IBW/kg (Calculated) : 75.3 ?TPN AdjBW (KG): 70.8 ?Body mass index is 21.83 kg/m?. ?Usual Weight: 81.8 kg ? ?Assessment: [name] with PMH of T2DM, HLD, chronic microcytic anemia, h/o SBO requiring lysis of adhesions (2017) presented to the ED on 4/13 for new onset nausea/vomiting starting on 4/12. KUB on admit was c/w high-grade SBO. Pt was made NPO and NGT was placed for decompression. IVF were initiated. Pt failed to respond to conservative measures and underwent surgery on 4/18 - laparostomy with lysis of adhesions and small bowel resection. ? ?Patient was eating normal diet prior to symptom onset. Pt reports his last meal was bacon, egg, and cheese sandwich from a gas station prior to nausea/vomiting starting on 4/12. Pt has been NPO since admission on 4/13. Pharmacy consulted to initiate and manage TPN therapy on 4/19. ? ?Glucose / Insulin: BG <180 except for one high this morning at 206, sSSI (7 units given in last 24hrs) ?Electrolytes: K 3.4, coCa 9.5, Phos 2.4, others wnl ?Renal: SCr trending down to 1.36, BUN 28 ?Hepatic: LFTs wnl, albumin 2.4 ?Intake / Output; MIVF: NGT output: 1600, UOP does not appear to be all charted, LBM 4/20 ?MIVF: none ?GI Imaging: None since surgery on 4/18 ?GI Surgeries / Procedures:  ?4/18 - laparostomy with lysis of adhesions and small bowel resection ? ?Central access: PICC placed 4/19 ?TPN start date: 05/25/21 ? ?Nutritional Goals: ?Goal TPN rate is 90 mL/hr (provides 110 g of protein and 2160 kcals per day) ? ?RD Assessment: ?Estimated Needs ?Total Energy Estimated Needs: 2000-2200 kcal/d ?Total Protein Estimated Needs: 100-110 g/d ?Total Fluid Estimated Needs: >/=2.2 L/d ? ?Current Nutrition:  ?TPN at 90 ml/hr ? ?Plan:  ?Continue TPN at goal rate of 17mL/hr ?KCl 96m IV x 3 runs ?Electrolytes in TPN:  Increase: K to 65 mEq/L, Phos to 25 mmol/L Continue: Na 8mEq/L, Ca 35mEq/L, Mg 12mEq/L, Cl:Ace 1:1. ?Add standard MVI and trace elements to TPN ?Add thiamine to TPN  x 5 days (until 4/23) due to high risk for refeeding syndrome  ?Continue Sensitive q6h SSI. ?Monitor TPN labs daily until stable, then on Mon/Thurs. ? ?5/23, PharmD, BCPS ?Please see amion for complete clinical pharmacist phone list ?05/27/2021,7:10 AM ? ?

## 2021-05-28 ENCOUNTER — Inpatient Hospital Stay (HOSPITAL_COMMUNITY): Payer: No Typology Code available for payment source

## 2021-05-28 DIAGNOSIS — K56609 Unspecified intestinal obstruction, unspecified as to partial versus complete obstruction: Secondary | ICD-10-CM | POA: Diagnosis not present

## 2021-05-28 LAB — TYPE AND SCREEN
ABO/RH(D): O POS
Antibody Screen: NEGATIVE
Unit division: 0
Unit division: 0

## 2021-05-28 LAB — COMPREHENSIVE METABOLIC PANEL
ALT: 11 U/L (ref 0–44)
AST: 15 U/L (ref 15–41)
Albumin: 2.4 g/dL — ABNORMAL LOW (ref 3.5–5.0)
Alkaline Phosphatase: 53 U/L (ref 38–126)
Anion gap: 11 (ref 5–15)
BUN: 30 mg/dL — ABNORMAL HIGH (ref 8–23)
CO2: 38 mmol/L — ABNORMAL HIGH (ref 22–32)
Calcium: 8.6 mg/dL — ABNORMAL LOW (ref 8.9–10.3)
Chloride: 97 mmol/L — ABNORMAL LOW (ref 98–111)
Creatinine, Ser: 1.35 mg/dL — ABNORMAL HIGH (ref 0.61–1.24)
GFR, Estimated: 58 mL/min — ABNORMAL LOW (ref >60)
Glucose, Bld: 216 mg/dL — ABNORMAL HIGH (ref 70–99)
Potassium: 3.6 mmol/L (ref 3.5–5.1)
Sodium: 146 mmol/L — ABNORMAL HIGH (ref 135–145)
Total Bilirubin: 0.6 mg/dL (ref 0.3–1.2)
Total Protein: 6.4 g/dL — ABNORMAL LOW (ref 6.5–8.1)

## 2021-05-28 LAB — CBC WITH DIFFERENTIAL/PLATELET
Abs Immature Granulocytes: 0 10*3/uL (ref 0.00–0.07)
Basophils Absolute: 0 10*3/uL (ref 0.0–0.1)
Basophils Relative: 0 %
Eosinophils Absolute: 0.4 10*3/uL (ref 0.0–0.5)
Eosinophils Relative: 4 %
HCT: 26.2 % — ABNORMAL LOW (ref 39.0–52.0)
Hemoglobin: 7.7 g/dL — ABNORMAL LOW (ref 13.0–17.0)
Lymphocytes Relative: 4 %
Lymphs Abs: 0.4 10*3/uL — ABNORMAL LOW (ref 0.7–4.0)
MCH: 22.6 pg — ABNORMAL LOW (ref 26.0–34.0)
MCHC: 29.4 g/dL — ABNORMAL LOW (ref 30.0–36.0)
MCV: 77.1 fL — ABNORMAL LOW (ref 80.0–100.0)
Monocytes Absolute: 0.5 10*3/uL (ref 0.1–1.0)
Monocytes Relative: 5 %
Neutro Abs: 8.6 10*3/uL — ABNORMAL HIGH (ref 1.7–7.7)
Neutrophils Relative %: 87 %
Platelets: 211 10*3/uL (ref 150–400)
RBC: 3.4 MIL/uL — ABNORMAL LOW (ref 4.22–5.81)
RDW: 19.3 % — ABNORMAL HIGH (ref 11.5–15.5)
WBC: 9.9 10*3/uL (ref 4.0–10.5)
nRBC: 0 % (ref 0.0–0.2)
nRBC: 0 /100 WBC

## 2021-05-28 LAB — GLUCOSE, CAPILLARY
Glucose-Capillary: 207 mg/dL — ABNORMAL HIGH (ref 70–99)
Glucose-Capillary: 210 mg/dL — ABNORMAL HIGH (ref 70–99)
Glucose-Capillary: 212 mg/dL — ABNORMAL HIGH (ref 70–99)
Glucose-Capillary: 251 mg/dL — ABNORMAL HIGH (ref 70–99)

## 2021-05-28 LAB — MAGNESIUM: Magnesium: 2.3 mg/dL (ref 1.7–2.4)

## 2021-05-28 LAB — BPAM RBC
Blood Product Expiration Date: 202305172359
Blood Product Expiration Date: 202305182359
ISSUE DATE / TIME: 202304181212
ISSUE DATE / TIME: 202304181212
Unit Type and Rh: 5100
Unit Type and Rh: 5100

## 2021-05-28 LAB — PHOSPHORUS: Phosphorus: 4.3 mg/dL (ref 2.5–4.6)

## 2021-05-28 MED ORDER — PIPERACILLIN-TAZOBACTAM 3.375 G IVPB
3.3750 g | Freq: Three times a day (TID) | INTRAVENOUS | Status: DC
  Administered 2021-05-28 – 2021-06-03 (×18): 3.375 g via INTRAVENOUS
  Filled 2021-05-28 (×19): qty 50

## 2021-05-28 MED ORDER — ORAL CARE MOUTH RINSE
15.0000 mL | Freq: Two times a day (BID) | OROMUCOSAL | Status: DC
  Administered 2021-05-29 – 2021-06-02 (×8): 15 mL via OROMUCOSAL

## 2021-05-28 MED ORDER — TRAVASOL 10 % IV SOLN
INTRAVENOUS | Status: AC
  Filled 2021-05-28: qty 1101.6

## 2021-05-28 MED ORDER — INSULIN ASPART 100 UNIT/ML IJ SOLN
0.0000 [IU] | Freq: Four times a day (QID) | INTRAMUSCULAR | Status: DC
  Administered 2021-05-28 (×2): 5 [IU] via SUBCUTANEOUS
  Administered 2021-05-29: 8 [IU] via SUBCUTANEOUS
  Administered 2021-05-29 (×2): 3 [IU] via SUBCUTANEOUS
  Administered 2021-05-29: 8 [IU] via SUBCUTANEOUS
  Administered 2021-05-30: 3 [IU] via SUBCUTANEOUS
  Administered 2021-05-30: 2 [IU] via SUBCUTANEOUS
  Administered 2021-05-30: 3 [IU] via SUBCUTANEOUS
  Administered 2021-05-31 (×2): 2 [IU] via SUBCUTANEOUS

## 2021-05-28 MED ORDER — POTASSIUM CHLORIDE 10 MEQ/50ML IV SOLN
10.0000 meq | INTRAVENOUS | Status: AC
  Administered 2021-05-28 (×3): 10 meq via INTRAVENOUS
  Filled 2021-05-28 (×3): qty 50

## 2021-05-28 MED ORDER — CHLORHEXIDINE GLUCONATE 0.12 % MT SOLN
15.0000 mL | Freq: Two times a day (BID) | OROMUCOSAL | Status: DC
  Administered 2021-05-28 – 2021-06-03 (×12): 15 mL via OROMUCOSAL
  Filled 2021-05-28 (×11): qty 15

## 2021-05-28 NOTE — Progress Notes (Signed)
?PROGRESS NOTE ? ? ? ?Justin Yang  ZOX:[id number] DOB: 1955/11/16 DOA: 05/19/2021 ?PCP: Patient, No Pcp Per (Inactive)  ? ?Brief Narrative: 66 year old male with small bowel obstruction.  He has a history of type 2 diabetes hyperlipidemia and a history of small bowel obstruction requiring lysis of adhesions in 2017. ? ?Assessment & Plan: ?  ?Principal Problem: ?  Small bowel obstruction (HCC) ?Active Problems: ?  Hypokalemia ?  Acute kidney injury (HCC) ?  Type 2 diabetes mellitus (HCC) ?  Microcytic anemia ?  Hyperlipidemia associated with type 2 diabetes mellitus (HCC) ? ?#1 small bowel obstruction-status post laparotomy with lysis of adhesions and small bowel resection. ? Had some flatus.  No BM ?Pretty foul-smelling drainage from the lower part of the incision ?Noted patient started on Zosyn ?White count 9.9 from 3.9 ?Surgery following and recommends to continue NG tube and TPN. ?Oob  ?Increase activity ? ?#2 AKI creatinine 1.65 continue IV fluids ? ?#3 type 2 diabetes continue SSI hold metformin ? ?#4 hyperlipidemia holding statin while n.p.o. ? ?#5 hypokalemia repleted ? ?Nutrition Problem: Inadequate oral intake ?Etiology: inability to eat ? ?Signs/Symptoms: NPO status ? ? ?Interventions: Tube feeding, Refer to RD note for recommendations ? ?Estimated body mass index is 21.83 kg/m? as calculated from the following: ?  Height as of this encounter: 5\' 11"  (1.803 m). ?  Weight as of this encounter: 71 kg. ? ?DVT prophylaxis: Heparin  ?code Status: Full code  ?family Communication: None at bedside  ?disposition Plan:  Status is: Inpatient ?Remains inpatient appropriate because: sbo ?  ?Consultants:  ?surgery ? ?Procedures: Exploratory laparotomy and lysis of adhesions and small bowel resection ?Antimicrobials:none ? ?Subjective:resting in bed ? ?Sitting up in recliner complains of increasing abdominal pain very foul-smelling drainage that he himself cannot stand ? ?Objective: ?Vitals:  ? 05/27/21 1645 05/27/21  2037 05/28/21 0537 05/28/21 0918  ?BP: 109/66 113/67 114/75 111/77  ?Pulse: 82 87 84 83  ?Resp: 18 20 17 18   ?Temp: 98.2 ?F (36.8 ?C) 98.9 ?F (37.2 ?C) 99.7 ?F (37.6 ?C) 97.8 ?F (36.6 ?C)  ?TempSrc: Oral Oral Oral   ?SpO2: 95% 92% 93% 95%  ?Weight:      ?Height:      ? ? ?Intake/Output Summary (Last 24 hours) at 05/28/2021 1426 ?Last data filed at 05/28/2021 1224 ?Gross per 24 hour  ?Intake 0 ml  ?Output 5150 ml  ?Net -5150 ml  ? ? ?Filed Weights  ? 05/20/21 1643 05/20/21 1949 05/25/21 1127  ?Weight: 70.8 kg 72.3 kg 71 kg  ? ? ?Examination: ? ?General exam: Appears in mild distress ?Respiratory system: Clear to auscultation. Respiratory effort normal. ?Cardiovascular system: S1 & S2 heard, RRR. No JVD, murmurs, rubs, gallops or clicks. No pedal edema. ?Gastrointestinal system: Abdomen is distended, soft and tender. No organomegaly or masses felt.decreased bowel sounds heard. ?Bloody foul-smelling drainage noted at the lower part of his incision ?Central nervous system: Alert and oriented. No focal neurological deficits. ?Extremities: Symmetric 5 x 5 power. ?Skin: No rashes, lesions or ulcers ?Psychiatry: Judgement and insight appear normal. Mood & affect appropriate.  ? ? ? ?Data Reviewed: I have personally reviewed following labs and imaging studies ? ?CBC: ?Recent Labs  ?Lab 05/24/21 ?0351 05/25/21 ?05/26/21 05/26/21 ?8119 05/27/21 ?0502 05/27/21 ?1536 05/28/21 ?0456  ?WBC 3.5* 10.9* 5.9 3.9*  --  9.9  ?NEUTROABS 2.2 9.6* 5.3 2.8  --  8.6*  ?HGB 7.4* 8.4* 7.4* 7.4*  --  7.7*  ?HCT 25.1* 27.6* 24.7* 25.4*  --  26.2*  ?MCV 74.3* 75.6* 77.2* 76.7*  --  77.1*  ?PLT 253 203 192 PLATELET CLUMPS NOTED ON SMEAR, UNABLE TO ESTIMATE 193 211  ? ? ?Basic Metabolic Panel: ?Recent Labs  ?Lab 05/24/21 ?0351 05/24/21 ?1556 05/25/21 ?1093 05/26/21 ?2355 05/27/21 ?0502 05/28/21 ?0456  ?NA 138  --  138 137 140 146*  ?K 2.9* 3.7 4.0 3.8 3.4* 3.6  ?CL 91*  --  97* 101 100 97*  ?CO2 35*  --  31 30 32 38*  ?GLUCOSE 105*  --  135* 141* 187*  216*  ?BUN 33*  --  29* 27* 28* 30*  ?CREATININE 1.59*  --  1.65* 1.40* 1.36* 1.35*  ?CALCIUM 8.2*  --  7.4* 7.9* 8.2* 8.6*  ?MG 2.8*  --  2.3 2.3 2.3 2.3  ?PHOS 2.6  --  3.8 1.8* 2.4* 4.3  ? ? ?GFR: ?Estimated Creatinine Clearance: 53.3 mL/min (A) (by C-G formula based on SCr of 1.35 mg/dL (H)). ?Liver Function Tests: ?Recent Labs  ?Lab 05/24/21 ?0351 05/25/21 ?7322 05/26/21 ?0254 05/27/21 ?0502 05/28/21 ?0456  ?AST 13* 17 19 16 15   ?ALT 9 11 12 10 11   ?ALKPHOS 54 44 44 46 53  ?BILITOT 0.9 1.0 0.4 0.7 0.6  ?PROT 6.3* 5.6* 5.4* 5.9* 6.4*  ?ALBUMIN 3.2* 2.7* 2.4* 2.4* 2.4*  ? ? ?No results for input(s): LIPASE, AMYLASE in the last 168 hours. ? ?No results for input(s): AMMONIA in the last 168 hours. ?Coagulation Profile: ?No results for input(s): INR, PROTIME in the last 168 hours. ?Cardiac Enzymes: ?No results for input(s): CKTOTAL, CKMB, CKMBINDEX, TROPONINI in the last 168 hours. ?BNP (last 3 results) ?No results for input(s): PROBNP in the last 8760 hours. ?HbA1C: ?No results for input(s): HGBA1C in the last 72 hours. ?CBG: ?Recent Labs  ?Lab 05/27/21 ?1155 05/27/21 ?1829 05/27/21 ?2345 05/28/21 ?05/29/21 05/28/21 ?1124  ?GLUCAP 194* 194* 204* 207* 210*  ? ? ?Lipid Profile: ?Recent Labs  ?  05/26/21 ?05/30/21  ?TRIG 98  ? ? ?Thyroid Function Tests: ?No results for input(s): TSH, T4TOTAL, FREET4, T3FREE, THYROIDAB in the last 72 hours. ?Anemia Panel: ?No results for input(s): VITAMINB12, FOLATE, FERRITIN, TIBC, IRON, RETICCTPCT in the last 72 hours. ?Sepsis Labs: ?No results for input(s): PROCALCITON, LATICACIDVEN in the last 168 hours. ? ?No results found for this or any previous visit (from the past 240 hour(s)).  ? ? ? ? ? ?Radiology Studies: ?DG Abd 1 View ? ?Result Date: 05/27/2021 ?CLINICAL DATA:  Abdominal pain and distention with emesis. History of small-bowel obstruction. EXAM: ABDOMEN - 1 VIEW COMPARISON:  Abdominal x-ray dated May 24, 2021. FINDINGS: Tip of an enteric tube visualized in the stomach.  Persistently dilated loops of small bowel. Mild small bowel wall thickening in the right lower quadrant. Oral contrast and stool within the colon to the rectum. New midline skin staples in the lower abdomen/pelvis. IMPRESSION: 1. Findings consistent with postoperative ileus or recurrent partial small bowel obstruction. 2. Mild small bowel wall thickening in the right lower quadrant is nonspecific. Consider further evaluation with CT. Electronically Signed   By: 05/29/2021 M.D.   On: 05/27/2021 15:26   ? ? ? ? ? ?Scheduled Meds: ? chlorhexidine  15 mL Mouth Rinse BID  ? Chlorhexidine Gluconate Cloth  6 each Topical Daily  ? heparin  5,000 Units Subcutaneous Q8H  ? insulin aspart  0-15 Units Subcutaneous Q6H  ? mouth rinse  15 mL Mouth Rinse q12n4p  ? sodium chloride flush  10-40 mL  Intracatheter Q12H  ? ?Continuous Infusions: ? ferric gluconate (FERRLECIT) IVPB 250 mg (05/28/21 0908)  ? methocarbamol (ROBAXIN) IV 500 mg (05/28/21 1224)  ? piperacillin-tazobactam (ZOSYN)  IV 3.375 g (05/28/21 1332)  ? TPN ADULT (ION) 90 mL/hr at 05/27/21 1732  ? TPN ADULT (ION)    ? ? ? LOS: 8 days  ? ? ?Time spent: 37 min ? ?Alwyn RenElizabeth G Edmon Magid, MD ?05/28/2021, 2:26 PM   ?

## 2021-05-28 NOTE — Plan of Care (Signed)
?  Problem: Clinical Measurements: ?Goal: Respiratory complications will improve ?Outcome: Progressing ?  ?Problem: Clinical Measurements: ?Goal: Cardiovascular complication will be avoided ?Outcome: Progressing ?  ?Problem: Clinical Measurements: ?Goal: Will remain free from infection ?Outcome: Not Progressing ?  ?Problem: Activity: ?Goal: Risk for activity intolerance will decrease ?Outcome: Not Progressing ?  ?

## 2021-05-28 NOTE — Progress Notes (Signed)
Pharmacy Antibiotic Note ? ?Justin Yang is a 66 y.o. male admitted on 05/19/2021 with SBO. Patient is s/p laparotomy and small bowel resection, on TPN since admssion. Patient now has midline wound with noted purulence. Pharmacy has been consulted for Zosyn dosing. ? ?Patient is currently afebrile, WBC WNL, CrCl 53.3  ? ?Plan: ?START Zosyn 3.375 g IV Q8H (ext. Infusion)  ?Monitor renal function, clinical improvement, ability to narrow  ? ?Height: 5\' 11"  (180.3 cm) ?Weight: 71 kg (156 lb 8.4 oz) ?IBW/kg (Calculated) : 75.3 ? ?Temp (24hrs), Avg:98.7 ?F (37.1 ?C), Min:97.8 ?F (36.6 ?C), Max:99.7 ?F (37.6 ?C) ? ?Recent Labs  ?Lab 05/24/21 ?0351 05/25/21 ?05/27/21 05/26/21 ?05/28/21 05/27/21 ?0502 05/28/21 ?0456  ?WBC 3.5* 10.9* 5.9 3.9* 9.9  ?CREATININE 1.59* 1.65* 1.40* 1.36* 1.35*  ?  ?Estimated Creatinine Clearance: 53.3 mL/min (A) (by C-G formula based on SCr of 1.35 mg/dL (H)).   ? ?No Known Allergies ? ?Antimicrobials this admission: ?Zosyn 4/22>> ? ?Dose adjustments this admission: ?N/A  ? ?Microbiology results: ?N/A  ? ?05/30/21, PharmD ?PGY-1 Acute Care Resident  ?05/28/2021 11:32 AM  ? ? ?

## 2021-05-28 NOTE — Progress Notes (Signed)
NG tube measurement correct for current shift ?

## 2021-05-28 NOTE — Progress Notes (Signed)
4 Days Post-Op  ? ?Subjective/Chief Complaint: ?PT with some pain ?Con't with nausea ?Some flatus ? ?Objective: ?Vital signs in last 24 hours: ?Temp:  [97.8 ?F (36.6 ?C)-99.7 ?F (37.6 ?C)] 97.8 ?F (36.6 ?C) (04/22 1937) ?Pulse Rate:  [82-87] 83 (04/22 0918) ?Resp:  [17-20] 18 (04/22 9024) ?BP: (109-114)/(66-77) 111/77 (04/22 0973) ?SpO2:  [92 %-95 %] 95 % (04/22 0918) ?Last BM Date : 05/24/21 ? ?Intake/Output from previous day: ?04/21 0701 - 04/22 0700 ?In: 0  ?Out: 4600 [Urine:700; Emesis/NG output:3900] ?Intake/Output this shift: ?Total I/O ?In: -  ?Out: 650 [Emesis/NG output:650] ? ?PE: ? ?Constitutional: No acute distress, conversant, appears states age. ?Eyes: Anicteric sclerae, moist conjunctiva, no lid lag ?Lungs: Clear to auscultation bilaterally, normal respiratory effort ?CV: regular rate and rhythm, no murmurs, no peripheral edema, pedal pulses 2+ ?GI: foul smelling m/l wound, soft, approp ttp, hypoactive BS ?Skin: No rashes, palpation reveals normal turgor ?Psychiatric: appropriate judgment and insight, oriented to person, place, and time ? ? ? ?Lab Results:  ?Recent Labs  ?  05/27/21 ?0502 05/27/21 ?1536 05/28/21 ?0456  ?WBC 3.9*  --  9.9  ?HGB 7.4*  --  7.7*  ?HCT 25.4*  --  26.2*  ?PLT PLATELET CLUMPS NOTED ON SMEAR, UNABLE TO ESTIMATE 193 211  ? ?BMET ?Recent Labs  ?  05/27/21 ?0502 05/28/21 ?0456  ?NA 140 146*  ?K 3.4* 3.6  ?CL 100 97*  ?CO2 32 38*  ?GLUCOSE 187* 216*  ?BUN 28* 30*  ?CREATININE 1.36* 1.35*  ?CALCIUM 8.2* 8.6*  ? ?PT/INR ?No results for input(s): LABPROT, INR in the last 72 hours. ?ABG ?No results for input(s): PHART, HCO3 in the last 72 hours. ? ?Invalid input(s): PCO2, PO2 ? ?Studies/Results: ?DG Abd 1 View ? ?Result Date: 05/27/2021 ?CLINICAL DATA:  Abdominal pain and distention with emesis. History of small-bowel obstruction. EXAM: ABDOMEN - 1 VIEW COMPARISON:  Abdominal x-ray dated May 24, 2021. FINDINGS: Tip of an enteric tube visualized in the stomach. Persistently dilated  loops of small bowel. Mild small bowel wall thickening in the right lower quadrant. Oral contrast and stool within the colon to the rectum. New midline skin staples in the lower abdomen/pelvis. IMPRESSION: 1. Findings consistent with postoperative ileus or recurrent partial small bowel obstruction. 2. Mild small bowel wall thickening in the right lower quadrant is nonspecific. Consider further evaluation with CT. Electronically Signed   By: Obie Dredge M.D.   On: 05/27/2021 15:26   ? ? ? ? ?Assessment/Plan: ?POD #4 elap/loa/sbr-[place] ?- continue NPO/NGT to LIWS and await return in bowel function. Starting to pass a little flatus but still has high/ thick NG output ?- continue TPN until reliably tolerating a diet ?- continue mobilizing ?- continue scheduled IV tylenol and IV robaxin for pain control  ?- pulm toilet, IS ? ?Lower midline wound infx: DC'd staples and purulence drained.  Will start on zosyn. Will need TID dressing changes  ? ?  ?ID - Zosyn for wound infection ?FEN - IVF, NPO/NGT to LIWS, TPN await bowel fxn ?VTE - sq heparin ?Foley - out and voiding ?  ?ABL anemia - Hgb stable at 7.4, monitor ?AKI - Cr 1.36, improving ?DM ?HLD ? ? LOS: 8 days  ? ? ?Axel Filler ?05/28/2021 ? ?

## 2021-05-28 NOTE — Progress Notes (Signed)
PHARMACY - TOTAL PARENTERAL NUTRITION CONSULT NOTE  ? ?Indication: Small bowel obstruction ? ?Patient Measurements: ?Height: 5\' 11"  (180.3 cm) ?Weight: 71 kg (156 lb 8.4 oz) ?IBW/kg (Calculated) : 75.3 ?TPN AdjBW (KG): 70.8 ?Body mass index is 21.83 kg/m?. ?Usual Weight: 81.8 kg ? ?Assessment: [name] with PMH of T2DM, HLD, chronic microcytic anemia, h/o SBO requiring lysis of adhesions (2017) presented to the ED on 4/13 for new onset nausea/vomiting starting on 4/12. KUB on admit was c/w high-grade SBO. Pt was made NPO and NGT was placed for decompression. IVF were initiated. Pt failed to respond to conservative measures and underwent surgery on 4/18 - laparostomy with lysis of adhesions and small bowel resection. ? ?Patient was eating normal diet prior to symptom onset. Pt reports his last meal was bacon, egg, and cheese sandwich from a gas station prior to nausea/vomiting starting on 4/12. Pt has been NPO since admission on 4/13. Pharmacy consulted to initiate and manage TPN therapy on 4/19. ? ?Glucose / Insulin: BG 194-207, sensitive SSI (10 units given in last 24hrs) ?Electrolytes: K 3.6, coCa 9.9, others wnl ?Renal: SCr trending down to 1.35, BUN 30 ?Hepatic: LFTs wnl, albumin 2.4 ?Intake / Output; MIVF: NGT output: 1900, UOP does not appear to be all charted, LBM 4/20. Passing some flatus. ?MIVF: none ?GI Imaging:  ?4/21 abd xray: dilated loops of small bowel c/w post-op ileus or recurrent pSBO ?GI Surgeries / Procedures:  ?4/18 - laparostomy with lysis of adhesions and small bowel resection ? ?Central access: PICC placed 4/19 ?TPN start date: 05/25/21 ? ?Nutritional Goals: ?Goal TPN rate is 90 mL/hr (provides 110 g of protein and 2160 kcals per day) ? ?RD Assessment: ?Estimated Needs ?Total Energy Estimated Needs: 2000-2200 kcal/d ?Total Protein Estimated Needs: 100-110 g/d ?Total Fluid Estimated Needs: >/=2.2 L/d ? ?Current Nutrition:  ?TPN ? ?Plan:  ?Continue TPN at goal rate of 4mL/hr meeting 100% of pt's  estimated needs ?KCl 96m IV x 3 runs ?Electrolytes in TPN: Increase: K to 75 mEq/L Decrease Phos to 15 mmol/L Continue: Na 26mEq/L, Ca 69mEq/L, Mg 68mEq/L, Cl:Ace 1:1. ?Add standard MVI and trace elements to TPN ?Add thiamine to TPN  x 5 days (until 4/23) due to high risk for refeeding syndrome  ?Change to moderate SSI. May need to add insulin to TPN tomorrow. ?Monitor TPN labs daily until stable, then on Mon/Thurs. ? ?5/23, PharmD, BCPS ?Please see amion for complete clinical pharmacist phone list ?05/28/2021,7:35 AM ? ?

## 2021-05-28 NOTE — Progress Notes (Signed)
?   05/28/21 1725  ?Assess: MEWS Score  ?Temp 99.5 ?F (37.5 ?C)  ?BP 96/73  ?Pulse Rate (!) 108  ?Resp 16  ?SpO2 92 %  ?O2 Device Room Air  ?Assess: MEWS Score  ?MEWS Temp 0  ?MEWS Systolic 1  ?MEWS Pulse 1  ?MEWS RR 0  ?MEWS LOC 0  ?MEWS Score 2  ?MEWS Score Color Yellow  ?Notify: Charge Nurse/RN  ?Name of Charge Nurse/RN Notified Gwenlyn Perking RN  ?Date Charge Nurse/RN Notified 05/28/21  ?Time Charge Nurse/RN Notified 1730  ?Notify: Provider  ?Provider Name/Title Rodena Piety MD  ?Date Provider Notified 05/28/21  ?Time Provider Notified 1728  ?Notification Type Page  ?Notification Reason Other (Comment) ?(vital put in yellow mews)  ?Provider response No new orders;Other (Comment) ?(continue to monitor)  ?Date of Provider Response 05/28/21  ?Time of Provider Response 1730  ?Document  ?Patient Outcome Other (Comment) ?(continue to monitor)  ?Progress note created (see row info) Yes  ? ? ?

## 2021-05-28 NOTE — Plan of Care (Signed)

## 2021-05-29 DIAGNOSIS — K56609 Unspecified intestinal obstruction, unspecified as to partial versus complete obstruction: Secondary | ICD-10-CM | POA: Diagnosis not present

## 2021-05-29 LAB — CBC WITH DIFFERENTIAL/PLATELET
Abs Immature Granulocytes: 0.23 10*3/uL — ABNORMAL HIGH (ref 0.00–0.07)
Basophils Absolute: 0 10*3/uL (ref 0.0–0.1)
Basophils Relative: 0 %
Eosinophils Absolute: 0.1 10*3/uL (ref 0.0–0.5)
Eosinophils Relative: 0 %
HCT: 29 % — ABNORMAL LOW (ref 39.0–52.0)
Hemoglobin: 8.6 g/dL — ABNORMAL LOW (ref 13.0–17.0)
Immature Granulocytes: 2 %
Lymphocytes Relative: 6 %
Lymphs Abs: 0.7 10*3/uL (ref 0.7–4.0)
MCH: 22.6 pg — ABNORMAL LOW (ref 26.0–34.0)
MCHC: 29.7 g/dL — ABNORMAL LOW (ref 30.0–36.0)
MCV: 76.1 fL — ABNORMAL LOW (ref 80.0–100.0)
Monocytes Absolute: 1.7 10*3/uL — ABNORMAL HIGH (ref 0.1–1.0)
Monocytes Relative: 14 %
Neutro Abs: 9.3 10*3/uL — ABNORMAL HIGH (ref 1.7–7.7)
Neutrophils Relative %: 78 %
Platelets: 228 10*3/uL (ref 150–400)
RBC: 3.81 MIL/uL — ABNORMAL LOW (ref 4.22–5.81)
RDW: 19.5 % — ABNORMAL HIGH (ref 11.5–15.5)
WBC: 12 10*3/uL — ABNORMAL HIGH (ref 4.0–10.5)
nRBC: 0.2 % (ref 0.0–0.2)

## 2021-05-29 LAB — BASIC METABOLIC PANEL
BUN: 46 mg/dL — ABNORMAL HIGH (ref 8–23)
CO2: >45 mmol/L — ABNORMAL HIGH (ref 22–32)
Calcium: 9.1 mg/dL (ref 8.9–10.3)
Chloride: 91 mmol/L — ABNORMAL LOW (ref 98–111)
Creatinine, Ser: 1.7 mg/dL — ABNORMAL HIGH (ref 0.61–1.24)
GFR, Estimated: 44 mL/min — ABNORMAL LOW (ref >60)
Glucose, Bld: 195 mg/dL — ABNORMAL HIGH (ref 70–99)
Potassium: 3.7 mmol/L (ref 3.5–5.1)
Sodium: 150 mmol/L — ABNORMAL HIGH (ref 135–145)

## 2021-05-29 LAB — GLUCOSE, CAPILLARY
Glucose-Capillary: 191 mg/dL — ABNORMAL HIGH (ref 70–99)
Glucose-Capillary: 256 mg/dL — ABNORMAL HIGH (ref 70–99)
Glucose-Capillary: 218 mg/dL — ABNORMAL HIGH (ref 70–99)
Glucose-Capillary: 190 mg/dL — ABNORMAL HIGH (ref 70–99)

## 2021-05-29 MED ORDER — BISACODYL 10 MG RE SUPP
10.0000 mg | Freq: Every day | RECTAL | Status: DC | PRN
  Administered 2021-06-01: 10 mg via RECTAL
  Filled 2021-05-29 (×2): qty 1

## 2021-05-29 MED ORDER — BISACODYL 10 MG RE SUPP
10.0000 mg | Freq: Once | RECTAL | Status: AC
  Administered 2021-05-29: 10 mg via RECTAL
  Filled 2021-05-29: qty 1

## 2021-05-29 MED ORDER — DEXTROSE 5 % IV SOLN: INTRAVENOUS | Status: DC

## 2021-05-29 MED ORDER — POTASSIUM CHLORIDE 10 MEQ/50ML IV SOLN
10.0000 meq | INTRAVENOUS | Status: AC
  Administered 2021-05-29 (×4): 10 meq via INTRAVENOUS
  Filled 2021-05-29 (×4): qty 50

## 2021-05-29 MED ORDER — TRAVASOL 10 % IV SOLN
INTRAVENOUS | Status: AC
  Filled 2021-05-29: qty 1101.6

## 2021-05-29 MED ORDER — TRAVASOL 10 % IV SOLN: INTRAVENOUS | Status: DC

## 2021-05-29 NOTE — Progress Notes (Addendum)
5 Days Post-Op  ? ?Subjective/Chief Complaint: ?PT doing well this AM ?Min nausea ? ? ?Objective: ?Vital signs in last 24 hours: ?Temp:  [98.2 ?F (36.8 ?C)-99.5 ?F (37.5 ?C)] 98.5 ?F (36.9 ?C) (04/23 0617) ?Pulse Rate:  [85-108] 85 (04/23 0617) ?Resp:  [16-18] 18 (04/23 0617) ?BP: (96-118)/(71-79) 107/77 (04/23 0617) ?SpO2:  [92 %-100 %] 100 % (04/23 0617) ?Weight:  [56.1 kg] 56.1 kg (04/23 0617) ?Last BM Date : 05/24/21 ? ?Intake/Output from previous day: ?04/22 0701 - 04/23 0700 ?In: 4091.7 [P.O.:500; I.V.:2766.2; IV Piggyback:825.5] ?Out: [id number] [Urine:400; Emesis/NG output:4250] ?Intake/Output this shift: ?Total I/O ?In: 240 [P.O.:240] ?Out: 900 [Emesis/NG output:900] ? ?General appearance: alert and cooperative ?GI: soft, non-tender; bowel sounds normal; no masses,  no organomegaly and purulent drainage ? ?Lab Results:  ?Recent Labs  ?  05/28/21 ?0456 05/29/21 ?[id number]  ?WBC 9.9 12.0*  ?HGB 7.7* 8.6*  ?HCT 26.2* 29.0*  ?PLT 211 228  ? ?BMET ?Recent Labs  ?  05/28/21 ?0456 05/29/21 ?0455  ?NA 146* 150*  ?K 3.6 3.7  ?CL 97* 91*  ?CO2 38* >45*  ?GLUCOSE 216* 195*  ?BUN 30* 46*  ?CREATININE 1.35* 1.70*  ?CALCIUM 8.6* 9.1  ? ?PT/INR ?No results for input(s): LABPROT, INR in the last 72 hours. ?ABG ?No results for input(s): PHART, HCO3 in the last 72 hours. ? ?Invalid input(s): PCO2, PO2 ? ?Studies/Results: ?DG Abd 1 View ? ?Result Date: 05/27/2021 ?CLINICAL DATA:  Abdominal pain and distention with emesis. History of small-bowel obstruction. EXAM: ABDOMEN - 1 VIEW COMPARISON:  Abdominal x-ray dated May 24, 2021. FINDINGS: Tip of an enteric tube visualized in the stomach. Persistently dilated loops of small bowel. Mild small bowel wall thickening in the right lower quadrant. Oral contrast and stool within the colon to the rectum. New midline skin staples in the lower abdomen/pelvis. IMPRESSION: 1. Findings consistent with postoperative ileus or recurrent partial small bowel obstruction. 2. Mild small bowel wall  thickening in the right lower quadrant is nonspecific. Consider further evaluation with CT. Electronically Signed   By: Titus Dubin M.D.   On: 05/27/2021 15:26  ? ?DG CHEST PORT 1 VIEW ? ?Result Date: 05/28/2021 ?CLINICAL DATA:  Shortness of breath and vomiting. EXAM: PORTABLE CHEST 1 VIEW COMPARISON:  05/20/2021 FINDINGS: The cardiomediastinal silhouette is unremarkable. An enteric tube is noted entering the stomach with tip off the field of view. A RIGHT PICC line is present with tip overlying the SUPERIOR cavoatrial junction. Mild LEFT basilar atelectasis/scarring again noted. There is no evidence of focal airspace disease, pulmonary edema, suspicious pulmonary nodule/mass, pleural effusion, or pneumothorax. No acute bony abnormalities are identified. IMPRESSION: Support apparatus as described. Mild LEFT basilar atelectasis/scarring. Electronically Signed   By: Margarette Canada M.D.   On: 05/28/2021 18:48   ? ?Anti-infectives: ?Anti-infectives (From admission, onward)  ? ? Start     Dose/Rate Route Frequency Ordered Stop  ? 05/28/21 1215  piperacillin-tazobactam (ZOSYN) IVPB 3.375 g       ? 3.375 g ?12.5 mL/hr over 240 Minutes Intravenous Every 8 hours 05/28/21 1125    ? ?  ? ? ?Assessment/Plan: ?POD #5 elap/loa/sbr-[place] ?- will clamp NGT today, DC if tol clamping ?- continue TPN until reliably tolerating a diet ?- continue mobilizing ?- continue scheduled IV tylenol and IV robaxin for pain control  ?- pulm toilet, IS ?  ?Lower midline wound infx: DC'd staples and purulence drained.  Will start on zosyn. Will need TID dressing changes  ?  ?  ?ID -  Zosyn for wound infection ?FEN - IVF, NPO/NGT to LIWS, TPN await bowel fxn ?VTE - sq heparin ?Foley - out and voiding ?  ?ABL anemia - Hgb stable at 7.4, monitor ?AKI - Cr 1.36, improving ?DM ?HLD ? ? ? LOS: 9 days  ? ? ?Justin Yang ?05/29/2021 ? ?

## 2021-05-29 NOTE — Plan of Care (Signed)
°  Problem: Clinical Measurements: °Goal: Cardiovascular complication will be avoided °Outcome: Progressing °  °Problem: Activity: °Goal: Risk for activity intolerance will decrease °Outcome: Progressing °  °

## 2021-05-29 NOTE — Progress Notes (Addendum)
?PROGRESS NOTE ? ? ? ?Justin Yang  ZOX:[id number] DOB: 01/10/1956 DOA: 05/19/2021 ?PCP: Patient, No Pcp Per (Inactive)  ? ?Brief Narrative: 66 year old male with small bowel obstruction.  He has a history of type 2 diabetes hyperlipidemia and a history of small bowel obstruction requiring lysis of adhesions in 2017. ? ?Assessment & Plan: ?  ?Principal Problem: ?  Small bowel obstruction (HCC) ?Active Problems: ?  Hypokalemia ?  Acute kidney injury (HCC) ?  Type 2 diabetes mellitus (HCC) ?  Microcytic anemia ?  Hyperlipidemia associated with type 2 diabetes mellitus (HCC) ? ?#1 small bowel obstruction-status post laparotomy with lysis of adhesions and small bowel resection. ? Had some flatus.  No BM ?foul-smelling drainage from the lower part of the incision has decreased. ?Noted patient started on Zosyn on 05/28/2021 ?White count 12 from 9.9 from 3.9 (partly due to dehydration) ?Surgery following and recommending to clamp NG tube ?PT consult increase activity patient very weak today has been walking till yesterday. ? ?#2 AKI creatinine 1.70 trending up started D5 100 cc an hour  ? ?#3 type 2 diabetes continue SSI hold metformin ?CBG (last 3)  ?Recent Labs  ?  05/28/21 ?2352 05/29/21 ?0618 05/29/21 ?1119  ?GLUCAP 251* 191* 256*  ? ?Continue SSI and insulin in TPN ? ?#4 hyperlipidemia holding statin while n.p.o. ? ?#5 hypokalemia repleted ? ?#6 hypernatremia start D5 W Daily labs ? ?Nutrition Problem: Inadequate oral intake ?Etiology: inability to eat ? ?Signs/Symptoms: NPO status ? ? ?Interventions: Tube feeding, Refer to RD note for recommendations ? ?Estimated body mass index is 17.25 kg/m? as calculated from the following: ?  Height as of this encounter: 5\' 11"  (1.803 m). ?  Weight as of this encounter: 56.1 kg. ? ?DVT prophylaxis: Heparin  ?code Status: Full code  ?family Communication: Discussed with wife at bedside disposition Plan:  Status is: Inpatient ?Remains inpatient appropriate because: sbo ?   ?Consultants:  ?surgery ? ?Procedures: Exploratory laparotomy and lysis of adhesions and small bowel resection ?Antimicrobials Zosyn started 05/28/2021 ?Subjective:resting in bed ? ?Patient more weaker family concerned not had a BM however has flatus ?Continues to have drainage from the surgical incision however the foul smell has decreased since starting Zosyn on 05/28/2021 ?Chest x-ray 05/28/2021 with atelectasis encourage patient and family to use incentive spirometer ? ?Objective: ?Vitals:  ? 05/28/21 2153 05/29/21 0154 05/29/21 0617 05/29/21 1011  ?BP: 118/71 106/77 107/77 104/72  ?Pulse: 95 86 85 85  ?Resp: 18 18 18 16   ?Temp: 98.2 ?F (36.8 ?C) 98.6 ?F (37 ?C) 98.5 ?F (36.9 ?C) 98.3 ?F (36.8 ?C)  ?TempSrc: Oral Oral Oral Oral  ?SpO2: 97% 100% 100% 100%  ?Weight:   56.1 kg   ?Height:      ? ? ?Intake/Output Summary (Last 24 hours) at 05/29/2021 1133 ?Last data filed at 05/29/2021 0800 ?Gross per 24 hour  ?Intake 4331.73 ml  ?Output 4700 ml  ?Net -368.27 ml  ? ? ?Filed Weights  ? 05/20/21 1949 05/25/21 1127 05/29/21 0617  ?Weight: 72.3 kg 71 kg 56.1 kg  ? ? ?Examination: ? ?General exam: Appears in mild distress, weak ill appearing ?Respiratory system: Clear to auscultation. Respiratory effort normal. ?Cardiovascular system: S1 & S2 heard, RRR. No JVD, murmurs, rubs, gallops or clicks. No pedal edema. ?Gastrointestinal system: Abdomen is distended, soft and tender. No organomegaly or masses felt.decreased bowel sounds heard. ?Bloody drainage noted at the lower part of his incision ?Central nervous system: Alert and oriented. No focal neurological deficits. ?  Extremities: Symmetric 5 x 5 power. ?Skin: No rashes, lesions or ulcers ?Psychiatry: Judgement and insight appear normal. Mood & affect appropriate.  ? ? ? ?Data Reviewed: I have personally reviewed following labs and imaging studies ? ?CBC: ?Recent Labs  ?Lab 05/25/21 ?5329 05/26/21 ?9242 05/27/21 ?0502 05/27/21 ?1536 05/28/21 ?0456 05/29/21 ?0455  ?WBC 10.9*  5.9 3.9*  --  9.9 12.0*  ?NEUTROABS 9.6* 5.3 2.8  --  8.6* 9.3*  ?HGB 8.4* 7.4* 7.4*  --  7.7* 8.6*  ?HCT 27.6* 24.7* 25.4*  --  26.2* 29.0*  ?MCV 75.6* 77.2* 76.7*  --  77.1* 76.1*  ?PLT 203 192 PLATELET CLUMPS NOTED ON SMEAR, UNABLE TO ESTIMATE 193 211 228  ? ? ?Basic Metabolic Panel: ?Recent Labs  ?Lab 05/24/21 ?0351 05/24/21 ?1556 05/25/21 ?6834 05/26/21 ?1962 05/27/21 ?0502 05/28/21 ?2297 05/29/21 ?0455  ?NA 138  --  138 137 140 146* 150*  ?K 2.9*   < > 4.0 3.8 3.4* 3.6 3.7  ?CL 91*  --  97* 101 100 97* 91*  ?CO2 35*  --  31 30 32 38* >45*  ?GLUCOSE 105*  --  135* 141* 187* 216* 195*  ?BUN 33*  --  29* 27* 28* 30* 46*  ?CREATININE 1.59*  --  1.65* 1.40* 1.36* 1.35* 1.70*  ?CALCIUM 8.2*  --  7.4* 7.9* 8.2* 8.6* 9.1  ?MG 2.8*  --  2.3 2.3 2.3 2.3  --   ?PHOS 2.6  --  3.8 1.8* 2.4* 4.3  --   ? < > = values in this interval not displayed.  ? ? ?GFR: ?Estimated Creatinine Clearance: 33.5 mL/min (A) (by C-G formula based on SCr of 1.7 mg/dL (H)). ?Liver Function Tests: ?Recent Labs  ?Lab 05/24/21 ?0351 05/25/21 ?9892 05/26/21 ?1194 05/27/21 ?0502 05/28/21 ?0456  ?AST 13* 17 19 16 15   ?ALT 9 11 12 10 11   ?ALKPHOS 54 44 44 46 53  ?BILITOT 0.9 1.0 0.4 0.7 0.6  ?PROT 6.3* 5.6* 5.4* 5.9* 6.4*  ?ALBUMIN 3.2* 2.7* 2.4* 2.4* 2.4*  ? ? ?No results for input(s): LIPASE, AMYLASE in the last 168 hours. ? ?No results for input(s): AMMONIA in the last 168 hours. ?Coagulation Profile: ?No results for input(s): INR, PROTIME in the last 168 hours. ?Cardiac Enzymes: ?No results for input(s): CKTOTAL, CKMB, CKMBINDEX, TROPONINI in the last 168 hours. ?BNP (last 3 results) ?No results for input(s): PROBNP in the last 8760 hours. ?HbA1C: ?No results for input(s): HGBA1C in the last 72 hours. ?CBG: ?Recent Labs  ?Lab 05/28/21 ?1124 05/28/21 ?1737 05/28/21 ?2352 05/29/21 ?0618 05/29/21 ?1119  ?GLUCAP 210* 212* 251* 191* 256*  ? ? ?Lipid Profile: ?No results for input(s): CHOL, HDL, LDLCALC, TRIG, CHOLHDL, LDLDIRECT in the last 72  hours. ? ?Thyroid Function Tests: ?No results for input(s): TSH, T4TOTAL, FREET4, T3FREE, THYROIDAB in the last 72 hours. ?Anemia Panel: ?No results for input(s): VITAMINB12, FOLATE, FERRITIN, TIBC, IRON, RETICCTPCT in the last 72 hours. ?Sepsis Labs: ?No results for input(s): PROCALCITON, LATICACIDVEN in the last 168 hours. ? ?No results found for this or any previous visit (from the past 240 hour(s)).  ? ? ? ? ? ?Radiology Studies: ?DG Abd 1 View ? ?Result Date: 05/27/2021 ?CLINICAL DATA:  Abdominal pain and distention with emesis. History of small-bowel obstruction. EXAM: ABDOMEN - 1 VIEW COMPARISON:  Abdominal x-ray dated May 24, 2021. FINDINGS: Tip of an enteric tube visualized in the stomach. Persistently dilated loops of small bowel. Mild small bowel wall thickening in the right  lower quadrant. Oral contrast and stool within the colon to the rectum. New midline skin staples in the lower abdomen/pelvis. IMPRESSION: 1. Findings consistent with postoperative ileus or recurrent partial small bowel obstruction. 2. Mild small bowel wall thickening in the right lower quadrant is nonspecific. Consider further evaluation with CT. Electronically Signed   By: Obie Dredge M.D.   On: 05/27/2021 15:26  ? ?DG CHEST PORT 1 VIEW ? ?Result Date: 05/28/2021 ?CLINICAL DATA:  Shortness of breath and vomiting. EXAM: PORTABLE CHEST 1 VIEW COMPARISON:  05/20/2021 FINDINGS: The cardiomediastinal silhouette is unremarkable. An enteric tube is noted entering the stomach with tip off the field of view. A RIGHT PICC line is present with tip overlying the SUPERIOR cavoatrial junction. Mild LEFT basilar atelectasis/scarring again noted. There is no evidence of focal airspace disease, pulmonary edema, suspicious pulmonary nodule/mass, pleural effusion, or pneumothorax. No acute bony abnormalities are identified. IMPRESSION: Support apparatus as described. Mild LEFT basilar atelectasis/scarring. Electronically Signed   By: Harmon Pier  M.D.   On: 05/28/2021 18:48   ? ? ? ? ? ?Scheduled Meds: ? chlorhexidine  15 mL Mouth Rinse BID  ? Chlorhexidine Gluconate Cloth  6 each Topical Daily  ? heparin  5,000 Units Subcutaneous Q8H  ? insulin aspart  0-15 Units Su

## 2021-05-29 NOTE — Progress Notes (Addendum)
PHARMACY - TOTAL PARENTERAL NUTRITION CONSULT NOTE  ? ?Indication: Small bowel obstruction ? ?Patient Measurements: ?Height: 5\' 11"  (180.3 cm) ?Weight: 56.1 kg (123 lb 11.2 oz) ?IBW/kg (Calculated) : 75.3 ?TPN AdjBW (KG): 70.8 ?Body mass index is 17.25 kg/m?. ?Usual Weight: 81.8 kg ? ?Assessment: [name] with PMH of T2DM, HLD, chronic microcytic anemia, h/o SBO requiring lysis of adhesions (2017) presented to the ED on 4/13 for new onset nausea/vomiting starting on 4/12. KUB on admit was c/w high-grade SBO. Pt was made NPO and NGT was placed for decompression. IVF were initiated. Pt failed to respond to conservative measures and underwent surgery on 4/18 - laparostomy with lysis of adhesions and small bowel resection. ? ?Patient was eating normal diet prior to symptom onset. Pt reports his last meal was bacon, egg, and cheese sandwich from a gas station prior to nausea/vomiting starting on 4/12. Pt has been NPO since admission on 4/13. Pharmacy consulted to initiate and manage TPN therapy on 4/19. ? ?Glucose / Insulin: BG elevated, moderate SSI (21units given in last 24hrs) ?Electrolytes: Na up to 150 (likely reflective of fluid status), K 3.7, coCa 10.4, bicarb up to >45, Cl 91 ?Renal: SCr up again to 1.7, BUN 46. Also likely due to pt being somewhat dry with so much NGT o/p. ?Hepatic: LFTs wnl, albumin 2.4 ?Intake / Output; MIVF: NGT output increased: 5650 yesterday, UOP does not appear to be all charted. Pt negative 7L past 2 days. LBM 4/18. Passing some flatus. ?MIVF: none currently  - MD starting D5W at 171ml/hr ?GI Imaging:  ?4/21 abd xray: dilated loops of small bowel c/w post-op ileus or recurrent pSBO ?GI Surgeries / Procedures:  ?4/18 - laparostomy with lysis of adhesions and small bowel resection ? ?Central access: PICC placed 4/19 ?TPN start date: 05/25/21 ? ?Nutritional Goals: ?Goal TPN rate is 90 mL/hr (provides 110 g of protein and 2160 kcals per day) ? ?RD Assessment: ?Estimated Needs ?Total Energy  Estimated Needs: 2000-2200 kcal/d ?Total Protein Estimated Needs: 100-110 g/d ?Total Fluid Estimated Needs: >/=2.2 L/d ? ?Current Nutrition:  ?TPN ? ?Plan:  ?Continue TPN at goal rate of 58mL/hr meeting 100% of pt's estimated needs ?KCl 96m IV x 4 runs ?Will discuss adding back MIVF with [facility] - Dr. wants D5W at 188ml/hr ?Electrolytes in TPN: Increase: K to 80 mEq/L (max in TPN). Decrease Na to 20 mEq/L, Ca to 3 mEq/L. Continue Phos 15 mmol/L, Mg 44mEq/L. Change Cl:Ace to max Cl. ?Add standard MVI and trace elements to TPN ?Add thiamine to TPN  x 5 days (until 4/23) due to high risk for refeeding syndrome  ?Continue q6h moderate SSI.  ?Add insulin 14 units to TPN. ?Monitor TPN labs daily until stable, then on Mon/Thurs. ? ?5/23, PharmD, BCPS ?Please see amion for complete clinical pharmacist phone list ?05/29/2021,7:27 AM ? ?

## 2021-05-29 NOTE — Evaluation (Signed)
Occupational Therapy Evaluation ?Patient Details ?Name: Justin Yang ?MRN: [id number] ?DOB: 03-20-1955 ?Today's Date: 05/29/2021 ? ? ?History of Present Illness 66 y/o male presented to ED on 05/19/21 for abdominal cramping and N/V. CT abdomen showed small bowel obstruction. S/p laparotomy with small bowel resection on 4/18. PMH: T2DM, hx of SBO, chronic microcytic anemia.  ? ?Clinical Impression ?  ?Justin Yang was evaluated s/p the above admission list. He is generally indep at baseline and lives with his wife who can assist at d/c. Upon evaluation pt was limited by abdominal pain, decreased activity tolerance, and poor insight to safety. Overall he required min G for transfers and functional ambulation with a RW and up to min A for LB ADLs. Pt will benefit from OT acutely and HHOT at d/c to progress the limitations listed below.  ?   ? ?Recommendations for follow up therapy are one component of a multi-disciplinary discharge planning process, led by the attending physician.  Recommendations may be updated based on patient status, additional functional criteria and insurance authorization.  ? ?Follow Up Recommendations ? Home health OT  ?  ?Assistance Recommended at Discharge Intermittent Supervision/Assistance  ?Patient can return home with the following A little help with walking and/or transfers;A little help with bathing/dressing/bathroom;Direct supervision/assist for medications management;Help with stairs or ramp for entrance;Assist for transportation ? ?  ?Functional Status Assessment ? Patient has had a recent decline in their functional status and demonstrates the ability to make significant improvements in function in a reasonable and predictable amount of time.  ?Equipment Recommendations ? Other (comment);BSC/3in1 (RW)  ?  ?   ?Precautions / Restrictions Precautions ?Precautions: Fall ?Restrictions ?Weight Bearing Restrictions: No  ? ?  ? ?Mobility Bed Mobility ?Overal bed mobility: Needs Assistance ?Bed  Mobility: Rolling, Sidelying to Sit, Sit to Sidelying ?Rolling: Supervision ?Sidelying to sit: Supervision ?  ?  ?Sit to sidelying: Supervision ?General bed mobility comments: verbal cues for log roll for comfort ?  ? ?Transfers ?Overall transfer level: Needs assistance ?Equipment used: Rolling walker (2 wheels) ?Transfers: Sit to/from Stand ?Sit to Stand: Min guard ?  ?  ?  ?  ?  ?General transfer comment: cues for hand placement, initially attempting to pull from teh RW ?  ? ?  ?Balance Overall balance assessment: Needs assistance ?Sitting-balance support: Feet supported ?Sitting balance-Leahy Scale: Fair ?  ?  ?Standing balance support: Single extremity supported, During functional activity ?Standing balance-Leahy Scale: Fair ?  ?  ?  ?  ?  ?  ?  ?  ?  ?  ?  ?  ?   ? ?ADL either performed or assessed with clinical judgement  ? ?ADL Overall ADL's : Needs assistance/impaired ?Eating/Feeding: Independent;Sitting ?  ?Grooming: Min guard;Standing ?  ?Upper Body Bathing: Set up;Sitting ?  ?Lower Body Bathing: Minimal assistance;Sit to/from stand ?  ?Upper Body Dressing : Set up;Sitting ?  ?Lower Body Dressing: Minimal assistance;Sit to/from stand ?  ?Toilet Transfer: Min guard;Ambulation;Rolling walker (2 wheels) ?  ?Toileting- Clothing Manipulation and Hygiene: Supervision/safety;Sitting/lateral lean ?  ?  ?  ?Functional mobility during ADLs: Min guard;Rolling walker (2 wheels) ?General ADL Comments: wife assisting a lot initially, encouraged pt to complete self care as indep as he can. min G for safety. does much better with RW  ? ? ? ?Vision Baseline Vision/History: 0 No visual deficits ?Vision Assessment?: No apparent visual deficits  ?   ?   ?   ? ?Pertinent Vitals/Pain Pain Assessment ?Pain Assessment: Faces ?Faces  Pain Scale: Hurts a little bit ?Pain Location: abdominal incision ?Pain Descriptors / Indicators: Grimacing ?Pain Intervention(s): Limited activity within patient's tolerance, Monitored during session   ? ? ? ?   ?Extremity/Trunk Assessment Upper Extremity Assessment ?Upper Extremity Assessment: Overall WFL for tasks assessed;Generalized weakness ?  ?Lower Extremity Assessment ?Lower Extremity Assessment: Defer to PT evaluation ?  ?Cervical / Trunk Assessment ?Cervical / Trunk Assessment: Other exceptions ?Cervical / Trunk Exceptions: abdominal incision ?  ?Communication Communication ?Communication: No difficulties ?  ?Cognition Arousal/Alertness: Awake/alert ?Behavior During Therapy: [facility] for tasks assessed/performed ?Overall Cognitive Status: Within Functional Limits for tasks assessed ?  ?  ?  ?  ?  ?  ?  ?  ?  ?  ?  ?  ?  ?  ?  ?  ?  ?  ?  ?General Comments  VSS on RA, wife present and supportive ? ?  ?   ?   ? ? ?Home Living Family/patient expects to be discharged to:: Private residence ?Living Arrangements: Spouse/significant other ?Available Help at Discharge: Family ?Type of Home: House ?Home Access: Level entry ?  ?  ?Home Layout: One level ?  ?  ?Bathroom Shower/Tub: Tub/shower unit ?  ?Bathroom Toilet: Standard ?  ?  ?Home Equipment: None ?  ?  ?  ? ?  ?Prior Functioning/Environment Prior Level of Function : Independent/Modified Independent;Working/employed;Driving ?  ?  ?  ?  ?  ?  ?Mobility Comments: no AD ?ADLs Comments: indep ?  ? ?  ?  ?OT Problem List: Decreased strength;Decreased range of motion;Decreased activity tolerance;Impaired balance (sitting and/or standing);Decreased safety awareness;Decreased knowledge of precautions;Pain ?  ?   ?OT Treatment/Interventions: Therapeutic exercise;Self-care/ADL training;Balance training;Therapeutic activities;DME and/or AE instruction  ?  ?OT Goals(Current goals can be found in the care plan section) Acute Rehab OT Goals ?Patient Stated Goal: less pain ?OT Goal Formulation: With patient ?Time For Goal Achievement: 06/12/21 ?Potential to Achieve Goals: Good ?ADL Goals ?Pt Will Perform Grooming: with modified independence;standing ?Pt Will Perform Lower Body  Dressing: with modified independence;sit to/from stand ?Pt Will Transfer to Toilet: with modified independence;ambulating ?Additional ADL Goal #1: Pt will demonstrate increased activity tolerance to complete 3 ADL tasks in standing wtih supervision A  ?OT Frequency: Min 2X/week ?  ? ?   ?AM-PAC OT "6 Clicks" Daily Activity     ?Outcome Measure Help from another person eating meals?: None ?Help from another person taking care of personal grooming?: A Little ?Help from another person toileting, which includes using toliet, bedpan, or urinal?: A Little ?Help from another person bathing (including washing, rinsing, drying)?: A Little ?Help from another person to put on and taking off regular upper body clothing?: None ?Help from another person to put on and taking off regular lower body clothing?: A Little ?6 Click Score: 20 ?  ?End of Session Equipment Utilized During Treatment: Gait belt;Rolling walker (2 wheels) ?Nurse Communication: Mobility status ? ?Activity Tolerance: Patient tolerated treatment well ?Patient left: in bed;with call bell/phone within reach;with bed alarm set;with family/visitor present ? ?OT Visit Diagnosis: Unsteadiness on feet (R26.81);Muscle weakness (generalized) (M62.81);Pain  ?              ?Time: 1610-1630 ?OT Time Calculation (min): 20 min ?Charges:  OT General Charges ?$OT Visit: 1 Visit ?OT Evaluation ?$OT Eval Moderate Complexity: 1 Mod ? ? ?Tieisha Darden A Zell Hylton ?05/29/2021, 6:04 PM ?

## 2021-05-30 DIAGNOSIS — K56609 Unspecified intestinal obstruction, unspecified as to partial versus complete obstruction: Secondary | ICD-10-CM | POA: Diagnosis not present

## 2021-05-30 LAB — CBC WITH DIFFERENTIAL/PLATELET
Abs Immature Granulocytes: 0.78 10*3/uL — ABNORMAL HIGH (ref 0.00–0.07)
Basophils Absolute: 0.1 10*3/uL (ref 0.0–0.1)
Basophils Relative: 0 %
Eosinophils Absolute: 0.1 10*3/uL (ref 0.0–0.5)
Eosinophils Relative: 1 %
HCT: 26.5 % — ABNORMAL LOW (ref 39.0–52.0)
Hemoglobin: 7.7 g/dL — ABNORMAL LOW (ref 13.0–17.0)
Immature Granulocytes: 5 %
Lymphocytes Relative: 10 %
Lymphs Abs: 1.6 10*3/uL (ref 0.7–4.0)
MCH: 22.3 pg — ABNORMAL LOW (ref 26.0–34.0)
MCHC: 29.1 g/dL — ABNORMAL LOW (ref 30.0–36.0)
MCV: 76.6 fL — ABNORMAL LOW (ref 80.0–100.0)
Monocytes Absolute: 1.7 10*3/uL — ABNORMAL HIGH (ref 0.1–1.0)
Monocytes Relative: 11 %
Neutro Abs: 10.9 10*3/uL — ABNORMAL HIGH (ref 1.7–7.7)
Neutrophils Relative %: 73 %
Platelets: 176 10*3/uL (ref 150–400)
RBC: 3.46 MIL/uL — ABNORMAL LOW (ref 4.22–5.81)
RDW: 19.4 % — ABNORMAL HIGH (ref 11.5–15.5)
WBC: 15 10*3/uL — ABNORMAL HIGH (ref 4.0–10.5)
nRBC: 0 % (ref 0.0–0.2)

## 2021-05-30 LAB — COMPREHENSIVE METABOLIC PANEL
ALT: 13 U/L (ref 0–44)
AST: 19 U/L (ref 15–41)
Albumin: 2.3 g/dL — ABNORMAL LOW (ref 3.5–5.0)
Alkaline Phosphatase: 69 U/L (ref 38–126)
Anion gap: 9 (ref 5–15)
BUN: 45 mg/dL — ABNORMAL HIGH (ref 8–23)
CO2: 33 mmol/L — ABNORMAL HIGH (ref 22–32)
Calcium: 8.4 mg/dL — ABNORMAL LOW (ref 8.9–10.3)
Chloride: 94 mmol/L — ABNORMAL LOW (ref 98–111)
Creatinine, Ser: 1.72 mg/dL — ABNORMAL HIGH (ref 0.61–1.24)
GFR, Estimated: 43 mL/min — ABNORMAL LOW (ref >60)
Glucose, Bld: 164 mg/dL — ABNORMAL HIGH (ref 70–99)
Potassium: 4.2 mmol/L (ref 3.5–5.1)
Sodium: 136 mmol/L (ref 135–145)
Total Bilirubin: 0.5 mg/dL (ref 0.3–1.2)
Total Protein: 6.9 g/dL (ref 6.5–8.1)

## 2021-05-30 LAB — PHOSPHORUS: Phosphorus: 3.2 mg/dL (ref 2.5–4.6)

## 2021-05-30 LAB — TRIGLYCERIDES: Triglycerides: 169 mg/dL — ABNORMAL HIGH (ref <150)

## 2021-05-30 LAB — MAGNESIUM: Magnesium: 2.1 mg/dL (ref 1.7–2.4)

## 2021-05-30 LAB — GLUCOSE, CAPILLARY
Glucose-Capillary: 116 mg/dL — ABNORMAL HIGH (ref 70–99)
Glucose-Capillary: 131 mg/dL — ABNORMAL HIGH (ref 70–99)
Glucose-Capillary: 152 mg/dL — ABNORMAL HIGH (ref 70–99)
Glucose-Capillary: 176 mg/dL — ABNORMAL HIGH (ref 70–99)

## 2021-05-30 MED ORDER — IOHEXOL 9 MG/ML PO SOLN
500.0000 mL | ORAL | Status: AC
  Administered 2021-05-30 (×2): 500 mL via ORAL

## 2021-05-30 MED ORDER — IOHEXOL 9 MG/ML PO SOLN
ORAL | Status: AC
  Filled 2021-05-30: qty 1000

## 2021-05-30 MED ORDER — ACETAMINOPHEN 500 MG PO TABS
1000.0000 mg | ORAL_TABLET | Freq: Three times a day (TID) | ORAL | Status: DC
  Administered 2021-05-30 – 2021-06-03 (×10): 1000 mg via ORAL
  Filled 2021-05-30 (×11): qty 2

## 2021-05-30 MED ORDER — GLUCERNA SHAKE PO LIQD
237.0000 mL | Freq: Three times a day (TID) | ORAL | Status: DC
Start: 2021-05-30 — End: 2021-06-03
  Administered 2021-05-30 – 2021-06-03 (×10): 237 mL via ORAL

## 2021-05-30 MED ORDER — METHOCARBAMOL 500 MG PO TABS
500.0000 mg | ORAL_TABLET | Freq: Four times a day (QID) | ORAL | Status: DC
  Administered 2021-05-30 – 2021-06-03 (×14): 500 mg via ORAL
  Filled 2021-05-30 (×16): qty 1

## 2021-05-30 MED ORDER — TRAVASOL 10 % IV SOLN
INTRAVENOUS | Status: DC
  Filled 2021-05-30 (×2): qty 1101.6

## 2021-05-30 MED ORDER — OXYCODONE HCL 5 MG PO TABS
5.0000 mg | ORAL_TABLET | ORAL | Status: DC | PRN
  Administered 2021-05-31 – 2021-06-03 (×11): 10 mg via ORAL
  Filled 2021-05-30 (×4): qty 2
  Filled 2021-05-30: qty 1
  Filled 2021-05-30 (×3): qty 2
  Filled 2021-05-30: qty 1
  Filled 2021-05-30 (×4): qty 2

## 2021-05-30 MED ORDER — TRAVASOL 10 % IV SOLN
INTRAVENOUS | Status: AC
  Filled 2021-05-30: qty 1101.6

## 2021-05-30 MED ORDER — HYDROMORPHONE HCL 1 MG/ML IJ SOLN
0.5000 mg | INTRAMUSCULAR | Status: DC | PRN
  Administered 2021-05-30: 1 mg via INTRAVENOUS
  Administered 2021-05-31: 0.5 mg via INTRAVENOUS
  Administered 2021-05-31 – 2021-06-03 (×9): 1 mg via INTRAVENOUS
  Filled 2021-05-30 (×15): qty 1

## 2021-05-30 NOTE — Progress Notes (Signed)
[facility] ?Progress Note ? ?6 Days Post-Op  ?Subjective: ?CC-  ?Wife at bedside. Doing much better today than last week. NG removed yesterday. He had about 4 Bms yesterday and is passing flatus today. No n/v. Has only had water to drink. ? ?Objective: ?Vital signs in last 24 hours: ?Temp:  [98 ?F (36.7 ?C)-99.8 ?F (37.7 ?C)] 99.5 ?F (37.5 ?C) (04/24 0510) ?Pulse Rate:  [79-87] 79 (04/24 0510) ?Resp:  [16-18] 18 (04/24 0510) ?BP: (95-110)/(70-77) 110/70 (04/24 0510) ?SpO2:  [91 %-100 %] 99 % (04/24 0510) ?Last BM Date : 05/24/21 ? ?Intake/Output from previous day: ?04/23 0701 - 04/24 0700 ?In: 5362.6 [P.O.:360; I.V.:4229.5; IV Piggyback:773.1] ?Out: 2600 [Urine:1700; Emesis/NG output:900] ?Intake/Output this shift: ?No intake/output data recorded. ? ?PE: ?Gen:  Alert, NAD, pleasant ?Abd: soft, mild distension, mild tenderness over incision, +BS, proximal midline cdi with staples present, distal aspect open with purulent drainage on dressing/ no cellulitis ? ?Lab Results:  ?Recent Labs  ?  05/29/21 ?0455 05/30/21 ?0425  ?WBC 12.0* 15.0*  ?HGB 8.6* 7.7*  ?HCT 29.0* 26.5*  ?PLT 228 176  ? ?BMET ?Recent Labs  ?  05/29/21 ?0455 05/30/21 ?0425  ?NA 150* 136  ?K 3.7 4.2  ?CL 91* 94*  ?CO2 >45* 33*  ?GLUCOSE 195* 164*  ?BUN 46* 45*  ?CREATININE 1.70* 1.72*  ?CALCIUM 9.1 8.4*  ? ?PT/INR ?No results for input(s): LABPROT, INR in the last 72 hours. ?CMP  ?   ?Component Value Date/Time  ? NA 136 05/30/2021 0425  ? K 4.2 05/30/2021 0425  ? CL 94 (L) 05/30/2021 0425  ? CO2 33 (H) 05/30/2021 0425  ? GLUCOSE 164 (H) 05/30/2021 0425  ? BUN 45 (H) 05/30/2021 0425  ? CREATININE 1.72 (H) 05/30/2021 0425  ? CALCIUM 8.4 (L) 05/30/2021 0425  ? PROT 6.9 05/30/2021 0425  ? ALBUMIN 2.3 (L) 05/30/2021 0425  ? AST 19 05/30/2021 0425  ? ALT 13 05/30/2021 0425  ? ALKPHOS 69 05/30/2021 0425  ? BILITOT 0.5 05/30/2021 0425  ? GFRNONAA 43 (L) 05/30/2021 0425  ? GFRAA >60 07/06/2015 0458  ? ?Lipase  ?   ?Component Value Date/Time  ?  LIPASE 34 05/20/2021 0225  ? ? ? ? ? ?Studies/Results: ?DG CHEST PORT 1 VIEW ? ?Result Date: 05/28/2021 ?CLINICAL DATA:  Shortness of breath and vomiting. EXAM: PORTABLE CHEST 1 VIEW COMPARISON:  05/20/2021 FINDINGS: The cardiomediastinal silhouette is unremarkable. An enteric tube is noted entering the stomach with tip off the field of view. A RIGHT PICC line is present with tip overlying the SUPERIOR cavoatrial junction. Mild LEFT basilar atelectasis/scarring again noted. There is no evidence of focal airspace disease, pulmonary edema, suspicious pulmonary nodule/mass, pleural effusion, or pneumothorax. No acute bony abnormalities are identified. IMPRESSION: Support apparatus as described. Mild LEFT basilar atelectasis/scarring. Electronically Signed   By: Harmon Pier M.D.   On: 05/28/2021 18:48   ? ?Anti-infectives: ?Anti-infectives (From admission, onward)  ? ? Start     Dose/Rate Route Frequency Ordered Stop  ? 05/28/21 1215  piperacillin-tazobactam (ZOSYN) IVPB 3.375 g       ? 3.375 g ?12.5 mL/hr over 240 Minutes Intravenous Every 8 hours 05/28/21 1125    ? ?  ? ? ? ?Assessment/Plan ?POD #6 elap/loa/sbr-[place] ?- having bowel function. Advance to clear liquid tray, then to full liquids later today if tolerating ?- continue TPN until reliably tolerating a diet ?- continue mobilizing ?- wound partially opened 4/22 and started on zosyn. Continue BID dressing changes ?-  WBC up 15 from 12, monitor. If still going up will consider CT scan tomorrow, but today his wound is looking better and he is having bowel function ?  ?ID - Zosyn for wound infection 4/22>> ?FEN - IVF, CLD>>FLD, TPN  ?VTE - sq heparin ?Foley - out and voiding ?  ?ABL anemia  ?AKI - Cr 1.72, improving ?DM ?HLD ? ? ? LOS: 10 days  ? ? ?Franne Forts, PA-C ?[facility] ?05/30/2021, 10:05 AM ?Please see Amion for pager number during day hours 7:00am-4:30pm ? ?

## 2021-05-30 NOTE — TOC Progression Note (Addendum)
Transition of Care (TOC) - Progression Note  ? ? ?Patient Details  ?Name: Justin Yang ?MRN: [id number] ?Date of Birth: [birth year] ? ?Transition of Care (TOC) CM/SW Contact  ?Tom-Johnson, Hershal Coria, RN ?Phone Number: ?05/30/2021, 3:22 PM ? ?Clinical Narrative:    ? ?. ?  ?  ? ?Expected Discharge Plan and Services ?  ?  ?  ?  ?  ?                ?  ?  ?  ?  ?  ?  ?  ?  ?  ?  ? ? ?Social Determinants of Health (SDOH) Interventions ?  ? ?Readmission Risk Interventions ?   ? View : No data to display.  ?  ?  ?  ? ? ?

## 2021-05-30 NOTE — TOC Progression Note (Signed)
Transition of Care (TOC) - Progression Note  ? ? ?Patient Details  ?Name: JANDRE SALDIERNA ?MRN: [id number] ?Date of Birth: Sep 11, 1955 ? ?Transition of Care (TOC) CM/SW Contact  ?Tom-Johnson, Renea Ee, RN ?Phone Number: ?05/30/2021, 3:32 PM ? ?Clinical Narrative:    ? ?Home health PT/OT recommended. List of agencies from Medicare.gov given to patient and wife at bedside and they chose [facility]. Jen with [facility] notified and acceptance voiced. Info on AVS.  ?Patient has [place] insurance. Cyndee Brightly [phone]) notified of accepting agency.  ?RW and BSC ordered from [place], [place] to deliver at bedside. CM will continue to follow with needs. ? ? ?Expected Discharge Plan: [place] ?Barriers to Discharge: Continued Medical Work up ? ?Expected Discharge Plan and Services ?Expected Discharge Plan: [place] ?  ?Discharge Planning Services: CM Consult ?Post Acute Care Choice: Home Health ?Living arrangements for the past 2 months: [place] ?                ?DME Arranged: 3-N-1, [place] rolling ?DME Agency: [employer] ?Date DME Agency Contacted: 05/30/21 ?Time DME Agency Contacted: [phone] ?Representative spoke with at DME Agency: Delana Meyer ?HH Arranged: PT, OT ?[place] Agency: [facility] ?Date HH Agency Contacted: 05/30/21 ?Time [place]: [id number] spoke with at [place]: Delsa Sale ? ? ?Social Determinants of Health (SDOH) Interventions ?  ? ?Readmission Risk Interventions ?   ? View : No data to display.  ?  ?  ?  ? ? ?

## 2021-05-30 NOTE — Progress Notes (Addendum)
?PROGRESS NOTE ? ? ? ?Justin Yang  [id number] DOB: [birth year] DOA: 05/19/2021 ?PCP: Patient, No Pcp Per (Inactive)  ? ?Brief Narrative: 66 year old male with small bowel obstruction.  He has a history of type 2 diabetes hyperlipidemia and a history of small bowel obstruction requiring lysis of adhesions in 2017. ? ?Assessment & Plan: ?  ?Principal Problem: ?  Small bowel obstruction (HCC) ?Active Problems: ?  Hypokalemia ?  Acute kidney injury (HCC) ?  Type 2 diabetes mellitus (HCC) ?  Microcytic anemia ?  Hyperlipidemia associated with type 2 diabetes mellitus (HCC) ? ?#1 small bowel obstruction-status post laparotomy with lysis of adhesions and small bowel resection. ? Had some flatus.  No BM ?foul-smelling drainage from the lower part of the incision has decreased. ?Noted patient started on Zosyn on 05/28/2021 ?White count 15  from 9.9 from 3.9  ?Surgery following and recommending to clamp NG tube ?PT consult increase activity patient very weak today has been walking till yesterday. ? ?#2 AKI creatinine 1.70 trending up started D5 100 cc an hour  ? ?#3 type 2 diabetes continue SSI hold metformin ?CBG (last 3)  ?Recent Labs  ?  05/30/21 ?0003 05/30/21 ?0601 05/30/21 ?1122  ?GLUCAP 152* 176* 131*  ? ? ?Continue SSI and insulin in TPN ? ?#4 hyperlipidemia holding statin while n.p.o. ? ?#5 hypokalemia repleted ? ?#6 hypernatremia resolved ? ?Nutrition Problem: Inadequate oral intake ?Etiology: inability to eat ? ?Signs/Symptoms: NPO status ? ? ?Interventions: Tube feeding, Refer to RD note for recommendations ? ?Estimated body mass index is 17.25 kg/m? as calculated from the following: ?  Height as of this encounter: 5\' 11"  (1.803 m). ?  Weight as of this encounter: 56.1 kg. ? ?DVT prophylaxis: Heparin  ?code Status: Full code  ?family Communication: Discussed with wife at bedside disposition Plan:  Status is: Inpatient ?Remains inpatient appropriate because: sbo ?  ?Consultants:  ?surgery ? ?Procedures:  Exploratory laparotomy and lysis of adhesions and small bowel resection ?Antimicrobials Zosyn started 05/28/2021 ?Subjective:resting in bed ? ?Continues to complain of abdominal pain not much better than yesterday however NG tube is out he has had flatus and bowel movements continues to have drainage from the lower incision ? ?Objective: ?Vitals:  ? 05/29/21 1011 05/29/21 1700 05/29/21 2034 05/30/21 0510  ?BP: 104/72 110/77 95/77 110/70  ?Pulse: 85 86 87 79  ?Resp: 16 18 18 18   ?Temp: 98.3 ?F (36.8 ?C) 98 ?F (36.7 ?C) 99.8 ?F (37.7 ?C) 99.5 ?F (37.5 ?C)  ?TempSrc: Oral Oral Oral Oral  ?SpO2: 100% 96% 91% 99%  ?Weight:      ?Height:      ? ? ?Intake/Output Summary (Last 24 hours) at 05/30/2021 1228 ?Last data filed at 05/30/2021 0400 ?Gross per 24 hour  ?Intake 4331.41 ml  ?Output 1250 ml  ?Net 3081.41 ml  ? ? ?Filed Weights  ? 05/20/21 1949 05/25/21 1127 05/29/21 0617  ?Weight: 72.3 kg 71 kg 56.1 kg  ? ? ?Examination: ? ?General exam: Appears in mild distress, weak ill appearing ?Respiratory system: Clear to auscultation. Respiratory effort normal. ?Cardiovascular system: S1 & S2 heard, RRR. No JVD, murmurs, rubs, gallops or clicks. No pedal edema. ?Gastrointestinal system: Abdomen is distended, soft and tender. No organomegaly or masses felt.decreased bowel sounds heard. ?Purulent drainage noted at the lower part of his incision ?Central nervous system: Alert and oriented. No focal neurological deficits. ?Extremities: Symmetric 5 x 5 power. ?Skin: No rashes, lesions or ulcers ?Psychiatry: Judgement and insight appear normal. Mood &  affect appropriate.  ? ? ? ?Data Reviewed: I have personally reviewed following labs and imaging studies ? ?CBC: ?Recent Labs  ?Lab 05/26/21 ?[id number] 05/27/21 ?0502 05/27/21 ?1536 05/28/21 ?0456 05/29/21 ?0455 05/30/21 ?0425  ?WBC 5.9 3.9*  --  9.9 12.0* 15.0*  ?NEUTROABS 5.3 2.8  --  8.6* 9.3* 10.9*  ?HGB 7.4* 7.4*  --  7.7* 8.6* 7.7*  ?HCT 24.7* 25.4*  --  26.2* 29.0* 26.5*  ?MCV 77.2* 76.7*   --  77.1* 76.1* 76.6*  ?PLT 192 PLATELET CLUMPS NOTED ON SMEAR, UNABLE TO ESTIMATE 193 211 228 176  ? ? ?Basic Metabolic Panel: ?Recent Labs  ?Lab 05/25/21 ?[id number] 05/26/21 ?[id number] 05/27/21 ?0502 05/28/21 ?[id number] 05/29/21 ?0455 05/30/21 ?0425  ?NA 138 137 140 146* 150* 136  ?K 4.0 3.8 3.4* 3.6 3.7 4.2  ?CL 97* 101 100 97* 91* 94*  ?CO2 31 30 32 38* >45* 33*  ?GLUCOSE 135* 141* 187* 216* 195* 164*  ?BUN 29* 27* 28* 30* 46* 45*  ?CREATININE 1.65* 1.40* 1.36* 1.35* 1.70* 1.72*  ?CALCIUM 7.4* 7.9* 8.2* 8.6* 9.1 8.4*  ?MG 2.3 2.3 2.3 2.3  --  2.1  ?PHOS 3.8 1.8* 2.4* 4.3  --  3.2  ? ? ?GFR: ?Estimated Creatinine Clearance: 33.1 mL/min (A) (by C-G formula based on SCr of 1.72 mg/dL (H)). ?Liver Function Tests: ?Recent Labs  ?Lab 05/25/21 ?[id number] 05/26/21 ?[id number] 05/27/21 ?0502 05/28/21 ?[id number] 05/30/21 ?0425  ?AST 17 19 16 15 19   ?ALT 11 12 10 11 13   ?ALKPHOS 44 44 46 53 69  ?BILITOT 1.0 0.4 0.7 0.6 0.5  ?PROT 5.6* 5.4* 5.9* 6.4* 6.9  ?ALBUMIN 2.7* 2.4* 2.4* 2.4* 2.3*  ? ? ?No results for input(s): LIPASE, AMYLASE in the last 168 hours. ? ?No results for input(s): AMMONIA in the last 168 hours. ?Coagulation Profile: ?No results for input(s): INR, PROTIME in the last 168 hours. ?Cardiac Enzymes: ?No results for input(s): CKTOTAL, CKMB, CKMBINDEX, TROPONINI in the last 168 hours. ?BNP (last 3 results) ?No results for input(s): PROBNP in the last 8760 hours. ?HbA1C: ?No results for input(s): HGBA1C in the last 72 hours. ?CBG: ?Recent Labs  ?Lab 05/29/21 ?1205 05/29/21 ?1658 05/30/21 ?0003 05/30/21 ?0601 05/30/21 ?1122  ?GLUCAP 218* 190* 152* 176* 131*  ? ? ?Lipid Profile: ?Recent Labs  ?  05/30/21 ?0425  ?TRIG 169*  ? ? ?Thyroid Function Tests: ?No results for input(s): TSH, T4TOTAL, FREET4, T3FREE, THYROIDAB in the last 72 hours. ?Anemia Panel: ?No results for input(s): VITAMINB12, FOLATE, FERRITIN, TIBC, IRON, RETICCTPCT in the last 72 hours. ?Sepsis Labs: ?No results for input(s): PROCALCITON, LATICACIDVEN in the last 168  hours. ? ?No results found for this or any previous visit (from the past 240 hour(s)).  ? ? ? ? ? ?Radiology Studies: ?DG CHEST PORT 1 VIEW ? ?Result Date: 05/28/2021 ?CLINICAL DATA:  Shortness of breath and vomiting. EXAM: PORTABLE CHEST 1 VIEW COMPARISON:  05/20/2021 FINDINGS: The cardiomediastinal silhouette is unremarkable. An enteric tube is noted entering the stomach with tip off the field of view. A RIGHT PICC line is present with tip overlying the SUPERIOR cavoatrial junction. Mild LEFT basilar atelectasis/scarring again noted. There is no evidence of focal airspace disease, pulmonary edema, suspicious pulmonary nodule/mass, pleural effusion, or pneumothorax. No acute bony abnormalities are identified. IMPRESSION: Support apparatus as described. Mild LEFT basilar atelectasis/scarring. Electronically Signed   By: Harmon PierJeffrey  Hu M.D.   On: 05/28/2021 18:48   ? ? ? ? ? ?Scheduled Meds: ? acetaminophen  1,000 mg  Oral Q8H  ? chlorhexidine  15 mL Mouth Rinse BID  ? Chlorhexidine Gluconate Cloth  6 each Topical Daily  ? feeding supplement (GLUCERNA SHAKE)  237 mL Oral TID BM  ? heparin  5,000 Units Subcutaneous Q8H  ? insulin aspart  0-15 Units Subcutaneous Q6H  ? mouth rinse  15 mL Mouth Rinse q12n4p  ? methocarbamol  500 mg Oral Q6H  ? sodium chloride flush  10-40 mL Intracatheter Q12H  ? ?Continuous Infusions: ? piperacillin-tazobactam (ZOSYN)  IV 3.375 g (05/30/21 0655)  ? TPN ADULT (ION) 90 mL/hr at 05/29/21 1801  ? TPN ADULT (ION)    ? ? ? LOS: 10 days  ? ? ?Time spent: 37 min ? ?Alwyn Ren, MD ?05/30/2021, 12:28 PM   ?

## 2021-05-30 NOTE — Progress Notes (Addendum)
PHARMACY - TOTAL PARENTERAL NUTRITION CONSULT NOTE  ? ?Indication: Small bowel obstruction ? ?Patient Measurements: ?Height: 5\' 11"  (180.3 cm) ?Weight:  (standing wt refused. pt has been awake most of the night.) ?IBW/kg (Calculated) : 75.3 ?TPN AdjBW (KG): 70.8 ?Body mass index is 17.25 kg/m?. ?Usual Weight: 81.8 kg ? ?Assessment: [name] with PMH of T2DM, HLD, chronic microcytic anemia, h/o SBO requiring lysis of adhesions (2017) presented to the ED on 4/13 for new onset nausea/vomiting starting on 4/12. KUB on admit was c/w high-grade SBO. Pt was made NPO and NGT was placed for decompression. IVF were initiated. Pt failed to respond to conservative measures and underwent surgery on 4/18 - laparotomy with lysis of adhesions and small bowel resection. ? ?Patient was eating normal diet prior to symptom onset. Pt reports his last meal was bacon, egg, and cheese sandwich from a gas station prior to nausea/vomiting starting on 4/12. Pt has been NPO since admission on 4/13. Pharmacy consulted to initiate and manage TPN therapy on 4/19. ? ?Glucose / Insulin: BG 150-218, moderate SSI (17 units given in last 24hrs). Increase likely due to pt on D5W at 100 ml/hr (per provider) due to increased Na yesterday. ?Electrolytes: Na now back down to 136 (150 on 4/23 likely reflective of pt needing more fluid), K 4.2, coCa 9.8, bicarb 33, Cl 94 ?Renal: SCr 1.72, BUN 45. ?Hepatic: LFTs wnl, albumin 2.3. TG trending up to 169. ?Intake / Output; MIVF: NGT down to 1400 on 4/23 -tolerated clamping of NGT and now it has been d/c. UOP 1500. LBM 4/18. Passing some flatus, minimal nausea. ?MIVF: D5W at 158ml/hr ?GI Imaging:  ?4/21 abd xray: dilated loops of small bowel c/w post-op ileus or recurrent pSBO ?GI Surgeries / Procedures:  ?4/18 - laparostomy with lysis of adhesions and small bowel resection ? ?Central access: PICC placed 4/19 ?TPN start date: 05/25/21 ? ?Nutritional Goals: ?Goal TPN rate is 90 mL/hr (provides 110 g of protein and  2160 kcals per day) ? ?RD Assessment: ?Estimated Needs ?Total Energy Estimated Needs: 2000-2200 kcal/d ?Total Protein Estimated Needs: 100-110 g/d ?Total Fluid Estimated Needs: >/=2.2 L/d ? ?Current Nutrition:  ?TPN ? ?Plan:  ?Continue TPN at goal rate of 40mL/hr meeting 100% of pt's estimated needs ?Discuss d/c of D5W with Dr. Rodena Piety -ok to d/c. Since NGT out, TPN alone should meet fluid needs. ?Electrolytes in TPN: Continue Na 20 mEq/L, K 80 mEq/L (max in TPN), Ca to 3 mEq/L, Phos 15 mmol/L, Mg 71mEq/L. Max Chloride. ?Add standard MVI and trace elements to TPN ?Continue q6h moderate SSI. Continue insulin 14 units in TPN. Will not increase further today as D5W playing role in increased CBGs and D5W being d/c. ?BMET in a.m. TPN labs on Mon/Thurs. ?F/u ability to start diet and wean/d/c TPN. ? ?Sherlon Handing, PharmD, BCPS ?Please see amion for complete clinical pharmacist phone list ?05/30/2021,8:08 AM ? ?

## 2021-05-30 NOTE — Evaluation (Signed)
Physical Therapy Re-Evaluation ?Patient Details ?Name: Justin Yang ?MRN: [id number] ?DOB: 1955-02-19 ?Today's Date: 05/30/2021 ? ?History of Present Illness ? 66 y/o male presented to ED on 05/19/21 for abdominal cramping and N/V. CT abdomen showed small bowel obstruction. S/p laparotomy with small bowel resection on 4/18. PMH: T2DM, hx of SBO, chronic microcytic anemia.  ?Clinical Impression ?  ?Pt admitted with above diagnosis. Lives at home with wife, in a single-level home with no steps to enter; Prior to admission, pt was independent; Presents to PT with generalized weakness and decr activity tolerance;  PT evaluated pt last week, and he was walking without difficulty -- since then he had not gotten up, and his current weakness and fatigue are likely due to bedrest; Pt currently with functional limitations due to the deficits listed below (see PT Problem List). Pt will benefit from skilled PT to increase their independence and safety with mobility to allow discharge to the venue listed below.   ?   ? ?Recommend pt ambulate in hallways 2-3x/day with wife assist or staff assist ?   ? ?Recommendations for follow up therapy are one component of a multi-disciplinary discharge planning process, led by the attending physician.  Recommendations may be updated based on patient status, additional functional criteria and insurance authorization. ? ?Follow Up Recommendations Home health PT (as well as HHOT) ? ?  ?Assistance Recommended at Discharge Intermittent Supervision/Assistance  ?Patient can return home with the following ? A little help with walking and/or transfers;Assist for transportation;Assistance with cooking/housework ? ?  ?Equipment Recommendations None recommended by PT  ?Recommendations for Other Services ?    ?  ?Functional Status Assessment Patient has had a recent decline in their functional status and demonstrates the ability to make significant improvements in function in a reasonable and predictable  amount of time.  ? ?  ?Precautions / Restrictions Precautions ?Precautions: Fall ?Precaution Comments: abdominal incision  ? ?  ? ?Mobility ? Bed Mobility ?Overal bed mobility: Needs Assistance ?Bed Mobility: Rolling, Sidelying to Sit ?Rolling: Min guard ?Sidelying to sit: Min guard ?  ?  ?  ?General bed mobility comments: verbal cues for log roll for comfort ?  ? ?Transfers ?Overall transfer level: Needs assistance ?Equipment used: None ?Transfers: Sit to/from Stand ?Sit to Stand: Min guard ?  ?  ?  ?  ?  ?General transfer comment: Cues to self-monitor for activity tolerance; Noting he stabilized himself with UEs holding furnitiure ?  ? ?Ambulation/Gait ?Ambulation/Gait assistance: Min guard (with and without physical contact) ?Gait Distance (Feet): 180 Feet ?Assistive device: IV Pole ?  ?Gait velocity: decreased ?  ?  ?General Gait Details: Weaker after not getting up in almost a week; Close guard for safety; slower gait speed ? ?Stairs ?  ?  ?  ?  ?  ? ?Wheelchair Mobility ?  ? ?Modified Rankin (Stroke Patients Only) ?  ? ?  ? ?Balance   ?  ?Sitting balance-Leahy Scale: Fair ?  ?  ?  ?Standing balance-Leahy Scale: Poor (approaching Fair) ?  ?  ?  ?  ?  ?  ?  ?  ?  ?  ?  ?  ?   ? ? ? ?Pertinent Vitals/Pain Pain Assessment ?Pain Assessment: Faces ?Faces Pain Scale: Hurts a little bit ?Pain Location: abdominal incision ?Pain Descriptors / Indicators: Grimacing ?Pain Intervention(s): Limited activity within patient's tolerance, Monitored during session  ? ? ?Home Living Family/patient expects to be discharged to:: Private residence ?Living Arrangements: Spouse/significant other ?  Available Help at Discharge: Family ?Type of Home: House ?Home Access: Level entry ?  ?  ?  ?Home Layout: One level ?Home Equipment: None ?   ?  ?Prior Function Prior Level of Function : Independent/Modified Independent;Working/employed;Driving ?  ?  ?  ?  ?  ?  ?Mobility Comments: no AD ?ADLs Comments: indep ?  ? ? ?Hand Dominance  ?   ? ?   ?Extremity/Trunk Assessment  ? Upper Extremity Assessment ?Upper Extremity Assessment: Defer to OT evaluation ?  ? ?Lower Extremity Assessment ?Lower Extremity Assessment: Generalized weakness ?  ? ?Cervical / Trunk Assessment ?Cervical / Trunk Assessment: Other exceptions ?Cervical / Trunk Exceptions: abdominal incision  ?Communication  ? Communication: No difficulties  ?Cognition Arousal/Alertness: Awake/alert ?Behavior During Therapy: [facility] for tasks assessed/performed ?Overall Cognitive Status: Within Functional Limits for tasks assessed ?  ?  ?  ?  ?  ?  ?  ?  ?  ?  ?  ?  ?  ?  ?  ?  ?  ?  ?  ? ?  ?General Comments General comments (skin integrity, edema, etc.): Wife present and supportive ? ?  ?Exercises    ? ?Assessment/Plan  ?  ?PT Assessment Patient needs continued PT services  ?PT Problem List Decreased strength;Decreased activity tolerance ? ?   ?  ?PT Treatment Interventions DME instruction;Gait training;Stair training;Functional mobility training;Therapeutic activities;Therapeutic exercise;Patient/family education   ? ?PT Goals (Current goals can be found in the Care Plan section)  ?Acute Rehab PT Goals ?Patient Stated Goal: to go home ?Time For Goal Achievement: 06/13/21 ?Potential to Achieve Goals: Good ? ?  ?Frequency Min 3X/week ?  ? ? ?Co-evaluation   ?  ?  ?  ?  ? ? ?  ?AM-PAC PT "6 Clicks" Mobility  ?Outcome Measure Help needed turning from your back to your side while in a flat bed without using bedrails?: A Little ?Help needed moving from lying on your back to sitting on the side of a flat bed without using bedrails?: A Little ?Help needed moving to and from a bed to a chair (including a wheelchair)?: A Little ?Help needed standing up from a chair using your arms (e.g., wheelchair or bedside chair)?: A Little ?Help needed to walk in hospital room?: A Little ?Help needed climbing 3-5 steps with a railing? : A Little ?6 Click Score: 18 ? ?  ?End of Session   ?Activity Tolerance: Patient tolerated  treatment well ?Patient left: in chair;with call bell/phone within reach;with family/visitor present ?Nurse Communication: Mobility status ?PT Visit Diagnosis: Muscle weakness (generalized) (M62.81) ?  ? ?Time: 9211-9417 ?PT Time Calculation (min) (ACUTE ONLY): 25 min ? ? ?Charges:   PT Evaluation ?$PT Re-evaluation: 1 Re-eval ?PT Treatments ?$Gait Training: 8-22 mins ?  ?   ? ? ?Van Clines, PT  ?Acute Rehabilitation Services ?Office [phone] ? ? ?Levi Aland ?05/30/2021, 12:22 PM ? ?

## 2021-05-31 ENCOUNTER — Inpatient Hospital Stay (HOSPITAL_COMMUNITY): Payer: No Typology Code available for payment source

## 2021-05-31 DIAGNOSIS — K56609 Unspecified intestinal obstruction, unspecified as to partial versus complete obstruction: Secondary | ICD-10-CM | POA: Diagnosis not present

## 2021-05-31 LAB — URINALYSIS, ROUTINE W REFLEX MICROSCOPIC
Bilirubin Urine: NEGATIVE
Glucose, UA: NEGATIVE mg/dL
Ketones, ur: NEGATIVE mg/dL
Nitrite: NEGATIVE
Protein, ur: 100 mg/dL — AB
Specific Gravity, Urine: 1.026 (ref 1.005–1.030)
pH: 6 (ref 5.0–8.0)

## 2021-05-31 LAB — CBC WITH DIFFERENTIAL/PLATELET
Abs Immature Granulocytes: 0 10*3/uL (ref 0.00–0.07)
Basophils Absolute: 0 10*3/uL (ref 0.0–0.1)
Basophils Relative: 0 %
Eosinophils Absolute: 0 10*3/uL (ref 0.0–0.5)
Eosinophils Relative: 0 %
HCT: 24.8 % — ABNORMAL LOW (ref 39.0–52.0)
Hemoglobin: 7.7 g/dL — ABNORMAL LOW (ref 13.0–17.0)
Lymphocytes Relative: 4 %
Lymphs Abs: 0.6 10*3/uL — ABNORMAL LOW (ref 0.7–4.0)
MCH: 23.3 pg — ABNORMAL LOW (ref 26.0–34.0)
MCHC: 31 g/dL (ref 30.0–36.0)
MCV: 74.9 fL — ABNORMAL LOW (ref 80.0–100.0)
Monocytes Absolute: 1.1 10*3/uL — ABNORMAL HIGH (ref 0.1–1.0)
Monocytes Relative: 7 %
Neutro Abs: 13.8 10*3/uL — ABNORMAL HIGH (ref 1.7–7.7)
Neutrophils Relative %: 89 %
Platelets: 199 10*3/uL (ref 150–400)
RBC: 3.31 MIL/uL — ABNORMAL LOW (ref 4.22–5.81)
RDW: 18.8 % — ABNORMAL HIGH (ref 11.5–15.5)
WBC: 15.5 10*3/uL — ABNORMAL HIGH (ref 4.0–10.5)
nRBC: 0 % (ref 0.0–0.2)
nRBC: 0 /100 WBC

## 2021-05-31 LAB — GLUCOSE, CAPILLARY
Glucose-Capillary: 116 mg/dL — ABNORMAL HIGH (ref 70–99)
Glucose-Capillary: 133 mg/dL — ABNORMAL HIGH (ref 70–99)
Glucose-Capillary: 140 mg/dL — ABNORMAL HIGH (ref 70–99)
Glucose-Capillary: 167 mg/dL — ABNORMAL HIGH (ref 70–99)
Glucose-Capillary: 190 mg/dL — ABNORMAL HIGH (ref 70–99)

## 2021-05-31 LAB — BASIC METABOLIC PANEL
Anion gap: 7 (ref 5–15)
BUN: 39 mg/dL — ABNORMAL HIGH (ref 8–23)
CO2: 26 mmol/L (ref 22–32)
Calcium: 8.4 mg/dL — ABNORMAL LOW (ref 8.9–10.3)
Chloride: 100 mmol/L (ref 98–111)
Creatinine, Ser: 1.51 mg/dL — ABNORMAL HIGH (ref 0.61–1.24)
GFR, Estimated: 50 mL/min — ABNORMAL LOW (ref >60)
Glucose, Bld: 111 mg/dL — ABNORMAL HIGH (ref 70–99)
Potassium: 5.1 mmol/L (ref 3.5–5.1)
Sodium: 133 mmol/L — ABNORMAL LOW (ref 135–145)

## 2021-05-31 MED ORDER — PANTOPRAZOLE SODIUM 40 MG IV SOLR
40.0000 mg | INTRAVENOUS | Status: DC
  Administered 2021-05-31 – 2021-06-02 (×3): 40 mg via INTRAVENOUS
  Filled 2021-05-31 (×3): qty 10

## 2021-05-31 MED ORDER — BISACODYL 10 MG RE SUPP
10.0000 mg | Freq: Once | RECTAL | Status: AC
  Administered 2021-05-31: 10 mg via RECTAL
  Filled 2021-05-31: qty 1

## 2021-05-31 MED ORDER — FLEET ENEMA 7-19 GM/118ML RE ENEM
1.0000 | ENEMA | Freq: Once | RECTAL | Status: AC
  Administered 2021-05-31: 1 via RECTAL
  Filled 2021-05-31 (×2): qty 1

## 2021-05-31 MED ORDER — SODIUM CHLORIDE 0.9 % IV SOLN: INTRAVENOUS | Status: DC

## 2021-05-31 MED ORDER — TRAVASOL 10 % IV SOLN
INTRAVENOUS | Status: DC
  Filled 2021-05-31: qty 1101.6

## 2021-05-31 NOTE — Progress Notes (Addendum)
?PROGRESS NOTE ? ? ? ?Justin Yang  [id number] DOB: 08/01/1955 DOA: 05/19/2021 ?PCP: Patient, No Pcp Per (Inactive)  ? ?Brief Narrative: 66 year old male with small bowel obstruction.  He is now status post laparotomy with lysis of adhesions and small bowel resection.  He has a history of type 2 diabetes hyperlipidemia and a history of small bowel obstruction requiring lysis of adhesions in 2017. ? ?Assessment & Plan: ?  ?Principal Problem: ?  Small bowel obstruction ([place]) ?Active Problems: ?  Hypokalemia ?  Acute kidney injury ([place]) ?  Type 2 diabetes mellitus ([place]) ?  Microcytic anemia ?  Hyperlipidemia associated with type 2 diabetes mellitus ([place]) ? ?#1 small bowel obstruction-status post laparotomy with lysis of adhesions and small bowel resection.had few loose BMs overnight after Dulcolax suppository. ?patient started on Zosyn on 05/28/2021 ?White count 15  from 9.9 from 3.9  ?CT ABD 4/25-Postsurgical changes in the anterior abdominal wall with air in ?the subcutaneous soft tissues and a few foci of air in the abdomen ?which are likely related to patient's postoperative status. ?Multiple loops of mildly dilated small bowel in the abdomen with ?no definite transition point, possible ileus versus partial or early ?small bowel obstruction. Surgical consultation is recommended. ?Contrast in the colon with a large amount of stool in the ?rectosigmoid colon with possible impaction ?Surgery following having on clear liquids and enema today. ? ?#2 AKI creatinine 1.51 from 1.70 improving he has received IV fluids on and off. ? ?#3 type 2 diabetes continue SSI hold metformin ?CBG (last 3)  ?Recent Labs  ?  05/31/21 ?[id number] 05/31/21 ?[id number] 05/31/21 ?1135  ?GLUCAP 133* 116* 190*  ? ? ?Continue SSI and insulin in TPN ? ?#4 hyperlipidemia holding statin while n.p.o. ? ?#5 hypokalemia repleted potassium 5.1 ? ?#6 hypernatremia resolved sodium 133 ? ?#7 fever 102.3 overnight blood cultures chest x-ray ?UA essentially negative  for UTI ? ?Nutrition Problem: Inadequate oral intake ?Etiology: inability to eat ? ?Signs/Symptoms: NPO status ? ? ?Interventions: Tube feeding, Refer to RD note for recommendations ? ?Estimated body mass index is 21.55 kg/m? as calculated from the following: ?  Height as of this encounter: 5\' 11"  (1.803 m). ?  Weight as of this encounter: 70.1 kg. ? ?DVT prophylaxis: Heparin  ?code Status: Full code  ?family Communication: Discussed with wife at bedside disposition Plan:  Status is: Inpatient ?Remains inpatient appropriate because: sbo ?  ?Consultants:  ?surgery ? ?Procedures: Exploratory laparotomy and lysis of adhesions and small bowel resection ?Antimicrobials Zosyn started 05/28/2021 ?Subjective: Tachycardic ?He had a fever of 102.3 overnight patient complaining of nausea and abdominal pain he was able to tolerate clear liquids yesterday but he has been so nauseated today that he is not able to take anything p.o. ?Also complains of abdominal pain which is more than yesterday feels like he pulled a muscle ?Has a dry cough ? ?Objective: ?Vitals:  ? 05/31/21 0726 05/31/21 0908 05/31/21 1025 05/31/21 1140  ?BP: 115/81 111/80 133/80 126/82  ?Pulse: (!) 123 (!) 112 (!) 106 100  ?Resp: (!) 22 20    ?Temp: (!) 102.3 ?F (39.1 ?C) 100.2 ?F (37.9 ?C) 98.6 ?F (37 ?C) 98.4 ?F (36.9 ?C)  ?TempSrc: Oral Oral Oral Oral  ?SpO2: 94% 99% 99% 100%  ?Weight:      ?Height:      ? ? ?Intake/Output Summary (Last 24 hours) at 05/31/2021 1242 ?Last data filed at 05/31/2021 0600 ?Gross per 24 hour  ?Intake 3144.88 ml  ?Output 500 ml  ?  Net 2644.88 ml  ? ? ?Filed Weights  ? 05/25/21 1127 05/29/21 0617 05/31/21 0500  ?Weight: 71 kg 56.1 kg 70.1 kg  ? ? ?Examination: ? ?General exam: Appears in mild distress, weak ill appearing ?Respiratory system: Few scattered rhonchi to auscultation. Respiratory effort normal. ?Cardiovascular system: S1 & S2 heard, RRR. No JVD, murmurs, rubs, gallops or clicks. No pedal edema. ?Gastrointestinal system:  Abdomen is distended, soft and tender. No organomegaly or masses felt.decreased bowel sounds heard. ?Purulent drainage noted at the lower part of his incision ?Central nervous system: Alert and oriented. No focal neurological deficits. ?Extremities: Symmetric 5 x 5 power. ?Skin: No rashes, lesions or ulcers ?Psychiatry: Judgement and insight appear normal. Mood & affect appropriate.  ? ? ? ?Data Reviewed: I have personally reviewed following labs and imaging studies ? ?CBC: ?Recent Labs  ?Lab 05/27/21 ?0502 05/27/21 ?1536 05/28/21 ?0456 05/29/21 ?0455 05/30/21 ?0425 05/31/21 ?0159  ?WBC 3.9*  --  9.9 12.0* 15.0* 15.5*  ?NEUTROABS 2.8  --  8.6* 9.3* 10.9* 13.8*  ?HGB 7.4*  --  7.7* 8.6* 7.7* 7.7*  ?HCT 25.4*  --  26.2* 29.0* 26.5* 24.8*  ?MCV 76.7*  --  77.1* 76.1* 76.6* 74.9*  ?PLT PLATELET CLUMPS NOTED ON SMEAR, UNABLE TO ESTIMATE 193 211 228 176 199  ? ? ?Basic Metabolic Panel: ?Recent Labs  ?Lab 05/25/21 ?[id number] 05/26/21 ?[id number] 05/27/21 ?0502 05/28/21 ?[id number] 05/29/21 ?[id number] 05/30/21 ?0425 05/31/21 ?0159  ?NA 138 137 140 146* 150* 136 133*  ?K 4.0 3.8 3.4* 3.6 3.7 4.2 5.1  ?CL 97* 101 100 97* 91* 94* 100  ?CO2 31 30 32 38* >45* 33* 26  ?GLUCOSE 135* 141* 187* 216* 195* 164* 111*  ?BUN 29* 27* 28* 30* 46* 45* 39*  ?CREATININE 1.65* 1.40* 1.36* 1.35* 1.70* 1.72* 1.51*  ?CALCIUM 7.4* 7.9* 8.2* 8.6* 9.1 8.4* 8.4*  ?MG 2.3 2.3 2.3 2.3  --  2.1  --   ?PHOS 3.8 1.8* 2.4* 4.3  --  3.2  --   ? ? ?GFR: ?Estimated Creatinine Clearance: 47.1 mL/min (A) (by C-G formula based on SCr of 1.51 mg/dL (H)). ?Liver Function Tests: ?Recent Labs  ?Lab 05/25/21 ?[id number] 05/26/21 ?[id number] 05/27/21 ?0502 05/28/21 ?[id number] 05/30/21 ?0425  ?AST 17 19 16 15 19   ?ALT 11 12 10 11 13   ?ALKPHOS 44 44 46 53 69  ?BILITOT 1.0 0.4 0.7 0.6 0.5  ?PROT 5.6* 5.4* 5.9* 6.4* 6.9  ?ALBUMIN 2.7* 2.4* 2.4* 2.4* 2.3*  ? ? ?No results for input(s): LIPASE, AMYLASE in the last 168 hours. ? ?No results for input(s): AMMONIA in the last 168 hours. ?Coagulation Profile: ?No  results for input(s): INR, PROTIME in the last 168 hours. ?Cardiac Enzymes: ?No results for input(s): CKTOTAL, CKMB, CKMBINDEX, TROPONINI in the last 168 hours. ?BNP (last 3 results) ?No results for input(s): PROBNP in the last 8760 hours. ?HbA1C: ?No results for input(s): HGBA1C in the last 72 hours. ?CBG: ?Recent Labs  ?Lab 05/30/21 ?1808 05/31/21 ?0031 05/31/21 ?[id number] 05/31/21 ?[id number] 05/31/21 ?1135  ?GLUCAP 116* 140* 133* 116* 190*  ? ? ?Lipid Profile: ?Recent Labs  ?  05/30/21 ?0425  ?TRIG 169*  ? ? ?Thyroid Function Tests: ?No results for input(s): TSH, T4TOTAL, FREET4, T3FREE, THYROIDAB in the last 72 hours. ?Anemia Panel: ?No results for input(s): VITAMINB12, FOLATE, FERRITIN, TIBC, IRON, RETICCTPCT in the last 72 hours. ?Sepsis Labs: ?No results for input(s): PROCALCITON, LATICACIDVEN in the last 168 hours. ? ?No results found for this or any previous visit (from  the past 240 hour(s)).  ? ? ? ? ? ?Radiology Studies: ?CT ABDOMEN PELVIS WO CONTRAST ? ?Result Date: 05/31/2021 ?CLINICAL DATA:  Abdominal pain, postop. EXAM: CT ABDOMEN AND PELVIS WITHOUT CONTRAST TECHNIQUE: Multidetector CT imaging of the abdomen and pelvis was performed following the standard protocol without IV contrast. RADIATION DOSE REDUCTION: This exam was performed according to the departmental dose-optimization program which includes automated exposure control, adjustment of the mA and/or kV according to patient size and/or use of iterative reconstruction technique. COMPARISON:  05/20/2021. FINDINGS: Lower chest: Heart is normal in size and there is a trace pericardial effusion. Bilateral lower lobe bronchiectasis is noted with atelectasis at the lung bases. Hepatobiliary: No focal liver abnormality is seen. No gallstones, gallbladder wall thickening, or biliary dilatation. Pancreas: Unremarkable. No pancreatic ductal dilatation or surrounding inflammatory changes. Spleen: Normal in size without focal abnormality. Adrenals/Urinary Tract: The  adrenal glands are within normal limits. No renal calculus is seen bilaterally. Multiple renal cysts are present on the left, a few with calcification. No hydronephrosis. A small focus of air is present in t

## 2021-05-31 NOTE — Progress Notes (Signed)
?   05/31/21 0532  ?Assess: MEWS Score  ?Temp (!) 102.1 ?F (38.9 ?C)  ?BP 135/87  ?Pulse Rate (!) 124  ?Resp 20  ?SpO2 90 %  ?O2 Device Room Air  ?Assess: MEWS Score  ?MEWS Temp 2  ?MEWS Systolic 0  ?MEWS Pulse 2  ?MEWS RR 0  ?MEWS LOC 0  ?MEWS Score 4  ?MEWS Score Color Red  ?Assess: if the MEWS score is Yellow or Red  ?Were vital signs taken at a resting state? Yes  ?Focused Assessment Change from prior assessment (see assessment flowsheet)  ?Early Detection of Sepsis Score *See Row Information* High  ?MEWS guidelines implemented *See Row Information* Yes  ?Treat  ?MEWS Interventions Administered prn meds/treatments ?(Tylenol given)  ?Complains of Other (Comment) ?(Chills)  ?Take Vital Signs  ?Increase Vital Sign Frequency  Red: Q 1hr X 4 then Q 4hr X 4, if remains red, continue Q 4hrs  ?Escalate  ?MEWS: Escalate Red: discuss with charge nurse/RN and provider, consider discussing with RRT  ?Notify: Charge Nurse/RN  ?Name of Charge Nurse/RN Notified Emman RN  ?Date Charge Nurse/RN Notified 05/31/21  ?Time Charge Nurse/RN Notified 0532  ?Notify: Provider  ?Provider Name/Title N/A  ?Notify: Rapid Response  ?Name of Rapid Response RN Notified N/A  ?Document  ?Patient Outcome Not stable and remains on department  ?Progress note created (see row info) Yes  ? ? ?

## 2021-05-31 NOTE — Progress Notes (Signed)
Physical Therapy Treatment ?Patient Details ?Name: Justin Yang ?MRN: [id number] ?DOB: 01-Sep-1955 ?Today's Date: 05/31/2021 ? ? ?History of Present Illness 66 y/o male presented to ED on 05/19/21 for abdominal cramping and N/V. CT abdomen showed small bowel obstruction. S/p laparotomy with small bowel resection on 4/18. PMH: T2DM, hx of SBO, chronic microcytic anemia. ? ?  ?PT Comments  ? ? Continuing work on functional mobility and activity tolerance;  More abdominal pain today, But pt still very willing to walk in the hallway; Opted to use the RW for more stabilization today; Encouraged pt to walk the hallways with family later today  ?Recommendations for follow up therapy are one component of a multi-disciplinary discharge planning process, led by the attending physician.  Recommendations may be updated based on patient status, additional functional criteria and insurance authorization. ? ?Follow Up Recommendations ? Home health PT (as well as HHOT) ?  ?  ?Assistance Recommended at Discharge Intermittent Supervision/Assistance  ?Patient can return home with the following A little help with walking and/or transfers;Assist for transportation;Assistance with cooking/housework ?  ?Equipment Recommendations ? None recommended by PT  ?  ?Recommendations for Other Services  (Mobility Team) ? ? ?  ?Precautions / Restrictions Precautions ?Precautions: Fall ?Precaution Comments: abdominal incision  ?  ? ?Mobility ? Bed Mobility ?  ?  ?  ?  ?  ?  ?  ?  ?  ? ?Transfers ?Overall transfer level: Needs assistance ?Equipment used: Rolling walker (2 wheels) ?Transfers: Sit to/from Stand ?Sit to Stand: Min guard ?  ?  ?  ?  ?  ?General transfer comment: Cues to self-monitor for activity tolerance; Noting he stabilized himself with UEs holding furnitiure ?  ? ?Ambulation/Gait ?Ambulation/Gait assistance: Min guard (with and without physical contact) ?Gait Distance (Feet): 120 Feet ?Assistive device: Rolling walker (2 wheels) ?Gait  Pattern/deviations: Step-through pattern, Decreased step length - right, Decreased step length - left, Trunk flexed ?Gait velocity: decreased ?  ?  ?General Gait Details: More abdominal pain today, so opted to use RW to help stabilize wit UEs, and allow for a bit more relaxation of abdominal muscles; aBle to extend trunk for more upright posture when cued ? ? ?Stairs ?  ?  ?  ?  ?  ? ? ?Wheelchair Mobility ?  ? ?Modified Rankin (Stroke Patients Only) ?  ? ? ?  ?Balance   ?  ?Sitting balance-Leahy Scale: Fair ?  ?  ?  ?Standing balance-Leahy Scale: Poor (approaching Fair) ?  ?  ?  ?  ?  ?  ?  ?  ?  ?  ?  ?  ?  ? ?  ?Cognition Arousal/Alertness: Awake/alert ?Behavior During Therapy: [facility] for tasks assessed/performed ?Overall Cognitive Status: Within Functional Limits for tasks assessed ?  ?  ?  ?  ?  ?  ?  ?  ?  ?  ?  ?  ?  ?  ?  ?  ?  ?  ?  ? ?  ?Exercises   ? ?  ?General Comments General comments (skin integrity, edema, etc.): opted to use a brief for pt as he is unsure about possibility of bowels moving while wlaking ?  ?  ? ?Pertinent Vitals/Pain Pain Assessment ?Pain Assessment: Faces ?Faces Pain Scale: Hurts even more ?Pain Location: Abdominal pain and nausea ?Pain Descriptors / Indicators: Grimacing ?Pain Intervention(s): Limited activity within patient's tolerance  ? ? ?Home Living   ?  ?  ?  ?  ?  ?  ?  ?  ?  ?   ?  ?  Prior Function    ?  ?  ?   ? ?PT Goals (current goals can now be found in the care plan section) Acute Rehab PT Goals ?Patient Stated Goal: to go home ?Time For Goal Achievement: 06/13/21 ?Potential to Achieve Goals: Good ?Progress towards PT goals: Progressing toward goals ? ?  ?Frequency ? ? ? Min 3X/week ? ? ? ?  ?PT Plan Current plan remains appropriate  ? ? ?Co-evaluation   ?  ?  ?  ?  ? ?  ?AM-PAC PT "6 Clicks" Mobility   ?Outcome Measure ? Help needed turning from your back to your side while in a flat bed without using bedrails?: A Little ?Help needed moving from lying on your back to  sitting on the side of a flat bed without using bedrails?: A Little ?Help needed moving to and from a bed to a chair (including a wheelchair)?: A Little ?Help needed standing up from a chair using your arms (e.g., wheelchair or bedside chair)?: A Little ?Help needed to walk in hospital room?: A Little ?Help needed climbing 3-5 steps with a railing? : A Little ?6 Click Score: 18 ? ?  ?End of Session   ?Activity Tolerance: Patient tolerated treatment well ?Patient left: in bed;with call bell/phone within reach (sitting EOB) ?Nurse Communication: Mobility status ?PT Visit Diagnosis: Muscle weakness (generalized) (M62.81) ?  ? ? ?Time: [id number] ?PT Time Calculation (min) (ACUTE ONLY): 16 min ? ?Charges:  $Gait Training: 8-22 mins          ?          ? ?Van Clines, PT  ?Acute Rehabilitation Services ?Office [phone] ? ? ? ?Levi Aland ?05/31/2021, 3:49 PM ? ?

## 2021-05-31 NOTE — Progress Notes (Signed)
[facility] ?Progress Note ? ?7 Days Post-Op  ?Subjective: ?CC-  ?Febrile over night. He had a noncontrast CT of his abdomen/pelvis which shows persistent ileus, contrast in the colon with a large amount of stool in the rectosigmoid colon. ? ?Patient states that he is coughing/ spitting up phlegm. No emesis. Tolerated some liquids yesterday. Passing flatus, had some loose stool over night. ? ?Objective: ?Vital signs in last 24 hours: ?Temp:  [98.5 ?F (36.9 ?C)-102.3 ?F (39.1 ?C)] 102.3 ?F (39.1 ?C) (04/25 [id number]) ?Pulse Rate:  [76-124] 123 (04/25 0726) ?Resp:  [16-22] 22 (04/25 0726) ?BP: (113-135)/(67-87) 115/81 (04/25 0726) ?SpO2:  [90 %-98 %] 94 % (04/25 0726) ?Weight:  [70.1 kg] 70.1 kg (04/25 0500) ?Last BM Date : 05/30/21 ? ?Intake/Output from previous day: ?04/24 0701 - 04/25 0700 ?In: 3144.9 [P.O.:440; I.V.:2296.6; IV Piggyback:408.3] ?Out: 500 [Urine:500] ?Intake/Output this shift: ?No intake/output data recorded. ? ?PE: ?Gen:  Alert, NAD, pleasant ?Abd: soft, mild distension, mild tenderness over incision, +BS, proximal midline cdi with staples present, distal aspect open without cellulitis or purulent drainage ? ?Lab Results:  ?Recent Labs  ?  05/30/21 ?0425 05/31/21 ?0159  ?WBC 15.0* 15.5*  ?HGB 7.7* 7.7*  ?HCT 26.5* 24.8*  ?PLT 176 199  ? ?BMET ?Recent Labs  ?  05/30/21 ?0425 05/31/21 ?0159  ?NA 136 133*  ?K 4.2 5.1  ?CL 94* 100  ?CO2 33* 26  ?GLUCOSE 164* 111*  ?BUN 45* 39*  ?CREATININE 1.72* 1.51*  ?CALCIUM 8.4* 8.4*  ? ?PT/INR ?No results for input(s): LABPROT, INR in the last 72 hours. ?CMP  ?   ?Component Value Date/Time  ? NA 133 (L) 05/31/2021 0159  ? K 5.1 05/31/2021 0159  ? CL 100 05/31/2021 0159  ? CO2 26 05/31/2021 0159  ? GLUCOSE 111 (H) 05/31/2021 0159  ? BUN 39 (H) 05/31/2021 0159  ? CREATININE 1.51 (H) 05/31/2021 0159  ? CALCIUM 8.4 (L) 05/31/2021 0159  ? PROT 6.9 05/30/2021 0425  ? ALBUMIN 2.3 (L) 05/30/2021 0425  ? AST 19 05/30/2021 0425  ? ALT 13 05/30/2021 0425  ? ALKPHOS  69 05/30/2021 0425  ? BILITOT 0.5 05/30/2021 0425  ? GFRNONAA 50 (L) 05/31/2021 0159  ? GFRAA >60 07/06/2015 0458  ? ?Lipase  ?   ?Component Value Date/Time  ? LIPASE 34 05/20/2021 0225  ? ? ? ? ? ?Studies/Results: ?CT ABDOMEN PELVIS WO CONTRAST ? ?Result Date: 05/31/2021 ?CLINICAL DATA:  Abdominal pain, postop. EXAM: CT ABDOMEN AND PELVIS WITHOUT CONTRAST TECHNIQUE: Multidetector CT imaging of the abdomen and pelvis was performed following the standard protocol without IV contrast. RADIATION DOSE REDUCTION: This exam was performed according to the departmental dose-optimization program which includes automated exposure control, adjustment of the mA and/or kV according to patient size and/or use of iterative reconstruction technique. COMPARISON:  05/20/2021. FINDINGS: Lower chest: Heart is normal in size and there is a trace pericardial effusion. Bilateral lower lobe bronchiectasis is noted with atelectasis at the lung bases. Hepatobiliary: No focal liver abnormality is seen. No gallstones, gallbladder wall thickening, or biliary dilatation. Pancreas: Unremarkable. No pancreatic ductal dilatation or surrounding inflammatory changes. Spleen: Normal in size without focal abnormality. Adrenals/Urinary Tract: The adrenal glands are within normal limits. No renal calculus is seen bilaterally. Multiple renal cysts are present on the left, a few with calcification. No hydronephrosis. A small focus of air is present in the urinary bladder. Stomach/Bowel: Mildly distended loops of small bowel with air-fluid levels are noted in the abdomen. No transition  point is identified. Scattered diverticula are present along the colon without evidence of diverticulitis. Locules of free air are noted in the abdomen which is likely related to postoperative status. No pneumatosis. The appendix is within normal limits. Contrast is present in the colon with large amount of stool in the rectosigmoid colon. Vascular/Lymphatic: Aortic  atherosclerosis. No enlarged abdominal or pelvic lymph nodes. Reproductive: Prostate is unremarkable. Other: Postsurgical changes in air and surgical staples are noted in the abdominal wall in the midline. No ascites. Musculoskeletal: Degenerative changes are present in the thoracolumbar spine. IMPRESSION: 1. Postsurgical changes in the anterior abdominal wall with air in the subcutaneous soft tissues and a few foci of air in the abdomen which are likely related to patient's postoperative status. 2. Multiple loops of mildly dilated small bowel in the abdomen with no definite transition point, possible ileus versus partial or early small bowel obstruction. Surgical consultation is recommended. 3. Contrast in the colon with a large amount of stool in the rectosigmoid colon with possible impaction. 4. Aortic atherosclerosis. 5. Remaining findings as described above. Electronically Signed   By: Brett Fairy M.D.   On: 05/31/2021 02:46   ? ?Anti-infectives: ?Anti-infectives (From admission, onward)  ? ? Start     Dose/Rate Route Frequency Ordered Stop  ? 05/28/21 1215  piperacillin-tazobactam (ZOSYN) IVPB 3.375 g       ? 3.375 g ?12.5 mL/hr over 240 Minutes Intravenous Every 8 hours 05/28/21 1125    ? ?  ? ? ? ?Assessment/Plan ?POD #7 elap/loa/sbr-[place] ?- Febrile up to 102.3. Noncontrast CT of his abdomen/pelvis shows persistent ileus, contrast in the colon with a large amount of stool in the rectosigmoid colon, no intraabdominal abscess ?- Enema ordered. Continue clear liquids. Continue TPN. ?- wound infection improved, no longer having purulent drainage and there is no cellulitis. Continue BID packing ?- check CXR, u/a, Ucx, Bcx for fevers ?  ?ID - Zosyn 4/22>> ?FEN - IVF, TPN, CLD ?VTE - sq heparin ?Foley - out and voiding ?  ?ABL anemia  ?AKI - Cr 1.51, improving ?DM ?HLD ? ? ? LOS: 11 days  ? ? ?Wellington Hampshire, PA-C ?[facility] ?05/31/2021, 8:59 AM ?Please see Amion for pager number during day  hours 7:00am-4:30pm ? ?

## 2021-05-31 NOTE — Progress Notes (Signed)
OT Cancellation Note ? ?Patient Details ?Name: Justin Yang ?MRN: [id number] ?DOB: 1955/08/02 ? ? ?Cancelled Treatment:    Reason Eval/Treat Not Completed: Patient declined, no reason specified (Pt polietly declined participation. He reports pain and he just returned to the bed to rest. OT treatment to f/u as able) ? ?Terrianna Holsclaw A Macaria Bias ?05/31/2021, 3:37 PM ?

## 2021-05-31 NOTE — Progress Notes (Signed)
PHARMACY - TOTAL PARENTERAL NUTRITION CONSULT NOTE  ? ?Indication: Small bowel obstruction ? ?Patient Measurements: ?Height: 5\' 11"  (180.3 cm) ?Weight: 70.1 kg (154 lb 8.7 oz) ?IBW/kg (Calculated) : 75.3 ?TPN AdjBW (KG): 70.1 ?Body mass index is 21.55 kg/m?. ?Usual Weight: 81.8 kg ? ?Assessment: [name] with PMH of T2DM, HLD, chronic microcytic anemia, h/o SBO requiring lysis of adhesions (2017) presented to the ED on 4/13 for new onset nausea/vomiting starting on 4/12. KUB on admit was c/w high-grade SBO. Pt was made NPO and NGT was placed for decompression. IVF were initiated. Pt failed to respond to conservative measures and underwent surgery on 4/18 - laparotomy with lysis of adhesions and small bowel resection. ? ?Patient was eating normal diet prior to symptom onset. Pt reports his last meal was bacon, egg, and cheese sandwich from a gas station prior to nausea/vomiting starting on 4/12. Pt has been NPO since admission on 4/13. Pharmacy consulted to initiate and manage TPN therapy on 4/19. ? ?Glucose / Insulin: BG <150, used 6 units insulin/24 hrs + 14 units in TPN  ?Electrolytes: Na 133 (down, now off D5W), K 5.1 up (cortrak out, no longer having high GI output/emesis), CoCa 9.8, others wnl  ?Renal: SCr 1.51 down, BUN 39 down ?Hepatic: LFTs wnl, albumin 2.3. TG trending up to 169. ?Intake / Output; MIVF:  UOP 535ml charted. LBM 4/25 AM per patient.  Passing flatus   ?GI Imaging:  ?4/14 CT abd: SBP, large stool burden with impacted stool  ?4/14 KUB: high grade SBO    ?4/15 KUB: persistent partial SBO ?4/16 KUB: persistent partial SBO  ?4/17 KUB: stable SBO ?4/18 KUB: no improvement or worsening in small bowel dilatation. ?4/21 KUB: dilated loops of small bowel c/w post-op ileus or recurrent pSBO ?4/25 CT abd: Multiple loops of mildly dilated small bowel, possible ileus vs partial/early SBO  ?GI Surgeries / Procedures:  ?4/18 - laparostomy with lysis of adhesions and small bowel resection ? ?Central access:  PICC placed 4/19 ?TPN start date: 05/25/21 ? ?Nutritional Goals: ?Goal TPN rate is 90 mL/hr (provides 110 g of protein and 2160 kcals per day) ? ?RD Assessment: ?Estimated Needs ?Total Energy Estimated Needs: 2000-2200 kcal/d ?Total Protein Estimated Needs: 100-110 g/d ?Total Fluid Estimated Needs: >/=2.2 L/d ? ?Current Nutrition:  ?TPN + FLD ?4/24 ate: 1 chicken broth, 1 jello, 1 pudding, 1 Boost. Feels a lot of acid reflux every time he eats ?4/25 AM: breakfast tray with grits, ice cream, coffee delivered but patient had not started eating yet and said he doubted he would eat much. He feels like he pulled a muscle and is sore on the right side  ? ?Plan:  ?Continue TPN at goal rate of 71mL/hr meeting 100% of pt's estimated needs  ?Electrolytes in TPN: Increase Na 75 mEq/L; decrease K 40 mEq/L and [id number] 2:1; Continue Ca to 3 mEq/L, Phos 15 mmol/L, Mg 5 mEq/L.   ?Add standard MVI and trace elements to TPN ?Decrease insulin to 12 units in TPN  ?Stop SSI, check BG Q12 hr ?Monitor TPN labs tomorrow and Mon/Thurs ?F/u PO tolerance and wean TPN as able ? ?Benetta Spar, PharmD, BCPS, BCCP ?Clinical Pharmacist ? ?Please check AMION for all [facility] phone numbers ?After 10:00 PM, call [facility] [phone] ? ?

## 2021-06-01 ENCOUNTER — Encounter (HOSPITAL_COMMUNITY): Payer: No Typology Code available for payment source

## 2021-06-01 DIAGNOSIS — R509 Fever, unspecified: Secondary | ICD-10-CM

## 2021-06-01 DIAGNOSIS — E1100 Type 2 diabetes mellitus with hyperosmolarity without nonketotic hyperglycemic-hyperosmolar coma (NKHHC): Secondary | ICD-10-CM | POA: Diagnosis not present

## 2021-06-01 DIAGNOSIS — E440 Moderate protein-calorie malnutrition: Secondary | ICD-10-CM | POA: Insufficient documentation

## 2021-06-01 DIAGNOSIS — D509 Iron deficiency anemia, unspecified: Secondary | ICD-10-CM | POA: Diagnosis not present

## 2021-06-01 DIAGNOSIS — N179 Acute kidney failure, unspecified: Secondary | ICD-10-CM | POA: Diagnosis not present

## 2021-06-01 DIAGNOSIS — K56609 Unspecified intestinal obstruction, unspecified as to partial versus complete obstruction: Secondary | ICD-10-CM | POA: Diagnosis not present

## 2021-06-01 LAB — CBC WITH DIFFERENTIAL/PLATELET
Abs Immature Granulocytes: 0.58 10*3/uL — ABNORMAL HIGH (ref 0.00–0.07)
Basophils Absolute: 0 10*3/uL (ref 0.0–0.1)
Basophils Relative: 0 %
Eosinophils Absolute: 0 10*3/uL (ref 0.0–0.5)
Eosinophils Relative: 0 %
HCT: 22.5 % — ABNORMAL LOW (ref 39.0–52.0)
Hemoglobin: 6.9 g/dL — CL (ref 13.0–17.0)
Immature Granulocytes: 3 %
Lymphocytes Relative: 6 %
Lymphs Abs: 1.2 10*3/uL (ref 0.7–4.0)
MCH: 23.2 pg — ABNORMAL LOW (ref 26.0–34.0)
MCHC: 30.7 g/dL (ref 30.0–36.0)
MCV: 75.8 fL — ABNORMAL LOW (ref 80.0–100.0)
Monocytes Absolute: 1.3 10*3/uL — ABNORMAL HIGH (ref 0.1–1.0)
Monocytes Relative: 7 %
Neutro Abs: 16.8 10*3/uL — ABNORMAL HIGH (ref 1.7–7.7)
Neutrophils Relative %: 84 %
Platelets: 196 10*3/uL (ref 150–400)
RBC: 2.97 MIL/uL — ABNORMAL LOW (ref 4.22–5.81)
RDW: 19.3 % — ABNORMAL HIGH (ref 11.5–15.5)
WBC: 20 10*3/uL — ABNORMAL HIGH (ref 4.0–10.5)
nRBC: 0 % (ref 0.0–0.2)

## 2021-06-01 LAB — PREPARE RBC (CROSSMATCH)

## 2021-06-01 LAB — PHOSPHORUS: Phosphorus: 3.8 mg/dL (ref 2.5–4.6)

## 2021-06-01 LAB — BASIC METABOLIC PANEL
Anion gap: 6 (ref 5–15)
BUN: 38 mg/dL — ABNORMAL HIGH (ref 8–23)
CO2: 21 mmol/L — ABNORMAL LOW (ref 22–32)
Calcium: 8.2 mg/dL — ABNORMAL LOW (ref 8.9–10.3)
Chloride: 108 mmol/L (ref 98–111)
Creatinine, Ser: 1.56 mg/dL — ABNORMAL HIGH (ref 0.61–1.24)
GFR, Estimated: 48 mL/min — ABNORMAL LOW (ref >60)
Glucose, Bld: 179 mg/dL — ABNORMAL HIGH (ref 70–99)
Potassium: 5.5 mmol/L — ABNORMAL HIGH (ref 3.5–5.1)
Sodium: 135 mmol/L (ref 135–145)

## 2021-06-01 LAB — GLUCOSE, CAPILLARY
Glucose-Capillary: 169 mg/dL — ABNORMAL HIGH (ref 70–99)
Glucose-Capillary: 146 mg/dL — ABNORMAL HIGH (ref 70–99)

## 2021-06-01 LAB — MAGNESIUM: Magnesium: 2.2 mg/dL (ref 1.7–2.4)

## 2021-06-01 LAB — URINE CULTURE: Culture: NO GROWTH

## 2021-06-01 MED ORDER — SODIUM CHLORIDE 0.9% IV SOLUTION: Freq: Once | INTRAVENOUS | Status: AC

## 2021-06-01 MED ORDER — DEXTROSE-NACL 10-0.45 % IV SOLN
INTRAVENOUS | Status: AC
  Filled 2021-06-01 (×3): qty 1000

## 2021-06-01 MED ORDER — SODIUM BICARBONATE 8.4 % IV SOLN: 50.0000 meq | Freq: Once | INTRAVENOUS | Status: DC

## 2021-06-01 MED ORDER — TRAVASOL 10 % IV SOLN
INTRAVENOUS | Status: AC
  Filled 2021-06-01: qty 1101.6

## 2021-06-01 MED ORDER — FLEET ENEMA 7-19 GM/118ML RE ENEM
1.0000 | ENEMA | Freq: Once | RECTAL | Status: AC
  Administered 2021-06-01: 1 via RECTAL
  Filled 2021-06-01: qty 1

## 2021-06-01 MED ORDER — SODIUM ZIRCONIUM CYCLOSILICATE 10 G PO PACK
10.0000 g | PACK | Freq: Once | ORAL | Status: AC
  Administered 2021-06-01: 10 g via ORAL
  Filled 2021-06-01: qty 1

## 2021-06-01 NOTE — Progress Notes (Incomplete)
{  OT ALL NOTES:[id number]}

## 2021-06-01 NOTE — Progress Notes (Addendum)
PHARMACY - TOTAL PARENTERAL NUTRITION CONSULT NOTE  ? ?Indication: Small bowel obstruction ? ?Patient Measurements: ?Height: 5\' 11"  (180.3 cm) ?Weight: 70.1 kg (154 lb 8.7 oz) ?IBW/kg (Calculated) : 75.3 ?TPN AdjBW (KG): 70.1 ?Body mass index is 21.55 kg/m?. ?Usual Weight: 81.8 kg ? ?Assessment: [name] with PMH of T2DM, HLD, chronic microcytic anemia, h/o SBO requiring lysis of adhesions (2017) presented to the ED on 4/13 for new onset nausea/vomiting starting on 4/12. KUB on admit was c/w high-grade SBO. Pt was made NPO and NGT was placed for decompression. IVF were initiated. Pt failed to respond to conservative measures and underwent surgery on 4/18 - laparotomy with lysis of adhesions and small bowel resection. ? ?Patient was eating normal diet prior to symptom onset. Pt reports his last meal was bacon, egg, and cheese sandwich from a gas station prior to nausea/vomiting starting on 4/12. Pt has been NPO since admission on 4/13. Pharmacy consulted to initiate and manage TPN therapy on 4/19. ? ?Glucose / Insulin: BG <180, off SSI with 12 units in TPN  ?Electrolytes: Na 135, K 5.5 up (despite halving K in TPN), CO2 21 (NaHCO3 50 mEq x1 ordered), CoCa 9.6, others wnl  ?Renal: SCr 1.56, BUN 38 ?Hepatic: LFTs wnl, albumin 2.3. TG trending up to 169. ?Intake / Output; MIVF: NS @30ml /hr.  UOP incompletely charted. LBM 4/25.  Passing flatus   ?GI Imaging:  ?4/14 CT abd: SBP, large stool burden with impacted stool  ?4/14 KUB: high grade SBO    ?4/15 KUB: persistent partial SBO ?4/16 KUB: persistent partial SBO  ?4/17 KUB: stable SBO ?4/18 KUB: no improvement or worsening in small bowel dilatation. ?4/21 KUB: dilated loops of small bowel c/w post-op ileus or recurrent pSBO ?4/25 CT abd: Multiple loops of mildly dilated small bowel, possible ileus vs partial/early SBO  ?GI Surgeries / Procedures:  ?4/18 - laparostomy with lysis of adhesions and small bowel resection ? ?Central access: PICC placed 4/19 ?TPN start date:  05/25/21 ? ?Nutritional Goals: ?Goal TPN rate is 90 mL/hr (provides 110 g of protein and 2116 kcals per day) ? ?RD Assessment: ?Estimated Needs ?Total Energy Estimated Needs: 2000-2200 kcal/d ?Total Protein Estimated Needs: 100-110 g/d ?Total Fluid Estimated Needs: >/=2.2 L/d ? ?Current Nutrition:  ?TPN + FLD ?4/24 ate: 1 chicken broth, 1 jello, 1 pudding, 1 Boost. Feels a lot of acid reflux every time he eats ?4/25 AM: breakfast tray with grits, ice cream, coffee delivered but patient did not eat anything due to nausea ? ?Plan:  ?Discussed with team, agreed with holding TPN to treat high K level. Will start D10halfNS at 90 ml/hr to prevent hypoglycemia. RN informed ?Continue TPN at goal rate of 90 mL/hr meeting 100% of pt's estimated needs  ?Electrolytes in TPN: Increase Na 100 mEq/L; Decrease K 5 mEq/L, Mg 3 mEq/L, Phos 10 mmol/L, and Cl:Acet 1:2; Continue Ca to 3 mEq/L ?Add standard MVI and trace elements to TPN ?Lokelma 10g x1 ?Continue insulin to 12 units in TPN, check BG Q12 hr ?Additional fluid per MD  ?Monitor TPN labs Mon/Thurs, check Triglyceride trend 4/27  ?F/u PO tolerance and wean TPN as able ? ?5/25, PharmD, BCPS, BCCP ?Clinical Pharmacist ? ?Please check AMION for all [facility] phone numbers ?After 10:00 PM, call Main Pharmacy [phone] ? ?

## 2021-06-01 NOTE — Progress Notes (Signed)
[facility] ?Progress Note ? ?8 Days Post-Op  ?Subjective: ?CC-  ?Abdomen feeling better this morning. Denies any nausea or vomiting. Tolerated some clears yesterday. Fairly good BM with enema. Passing some flatus. Persistent low grade temps. ? ?Objective: ?Vital signs in last 24 hours: ?Temp:  [97.5 ?F (36.4 ?C)-100.1 ?F (37.8 ?C)] 100.1 ?F (37.8 ?C) (04/26 [id number]) ?Pulse Rate:  [92-106] 101 (04/26 0856) ?Resp:  [18-26] 20 (04/26 0856) ?BP: (113-133)/(70-82) 113/70 (04/26 0856) ?SpO2:  [96 %-100 %] 96 % (04/26 0856) ?Last BM Date : 05/31/21 ? ?Intake/Output from previous day: ?04/25 0701 - 04/26 0700 ?In: 2476.4 [P.O.:240; I.V.:1984.9; IV Piggyback:251.5] ?Out: 300 [Urine:300] ?Intake/Output this shift: ?Total I/O ?In: 240 [P.O.:240] ?Out: -  ? ?PE: ?Gen:  Alert, NAD, pleasant ?Abd: soft, minimal distension, mild tenderness over incision, +BS, proximal midline cdi with staples present, distal aspect open without cellulitis/ some purulent drainage noted  ? ?Lab Results:  ?Recent Labs  ?  05/31/21 ?0159 06/01/21 ?[id number]  ?WBC 15.5* 20.0*  ?HGB 7.7* 6.9*  ?HCT 24.8* 22.5*  ?PLT 199 196  ? ?BMET ?Recent Labs  ?  05/31/21 ?0159 06/01/21 ?0314  ?NA 133* 135  ?K 5.1 5.5*  ?CL 100 108  ?CO2 26 21*  ?GLUCOSE 111* 179*  ?BUN 39* 38*  ?CREATININE 1.51* 1.56*  ?CALCIUM 8.4* 8.2*  ? ?PT/INR ?No results for input(s): LABPROT, INR in the last 72 hours. ?CMP  ?   ?Component Value Date/Time  ? NA 135 06/01/2021 0314  ? K 5.5 (H) 06/01/2021 0314  ? CL 108 06/01/2021 0314  ? CO2 21 (L) 06/01/2021 0314  ? GLUCOSE 179 (H) 06/01/2021 0314  ? BUN 38 (H) 06/01/2021 0314  ? CREATININE 1.56 (H) 06/01/2021 0314  ? CALCIUM 8.2 (L) 06/01/2021 0314  ? PROT 6.9 05/30/2021 0425  ? ALBUMIN 2.3 (L) 05/30/2021 0425  ? AST 19 05/30/2021 0425  ? ALT 13 05/30/2021 0425  ? ALKPHOS 69 05/30/2021 0425  ? BILITOT 0.5 05/30/2021 0425  ? GFRNONAA 48 (L) 06/01/2021 0314  ? GFRAA >60 07/06/2015 0458  ? ?Lipase  ?   ?Component Value Date/Time  ? LIPASE  34 05/20/2021 0225  ? ? ? ? ? ?Studies/Results: ?CT ABDOMEN PELVIS WO CONTRAST ? ?Result Date: 05/31/2021 ?CLINICAL DATA:  Abdominal pain, postop. EXAM: CT ABDOMEN AND PELVIS WITHOUT CONTRAST TECHNIQUE: Multidetector CT imaging of the abdomen and pelvis was performed following the standard protocol without IV contrast. RADIATION DOSE REDUCTION: This exam was performed according to the departmental dose-optimization program which includes automated exposure control, adjustment of the mA and/or kV according to patient size and/or use of iterative reconstruction technique. COMPARISON:  05/20/2021. FINDINGS: Lower chest: Heart is normal in size and there is a trace pericardial effusion. Bilateral lower lobe bronchiectasis is noted with atelectasis at the lung bases. Hepatobiliary: No focal liver abnormality is seen. No gallstones, gallbladder wall thickening, or biliary dilatation. Pancreas: Unremarkable. No pancreatic ductal dilatation or surrounding inflammatory changes. Spleen: Normal in size without focal abnormality. Adrenals/Urinary Tract: The adrenal glands are within normal limits. No renal calculus is seen bilaterally. Multiple renal cysts are present on the left, a few with calcification. No hydronephrosis. A small focus of air is present in the urinary bladder. Stomach/Bowel: Mildly distended loops of small bowel with air-fluid levels are noted in the abdomen. No transition point is identified. Scattered diverticula are present along the colon without evidence of diverticulitis. Locules of free air are noted in the abdomen which is likely related to  postoperative status. No pneumatosis. The appendix is within normal limits. Contrast is present in the colon with large amount of stool in the rectosigmoid colon. Vascular/Lymphatic: Aortic atherosclerosis. No enlarged abdominal or pelvic lymph nodes. Reproductive: Prostate is unremarkable. Other: Postsurgical changes in air and surgical staples are noted in the  abdominal wall in the midline. No ascites. Musculoskeletal: Degenerative changes are present in the thoracolumbar spine. IMPRESSION: 1. Postsurgical changes in the anterior abdominal wall with air in the subcutaneous soft tissues and a few foci of air in the abdomen which are likely related to patient's postoperative status. 2. Multiple loops of mildly dilated small bowel in the abdomen with no definite transition point, possible ileus versus partial or early small bowel obstruction. Surgical consultation is recommended. 3. Contrast in the colon with a large amount of stool in the rectosigmoid colon with possible impaction. 4. Aortic atherosclerosis. 5. Remaining findings as described above. Electronically Signed   By: Brett Fairy M.D.   On: 05/31/2021 02:46  ? ?DG CHEST PORT 1 VIEW ? ?Result Date: 05/31/2021 ?CLINICAL DATA:  Cough. EXAM: PORTABLE CHEST 1 VIEW COMPARISON:  05/28/2021 FINDINGS: There is a right arm PICC line with tip terminating in the right atrium. Stable cardiomediastinal contours. No pleural effusion or edema. No airspace densities. Lung volumes are low and there is mild subsegmental atelectasis in the left base. IMPRESSION: 1. Low lung volumes. 2. Left base subsegmental atelectasis. Electronically Signed   By: Kerby Moors M.D.   On: 05/31/2021 09:50   ? ?Anti-infectives: ?Anti-infectives (From admission, onward)  ? ? Start     Dose/Rate Route Frequency Ordered Stop  ? 05/28/21 1215  piperacillin-tazobactam (ZOSYN) IVPB 3.375 g       ? 3.375 g ?12.5 mL/hr over 240 Minutes Intravenous Every 8 hours 05/28/21 1125    ? ?  ? ? ? ?Assessment/Plan ?POD #8 elap/loa/sbr-Justin Yang ?- Advance to full liquids. Give another enema today. Holding TPN due to hyperkalemia and giving lokelma, plan to resume TPN tomorrow. ?- some persistent purulent drainage from midline wound, continue BID packing where staples were previously removed.  ?- continue fever/leukocytosis work up. Noncontrast CT of his  abdomen/pelvis 4/25 shows persistent ileus, contrast in the colon with a large amount of stool in the rectosigmoid colon, no intraabdominal abscess ?Ucx negative. Bcx NGTD and pending. CXR with some atelectasis otherwise ok. No indication to check for c diff at this point.  ? ? ?ID - Zosyn 4/22>> ?FEN - IVF, TPN, CLD ?VTE - sq heparin ?Foley - out and voiding ?  ?ABL anemia - Hgb 6.9, getting 1u PRBCs today ?AKI - Cr 1.56, stable ?DM ?HLD ? ? ? LOS: 12 days  ? ? ?Justin Hampshire, PA-C ?[facility] ?06/01/2021, 10:05 AM ?Please see Amion for pager number during day hours 7:00am-4:30pm ? ?

## 2021-06-01 NOTE — Progress Notes (Signed)
Pt. Was making steady gains in therapy. Pt. Is motivated to work with OT to [employer] function and I.  ? 06/01/21 1100  ?OT Visit Information  ?Last OT Received On 06/01/21  ?Assistance Needed +1  ?History of Present Illness 66 y/o male presented to ED on 05/19/21 for abdominal cramping and N/V. CT abdomen showed small bowel obstruction. S/p laparotomy with small bowel resection on 4/18. PMH: T2DM, hx of SBO, chronic microcytic anemia.  ?Precautions  ?Precautions Fall  ?Precaution Comments abdominal incision  ?Restrictions  ?Weight Bearing Restrictions No  ?Pain Assessment  ?Pain Assessment 0-10  ?Pain Score 8  ?Pain Location abdominal pai  ?Pain Intervention(s) Limited activity within patient's tolerance;Patient requesting pain meds-RN notified  ?Cognition  ?Arousal/Alertness Awake/alert  ?Behavior During Therapy [facility] for tasks assessed/performed  ?Overall Cognitive Status Within Functional Limits for tasks assessed  ?Upper Extremity Assessment  ?Upper Extremity Assessment Generalized weakness  ?Lower Extremity Assessment  ?Lower Extremity Assessment Defer to PT evaluation  ?Vision- Assessment  ?Vision Assessment? No apparent visual deficits  ?ADL  ?Overall ADL's  Needs assistance/impaired  ?Eating/Feeding Independent;Sitting  ?Grooming Supervision/safety;Standing  ?Upper Body Bathing Set up;Sitting  ?Lower Body Bathing Minimal assistance;Sit to/from stand  ?Upper Body Dressing  Set up;Sitting  ?Lower Body Dressing Minimal assistance;Sit to/from stand  ?Psychologist, clinical guard;Ambulation;Rolling walker (2 wheels)  ?Toileting- Clothing Manipulation and Hygiene Supervision/safety;Sitting/lateral lean  ?Functional mobility during ADLs Min guard ?(cues for proper hand placement.)  ?General ADL Comments Pt. was ed on cross leg techique for LE dressing.  ?Bed Mobility  ?Overal bed mobility Needs Assistance  ?Bed Mobility Sit to Supine;Supine to Sit  ?Supine to sit Supervision  ?Sit to supine Supervision  ?Transfers   ?Overall transfer level Needs assistance  ?Sit to Stand Min guard  ?General transfer comment cues for proper hand placement  ?Balance  ?Sitting-balance support Feet supported  ?Sitting balance-Leahy Scale Good  ?Standing balance-Leahy Scale Fair  ?OT - End of Session  ?Equipment Utilized During Treatment Gait belt;Rolling walker (2 wheels)  ?Activity Tolerance Patient tolerated treatment well  ?Patient left in bed;with call bell/phone within reach;with bed alarm set;with family/visitor present  ?Nurse Communication  ?(ok therapy)  ?OT Assessment/Plan  ?OT Plan Discharge plan remains appropriate  ?OT Visit Diagnosis Unsteadiness on feet (R26.81);Muscle weakness (generalized) (M62.81);Pain  ?OT Frequency (ACUTE ONLY) Min 2X/week  ?Follow Up Recommendations Home health OT  ?Assistance recommended at discharge Intermittent Supervision/Assistance  ?Patient can return home with the following A little help with walking and/or transfers;A little help with bathing/dressing/bathroom;Direct supervision/assist for medications management;Help with stairs or ramp for entrance;Assist for transportation  ?OT Equipment  ?(BSC DELIVERED TO ROOM)  ?AM-PAC OT "6 Clicks" Daily Activity Outcome Measure (Version 2)  ?Help from another person eating meals? 4  ?Help from another person taking care of personal grooming? 3  ?Help from another person toileting, which includes using toliet, bedpan, or urinal? 3  ?Help from another person bathing (including washing, rinsing, drying)? 3  ?Help from another person to put on and taking off regular upper body clothing? 3  ?Help from another person to put on and taking off regular lower body clothing? 3  ?6 Click Score 19  ?Progressive Mobility  ?What is the highest level of mobility based on the progressive mobility assessment? Level 5 (Walks with assist in room/hall) - Balance while stepping forward/back and can walk in room with assist - Complete  ?Activity Ambulated with assistance in room  ?OT  Goal Progression  ?Progress towards OT  goals Progressing toward goals  ?Acute Rehab OT Goals  ?Patient Stated Goal Pt. wants to increase abiltiy to care for hmself.  ?OT Goal Formulation With patient  ?Time For Goal Achievement 06/12/21  ?Potential to Achieve Goals Good  ?ADL Goals  ?Pt Will Perform Grooming with modified independence;standing  ?Pt Will Perform Lower Body Dressing with modified independence;sit to/from stand  ?Pt Will Transfer to Toilet with modified independence;ambulating  ?Additional ADL Goal #1 Pt will demonstrate increased activity tolerance to complete 3 ADL tasks in standing wtih supervision A  ?OT Time Calculation  ?OT Start Time (ACUTE ONLY) 1058  ?OT Stop Time (ACUTE ONLY) 1136  ?OT Time Calculation (min) 38 min  ?OT General Charges  ?$OT Visit 1 Visit  ?OT Treatments  ?$Self Care/Home Management  23-37 mins  ?$Therapeutic Activity 8-22 mins  ?06/01/2021 ?Derrek Gu OT/L ? ? ?

## 2021-06-01 NOTE — Progress Notes (Signed)
TRH night cross cover note: ? ?I was contacted by RN regarding patient's request for resumption of his home omeprazole.  RN request that PPI be given in IV form as the patient is reportedly showing some difficulty swallowing pills, but without failure of bedside swallow screen. Current order for clear liquid diet noted. ? ?I subsequently placed order for Protonix 40 mg IV daily, with first dose now. ? ? ? ? ?Newton Pigg, DO ?Hospitalist ? ?

## 2021-06-01 NOTE — Progress Notes (Signed)
Nutrition Follow-up ? ?DOCUMENTATION CODES:  ? ?Not applicable, Non-severe (moderate) malnutrition in context of chronic illness ? ?INTERVENTION:  ? ?Continue Glucerna Shake po TID, each supplement provides 220 kcal and 10 grams of protein. Suggested to pt that if the supplement tastes too sweet he can mix with milk or pour over ice.  ? ?Continue TPN to meet 100% estimated nutritional needs ?-Recommend continuing TPN until diet advanced to Soft and pt demonstrating tolerance and meeting >/=60% of nutritional needs ? ? ?NUTRITION DIAGNOSIS:  ? ?Moderate Malnutrition related to chronic illness as evidenced by moderate fat depletion, moderate muscle depletion. ? ?Being addressed via diet advancement, supplements, TPN ? ?GOAL:  ? ?Patient will meet greater than or equal to 90% of their needs ? ? ?MONITOR:  ? ?Diet advancement, PO intake, Supplement acceptance ? ?REASON FOR ASSESSMENT:  ? ?Malnutrition Screening Tool ?  ? ?ASSESSMENT:  ? ?66 y.o. male with hx of DM type 2, HLD, and hx of SBO presented to ED with nausea and vomiting for ~ 2 weeks. Poor PO during this time. Found to have AKI and imaging suggestive of SBO. ? ?4/14 - NGT placed to LIWS ?4/18 -  s/p laparotomy w/ LOA and small bowel resection ?4/19 - TPN initiated ? ?Pt alert on visit today, tolerating liquid diet. Wife at bedside.  ? ?Pt likes the soups and grits; pt reports he does not eat much diary like yogurt and some things taste too sweet right now including Ensure and Glucerna. RD would prefer for pt to consume Ensure Enlive as it has more calorie and protein per container and Glucerna but pt prefers Glucerna at this time. Pt pouring over ice to "dilute" the sweetness. Also encouraged pt to add milk to Glucerna to reduce the sweetness if needed.  ? ?Pt denies any N/V/abdominal pain on visit today. +flatus, +BM with enema ? ?TPN on hold remainder of the day due to hyperkalemia on lab results this AM. Plan to hang D10 and resume TPN this evening.   ? ?Some purulent drainage from midline wound per MD notes.  ? ?Current wt 70.1 kg; noted weight of 56.1 kg on 4/23 likely an error. Weight from 4/14 72.3 kg. Admission weight of 70.8 kg. Reports UBW around 170 pounds. 9% wt loss over unknown time frame. Weight since admission appears relatively stable.  ? ?Labs: potassium 5.5 (H), Creatinine 1.56, BUN 36, CBGs 167-190 ?Meds: reviewed ? ? ?NUTRITION - FOCUSED PHYSICAL EXAM: ? ?Flowsheet Row Most Recent Value  ?Orbital Region Moderate depletion  ?Upper Arm Region Moderate depletion  ?Thoracic and Lumbar Region Severe depletion  ?Buccal Region Moderate depletion  ?Temple Region Moderate depletion  ?Clavicle Bone Region Moderate depletion  ?Clavicle and Acromion Bone Region Moderate depletion  ?Scapular Bone Region Moderate depletion  ?Dorsal Hand Moderate depletion  ?Patellar Region Moderate depletion  ?Anterior Thigh Region Moderate depletion  ?Posterior Calf Region Moderate depletion  ? ?  ? ?  ? ? ?Diet Order:   ?Diet Order   ? ?       ?  Diet full liquid Room service appropriate? Yes; Fluid consistency: Thin  Diet effective now       ?  ? ?  ?  ? ?  ? ? ?EDUCATION NEEDS:  ? ?No education needs have been identified at this time ? ?Skin:  Skin Assessment: Skin Integrity Issues: ?Skin Integrity Issues:: Incisions ?Incisions: abdomen ? ?Last BM:  4/25 ? ?Height:  ? ?Ht Readings from Last 1 Encounters:  ?  05/31/21 5\' 11"  (1.803 m)  ? ? ?Weight:  ? ?Wt Readings from Last 1 Encounters:  ?05/31/21 70.1 kg  ? ? ?Ideal Body Weight:  78.2 kg ? ?BMI:  Body mass index is 21.55 kg/m?. ? ?Estimated Nutritional Needs:  ? ?Kcal:  2000-2200 kcal/d ? ?Protein:  100-110 g/d ? ?Fluid:  >/=2.2 L/d ? ? ?Kerman Passey MS, RDN, LDN, CNSC ?Registered Dietitian III ?Clinical Nutrition ?RD Pager and On-Call Pager Number Located in [place]  ? ?

## 2021-06-01 NOTE — Progress Notes (Signed)
? ?[facility] ?PROGRESS NOTE ? ? ?Justin Yang [id number] DOB: 04-01-1955 DOA: 05/19/2021 ? ?PCP: Patient, No Pcp Per (Inactive) ? ?Brief History/Interval Summary: 66 y.o. male with medical history significant for T2DM, hyperlipidemia, chronic microcytic anemia, history of SBO requiring lysis of adhesions who presented to the ED for evaluation of nausea and vomiting.  Patient was found to have high-grade small bowel obstruction.  General surgery was consulted.  After several days of observation patient subsequently underwent laparotomy with lysis of adhesions and small bowel resection on 4/18. ?Subsequently developed fever on 4/25 reason for which is not clear. ? ? ?Consultants: General surgery ? ?Procedures: Laparotomy with lysis of adhesions and small bowel resection on 4/18 ? ? ? ?Subjective/Interval History: ?Denies any abdominal pain at this time.  Concerned about his fever.  His wife is at the bedside. ? ? ? ? ?Assessment/Plan: ? ?Small bowel obstruction ?Status post laparotomy with lysis of adhesions and small bowel resection. ?General surgery is following and managing.   ?Remains on TPN.   ? ?Fever ?Etiology unclear.  CT abdomen pelvis does not show any abscess.  UA was unremarkable.  Chest x-ray does not show any infiltrates.  No significant cough.  Proceed with lower extremity Doppler study.   ?WBC is noted to be quite elevated. ?CT did show significant constipation with ileus.  No diarrhea.  C. difficile unlikely. ?No evidence for cellulitis noted. ?Patient empirically started on Zosyn.  Follow-up on cultures. ? ?Microcytic anemia ?Mild drop in hemoglobin noted.  He is feeling fatigued.  We will transfuse him 1 unit of PRBC. ? ?Acute kidney injury ?Received IV fluids with improvement.  Monitor urine output.  Avoid nephrotoxic agent. ? ?Hyperkalemia ?Was supplemented potassium a few days ago.  Potassium noted to be 5.5.  Discussed with pharmacy.  We will hold his TPN for today.  Recheck labs  tomorrow.  Also given Lokelma. ? ?Diabetes mellitus type 2 ?Holding metformin.  Monitor CBGs. ? ?Hyperlipidemia ?Holding statin. ? ?DVT Prophylaxis: Subcutaneous heparin ?Code Status: Full code ?Family Communication: Discussed with patient and his wife ?Disposition Plan: Hopefully return home in improved ? ?Status is: Inpatient ?Remains inpatient appropriate because: Fever, recovery from small bowel obstruction ? ? ? ? ? ?Medications: Scheduled: ? sodium chloride   Intravenous Once  ? acetaminophen  1,000 mg Oral Q8H  ? chlorhexidine  15 mL Mouth Rinse BID  ? Chlorhexidine Gluconate Cloth  6 each Topical Daily  ? feeding supplement (GLUCERNA SHAKE)  237 mL Oral TID BM  ? heparin  5,000 Units Subcutaneous Q8H  ? mouth rinse  15 mL Mouth Rinse q12n4p  ? methocarbamol  500 mg Oral Q6H  ? pantoprazole (PROTONIX) IV  40 mg Intravenous Q24H  ? sodium bicarbonate  50 mEq Intravenous Once  ? sodium chloride flush  10-40 mL Intracatheter Q12H  ? sodium phosphate  1 enema Rectal Once  ? ?Continuous: ? sodium chloride 30 mL/hr at 06/01/21 1100  ? dextrose 10 % and 0.45 % NaCl    ? piperacillin-tazobactam (ZOSYN)  IV Stopped (06/01/21 0553)  ? TPN ADULT (ION)    ? ?[id number], HYDROmorphone (DILAUDID) injection, ondansetron **OR** ondansetron (ZOFRAN) IV, oxyCODONE, phenol, sodium chloride flush ? ?Antibiotics: ?Anti-infectives (From admission, onward)  ? ? Start     Dose/Rate Route Frequency Ordered Stop  ? 05/28/21 1215  piperacillin-tazobactam (ZOSYN) IVPB 3.375 g       ? 3.375 g ?12.5 mL/hr over 240 Minutes Intravenous Every 8 hours 05/28/21 1125    ? ?  ? ? ?  Objective: ? ?Vital Signs ? ?Vitals:  ? 05/31/21 2135 05/31/21 2254 06/01/21 0300 06/01/21 0856  ?BP:  114/76 119/73 113/70  ?Pulse: 97 98 92 (!) 101  ?Resp:  18 18 20   ?Temp: 99.4 ?F (37.4 ?C) 99.6 ?F (37.6 ?C) (!) 97.5 ?F (36.4 ?C) 100.1 ?F (37.8 ?C)  ?TempSrc: Oral Oral Oral Oral  ?SpO2: 97% 97% 98% 96%  ?Weight:      ?Height:      ? ? ?Intake/Output Summary  (Last 24 hours) at 06/01/2021 1206 ?Last data filed at 06/01/2021 1100 ?Gross per 24 hour  ?Intake 4676.9 ml  ?Output 300 ml  ?Net 4376.9 ml  ? ?Filed Weights  ? 05/25/21 1127 05/29/21 0617 05/31/21 0500  ?Weight: 71 kg 56.1 kg 70.1 kg  ? ? ?General appearance: Awake alert.  In no distress ?Resp: Clear to auscultation bilaterally.  Normal effort ?Cardio: S1-S2 is normal regular.  No S3-S4.  No rubs murmurs or bruit ?GI: Dressing noted over the abdomen.  No obvious purulent drainage noted from the incision site. ?Extremities: No edema.  Full range of motion of lower extremities. ?Neurologic: Alert and oriented x3.  No focal neurological deficits.  ? ? ?Lab Results: ? ?Data Reviewed: I have personally reviewed following labs and reports of the imaging studies ? ?CBC: ?Recent Labs  ?Lab 05/28/21 ?[id number] 05/29/21 ?[id number] 05/30/21 ?0425 05/31/21 ?0159 06/01/21 ?[id number]  ?WBC 9.9 12.0* 15.0* 15.5* 20.0*  ?NEUTROABS 8.6* 9.3* 10.9* 13.8* 16.8*  ?HGB 7.7* 8.6* 7.7* 7.7* 6.9*  ?HCT 26.2* 29.0* 26.5* 24.8* 22.5*  ?MCV 77.1* 76.1* 76.6* 74.9* 75.8*  ?PLT 211 228 176 199 196  ? ? ?Basic Metabolic Panel: ?Recent Labs  ?Lab 05/26/21 ?[id number] 05/27/21 ?0502 05/28/21 ?[id number] 05/29/21 ?[id number] 05/30/21 ?0425 05/31/21 ?0159 06/01/21 ?[id number]  ?NA 137 140 146* 150* 136 133* 135  ?K 3.8 3.4* 3.6 3.7 4.2 5.1 5.5*  ?CL 101 100 97* 91* 94* 100 108  ?CO2 30 32 38* >45* 33* 26 21*  ?GLUCOSE 141* 187* 216* 195* 164* 111* 179*  ?BUN 27* 28* 30* 46* 45* 39* 38*  ?CREATININE 1.40* 1.36* 1.35* 1.70* 1.72* 1.51* 1.56*  ?CALCIUM 7.9* 8.2* 8.6* 9.1 8.4* 8.4* 8.2*  ?MG 2.3 2.3 2.3  --  2.1  --  2.2  ?PHOS 1.8* 2.4* 4.3  --  3.2  --  3.8  ? ? ?GFR: ?Estimated Creatinine Clearance: 45.6 mL/min (A) (by C-G formula based on SCr of 1.56 mg/dL (H)). ? ?Liver Function Tests: ?Recent Labs  ?Lab 05/26/21 ?[id number] 05/27/21 ?0502 05/28/21 ?[id number] 05/30/21 ?0425  ?AST 19 16 15 19   ?ALT 12 10 11 13   ?ALKPHOS 44 46 53 69  ?BILITOT 0.4 0.7 0.6 0.5  ?PROT 5.4* 5.9* 6.4* 6.9  ?ALBUMIN 2.4*  2.4* 2.4* 2.3*  ? ? ? ?CBG: ?Recent Labs  ?Lab 05/31/21 ?[id number] 05/31/21 ?0758 05/31/21 ?1135 05/31/21 ?2054 06/01/21 ?0735  ?GLUCAP 133* 116* 190* 167* 169*  ? ? ?Lipid Profile: ?Recent Labs  ?  05/30/21 ?0425  ?TRIG 169*  ? ? ? ?Recent Results (from the past 240 hour(s))  ?Urine Culture     Status: None  ? Collection Time: 05/31/21  8:58 AM  ? Specimen: Urine, Clean Catch  ?Result Value Ref Range Status  ? Specimen Description URINE, CLEAN CATCH  Final  ? Special Requests NONE  Final  ? Culture   Final  ?  NO GROWTH ?Performed at [facility], [place] [address], [place], [place] [address] ?  ? Report  Status 06/01/2021 FINAL  Final  ?Culture, blood (Routine X 2) w Reflex to ID Panel     Status: None (Preliminary result)  ? Collection Time: 05/31/21  9:00 AM  ? Specimen: BLOOD  ?Result Value Ref Range Status  ? Specimen Description BLOOD BLOOD LEFT HAND  Final  ? Special Requests   Final  ?  BOTTLES DRAWN AEROBIC AND ANAEROBIC Blood Culture adequate volume  ? Culture   Final  ?  NO GROWTH < 24 HOURS ?Performed at [facility], [place] [address], [place], [place] [address] ?  ? Report Status PENDING  Incomplete  ?Culture, blood (Routine X 2) w Reflex to ID Panel     Status: None (Preliminary result)  ? Collection Time: 05/31/21  9:06 AM  ? Specimen: BLOOD  ?Result Value Ref Range Status  ? Specimen Description BLOOD BLOOD RIGHT ARM  Final  ? Special Requests   Final  ?  BOTTLES DRAWN AEROBIC AND ANAEROBIC Blood Culture adequate volume  ? Culture   Final  ?  NO GROWTH < 24 HOURS ?Performed at [facility], [place] [address] [place], [place] [address] ?  ? Report Status PENDING  Incomplete  ?  ? ? ?Radiology Studies: ?CT ABDOMEN PELVIS WO CONTRAST ? ?Result Date: 05/31/2021 ?CLINICAL DATA:  Abdominal pain, postop. EXAM: CT ABDOMEN AND PELVIS WITHOUT CONTRAST TECHNIQUE: Multidetector CT imaging of the abdomen and pelvis was performed following the standard protocol without IV contrast. RADIATION DOSE REDUCTION:  This exam was performed according to the departmental dose-optimization program which includes automated exposure control, adjustment of the mA and/or kV according to patient size and/or use of iterative rec

## 2021-06-02 ENCOUNTER — Inpatient Hospital Stay (HOSPITAL_COMMUNITY): Payer: No Typology Code available for payment source

## 2021-06-02 DIAGNOSIS — R509 Fever, unspecified: Secondary | ICD-10-CM

## 2021-06-02 DIAGNOSIS — K56609 Unspecified intestinal obstruction, unspecified as to partial versus complete obstruction: Secondary | ICD-10-CM | POA: Diagnosis not present

## 2021-06-02 DIAGNOSIS — I82612 Acute embolism and thrombosis of superficial veins of left upper extremity: Secondary | ICD-10-CM

## 2021-06-02 DIAGNOSIS — D509 Iron deficiency anemia, unspecified: Secondary | ICD-10-CM | POA: Diagnosis not present

## 2021-06-02 DIAGNOSIS — E1100 Type 2 diabetes mellitus with hyperosmolarity without nonketotic hyperglycemic-hyperosmolar coma (NKHHC): Secondary | ICD-10-CM | POA: Diagnosis not present

## 2021-06-02 LAB — COMPREHENSIVE METABOLIC PANEL
ALT: 10 U/L (ref 0–44)
AST: 13 U/L — ABNORMAL LOW (ref 15–41)
Albumin: 1.8 g/dL — ABNORMAL LOW (ref 3.5–5.0)
Alkaline Phosphatase: 58 U/L (ref 38–126)
Anion gap: 6 (ref 5–15)
BUN: 29 mg/dL — ABNORMAL HIGH (ref 8–23)
CO2: 22 mmol/L (ref 22–32)
Calcium: 8 mg/dL — ABNORMAL LOW (ref 8.9–10.3)
Chloride: 107 mmol/L (ref 98–111)
Creatinine, Ser: 1.32 mg/dL — ABNORMAL HIGH (ref 0.61–1.24)
GFR, Estimated: 59 mL/min — ABNORMAL LOW (ref >60)
Glucose, Bld: 141 mg/dL — ABNORMAL HIGH (ref 70–99)
Potassium: 4.4 mmol/L (ref 3.5–5.1)
Sodium: 135 mmol/L (ref 135–145)
Total Bilirubin: 0.4 mg/dL (ref 0.3–1.2)
Total Protein: 6.1 g/dL — ABNORMAL LOW (ref 6.5–8.1)

## 2021-06-02 LAB — TYPE AND SCREEN
ABO/RH(D): O POS
Antibody Screen: NEGATIVE
Unit division: 0

## 2021-06-02 LAB — CBC WITH DIFFERENTIAL/PLATELET
Abs Immature Granulocytes: 0.69 10*3/uL — ABNORMAL HIGH (ref 0.00–0.07)
Basophils Absolute: 0 10*3/uL (ref 0.0–0.1)
Basophils Relative: 0 %
Eosinophils Absolute: 0 10*3/uL (ref 0.0–0.5)
Eosinophils Relative: 0 %
HCT: 24.3 % — ABNORMAL LOW (ref 39.0–52.0)
Hemoglobin: 7.4 g/dL — ABNORMAL LOW (ref 13.0–17.0)
Immature Granulocytes: 4 %
Lymphocytes Relative: 6 %
Lymphs Abs: 1 10*3/uL (ref 0.7–4.0)
MCH: 23.9 pg — ABNORMAL LOW (ref 26.0–34.0)
MCHC: 30.5 g/dL (ref 30.0–36.0)
MCV: 78.6 fL — ABNORMAL LOW (ref 80.0–100.0)
Monocytes Absolute: 1.2 10*3/uL — ABNORMAL HIGH (ref 0.1–1.0)
Monocytes Relative: 7 %
Neutro Abs: 14.7 10*3/uL — ABNORMAL HIGH (ref 1.7–7.7)
Neutrophils Relative %: 83 %
Platelets: 210 10*3/uL (ref 150–400)
RBC: 3.09 MIL/uL — ABNORMAL LOW (ref 4.22–5.81)
RDW: 19.8 % — ABNORMAL HIGH (ref 11.5–15.5)
WBC: 17.7 10*3/uL — ABNORMAL HIGH (ref 4.0–10.5)
nRBC: 0 % (ref 0.0–0.2)

## 2021-06-02 LAB — BPAM RBC
Blood Product Expiration Date: 202305282359
ISSUE DATE / TIME: 202304261247
Unit Type and Rh: 5100

## 2021-06-02 LAB — MAGNESIUM: Magnesium: 2 mg/dL (ref 1.7–2.4)

## 2021-06-02 LAB — TRIGLYCERIDES: Triglycerides: 139 mg/dL (ref <150)

## 2021-06-02 LAB — PHOSPHORUS: Phosphorus: 3.4 mg/dL (ref 2.5–4.6)

## 2021-06-02 LAB — GLUCOSE, CAPILLARY
Glucose-Capillary: 160 mg/dL — ABNORMAL HIGH (ref 70–99)
Glucose-Capillary: 128 mg/dL — ABNORMAL HIGH (ref 70–99)

## 2021-06-02 MED ORDER — TRAVASOL 10 % IV SOLN
INTRAVENOUS | Status: DC
  Filled 2021-06-02: qty 550.8

## 2021-06-02 MED ORDER — BISACODYL 10 MG RE SUPP
10.0000 mg | Freq: Once | RECTAL | Status: AC
  Administered 2021-06-02: 10 mg via RECTAL
  Filled 2021-06-02: qty 1

## 2021-06-02 NOTE — Progress Notes (Addendum)
Nutrition Follow-up ? ?DOCUMENTATION CODES:  ? ?Non-severe (moderate) malnutrition in context of chronic illness ? ?INTERVENTION:  ? ?Continue TPN; TPN turned down by half today by Surgery. If po intake does not improve with diet advancement to Soft, recommend increasing TPN back to goal rate given pt with malnutrition.  ?-If wanting to liberalize pt from TPN, could consider Cortrak insertion with supplemental TF if po intake remains poor given gut now functioning.  ?-Recommend continuing some form of nutrition support, TPN vs TF via Cortrak until pt meeting >60% estimated nutritional needs by mouth ? ?Continue Glucerna Shake po TID, each supplement provides 220 kcal and 10 grams of protein  ? ?NUTRITION DIAGNOSIS:  ? ?Moderate Malnutrition related to chronic illness as evidenced by moderate fat depletion, moderate muscle depletion. ? ?Being addressed via diet advancement, supplements ? ?GOAL:  ? ?Patient will meet greater than or equal to 90% of their needs ? ?Progressing ? ?MONITOR:  ? ?PO intake, Supplement acceptance, Labs ? ?REASON FOR ASSESSMENT:  ? ?Malnutrition Screening Tool ?  ? ?ASSESSMENT:  ? ?66 y.o. male with hx of DM type 2, HLD, and hx of SBO presented to ED with nausea and vomiting for ~ 2 weeks. Poor PO during this time. Found to have AKI and imaging suggestive of SBO. ? ?Tolerated FL diet although per report po intake remains minimal. Diet advanced to Soft this AM.  ? ?Pt not available on rounds this AM. RD attempted to call pt later in the day and did not get response.  ? ?Noted surgery decreasing  TPN by half today to see if this will improve/stimulate appetite. RD reached out to Surgery and TPN Pharmacist to discuss.  Concerned as pt is malnourished and ASPEN guidelines does not have recommendations of this and RD team not aware of any research to support this practice.  Plan to decrease TPN today and then if no improvement in po intake, resume TPN at goal. ?Pt did not like may of the items on  the FL diet, dairy and very sweet items, and po intake may improve with diet advancement to Soft which allows many more meal options.  ? ?Potassium improved with treatment and holding of TPN yesterday ? ?Midline incision opened more per Surgery notes, not purulent drainage.  ? ?+BM, no N/V, abd pain improved per report ? ?Labs: potassium 4.4 (wdl) ?Meds: reviewed ? ?Diet Order:   ?Diet Order   ? ?       ?  DIET SOFT Room service appropriate? Yes; Fluid consistency: Thin  Diet effective now       ?  ? ?  ?  ? ?  ? ? ?EDUCATION NEEDS:  ? ?No education needs have been identified at this time ? ?Skin:  Skin Assessment: Skin Integrity Issues: ?Skin Integrity Issues:: Incisions ?Incisions: abdomen ? ?Last BM:  4/25 ? ?Height:  ? ?Ht Readings from Last 1 Encounters:  ?05/31/21 5\' 11"  (1.803 m)  ? ? ?Weight:  ? ?Wt Readings from Last 1 Encounters:  ?05/31/21 70.1 kg  ? ? ?Ideal Body Weight:  78.2 kg ? ?BMI:  Body mass index is 21.55 kg/m?. ? ?Estimated Nutritional Needs:  ? ?Kcal:  2000-2200 kcal/d ? ?Protein:  100-110 g/d ? ?Fluid:  >/=2.2 L/d ? ? ?06/02/21 MS, RDN, LDN, CNSC ?Registered Dietitian III ?Clinical Nutrition ?RD Pager and On-Call Pager Number Located in [place]  ? ?

## 2021-06-02 NOTE — Progress Notes (Signed)
Physical Therapy Treatment ?Patient Details ?Name: Justin Yang ?MRN: [id number] ?DOB: 1955-12-31 ?Today's Date: 06/02/2021 ? ? ?History of Present Illness 66 y/o male presented to ED on 05/19/21 for abdominal cramping and N/V. CT abdomen showed small bowel obstruction. S/p laparotomy with small bowel resection on 4/18. PMH: T2DM, hx of SBO, chronic microcytic anemia. ? ?  ?PT Comments  ? ? Patient progressing towards therapy goals. Patient ambulated this session with no AD and supervision. Able to perform sit to stand x 10 with hands on knees with supervision. Patient complaining of fatigue and requesting to defer further exercise. D/c plan remains appropriate.  ?   ?Recommendations for follow up therapy are one component of a multi-disciplinary discharge planning process, led by the attending physician.  Recommendations may be updated based on patient status, additional functional criteria and insurance authorization. ? ?Follow Up Recommendations ? Home health PT ?  ?  ?Assistance Recommended at Discharge Intermittent Supervision/Assistance  ?Patient can return home with the following A little help with walking and/or transfers;Assist for transportation;Assistance with cooking/housework ?  ?Equipment Recommendations ? None recommended by PT  ?  ?Recommendations for Other Services   ? ? ?  ?Precautions / Restrictions Precautions ?Precautions: Fall ?Precaution Comments: abdominal incision ?Restrictions ?Weight Bearing Restrictions: No  ?  ? ?Mobility ? Bed Mobility ?Overal bed mobility: Modified Independent ?  ?  ?  ?  ?  ?  ?General bed mobility comments: good recall of log roll ?  ? ?Transfers ?Overall transfer level: Needs assistance ?Equipment used: 1 person hand held assist ?Transfers: Sit to/from Stand ?Sit to Stand: Supervision ?  ?  ?  ?  ?  ?  ?  ? ?Ambulation/Gait ?Ambulation/Gait assistance: Supervision ?Gait Distance (Feet): 250 Feet ?Assistive device: IV Pole, None ?Gait Pattern/deviations: Step-through  pattern, Decreased stride length ?Gait velocity: decreased ?  ?  ?General Gait Details: supervision for safety. Initially utilizing IV pole for 86' then progressed to no AD. ? ? ?Stairs ?  ?  ?  ?  ?  ? ? ?Wheelchair Mobility ?  ? ?Modified Rankin (Stroke Patients Only) ?  ? ? ?  ?Balance Overall balance assessment: Mild deficits observed, not formally tested ?  ?  ?  ?  ?  ?  ?  ?  ?  ?  ?  ?  ?  ?  ?  ?  ?  ?  ?  ? ?  ?Cognition Arousal/Alertness: Awake/alert ?Behavior During Therapy: [facility] for tasks assessed/performed ?Overall Cognitive Status: Within Functional Limits for tasks assessed ?  ?  ?  ?  ?  ?  ?  ?  ?  ?  ?  ?  ?  ?  ?  ?  ?  ?  ?  ? ?  ?Exercises Other Exercises ?Other Exercises: sit to stand x 10 with hands on knees ? ?  ?General Comments   ?  ?  ? ?Pertinent Vitals/Pain Pain Assessment ?Pain Assessment: Faces ?Faces Pain Scale: Hurts a little bit ?Pain Location: abdomen ?Pain Descriptors / Indicators: Grimacing ?Pain Intervention(s): Monitored during session, Repositioned  ? ? ?Home Living   ?  ?  ?  ?  ?  ?  ?  ?  ?  ?   ?  ?Prior Function    ?  ?  ?   ? ?PT Goals (current goals can now be found in the care plan section) Acute Rehab PT Goals ?Patient Stated Goal: to go home ?  PT Goal Formulation: With patient ?Time For Goal Achievement: 06/13/21 ?Potential to Achieve Goals: Good ?Progress towards PT goals: Progressing toward goals ? ?  ?Frequency ? ? ? Min 3X/week ? ? ? ?  ?PT Plan Current plan remains appropriate  ? ? ?Co-evaluation   ?  ?  ?  ?  ? ?  ?AM-PAC PT "6 Clicks" Mobility   ?Outcome Measure ? Help needed turning from your back to your side while in a flat bed without using bedrails?: None ?Help needed moving from lying on your back to sitting on the side of a flat bed without using bedrails?: None ?Help needed moving to and from a bed to a chair (including a wheelchair)?: A Little ?Help needed standing up from a chair using your arms (e.g., wheelchair or bedside chair)?: A Little ?Help  needed to walk in hospital room?: A Little ?Help needed climbing 3-5 steps with a railing? : A Little ?6 Click Score: 20 ? ?  ?End of Session   ?Activity Tolerance: Patient tolerated treatment well ?Patient left: in bed;with call bell/phone within reach ?Nurse Communication: Mobility status ?PT Visit Diagnosis: Muscle weakness (generalized) (M62.81) ?  ? ? ?Time: 7253-6644 ?PT Time Calculation (min) (ACUTE ONLY): 24 min ? ?Charges:  $Gait Training: 8-22 mins ?$Therapeutic Exercise: 8-22 mins          ?          ? ?Kimika Streater A. Dan Humphreys, PT, DPT ?Acute Rehabilitation Services ?Pager [phone] ?Office [phone] ? ? ? ?Kimyata Milich A Charnika Herbst ?06/02/2021, 5:39 PM ? ?

## 2021-06-02 NOTE — Progress Notes (Signed)
VASCULAR LAB ? ? ? ?Bilateral upper and lower extremity venous Dopplers have been performed. ? ?See CV proc for preliminary results. ? ? ?Nykole Matos, RVT ?06/02/2021, 10:08 AM ? ?

## 2021-06-02 NOTE — Progress Notes (Signed)
Pt off the floor to vascular.

## 2021-06-02 NOTE — Progress Notes (Signed)
PHARMACY - TOTAL PARENTERAL NUTRITION CONSULT NOTE  ? ?Indication: Small bowel obstruction ? ?Patient Measurements: ?Height: 5\' 11"  (180.3 cm) ?Weight: 70.1 kg (154 lb 8.7 oz) ?IBW/kg (Calculated) : 75.3 ?TPN AdjBW (KG): 70.1 ?Body mass index is 21.55 kg/m?. ?Usual Weight: 81.8 kg ? ?Assessment: [name] with PMH of T2DM, HLD, chronic microcytic anemia, h/o SBO requiring lysis of adhesions (2017) presented to the ED on 4/13 for new onset nausea/vomiting starting on 4/12. KUB on admit was c/w high-grade SBO. Pt was made NPO and NGT was placed for decompression. IVF were initiated. Pt failed to respond to conservative measures and underwent surgery on 4/18 - laparotomy with lysis of adhesions and small bowel resection. ? ?Patient was eating normal diet prior to symptom onset. Pt reports his last meal was bacon, egg, and cheese sandwich from a gas station prior to nausea/vomiting starting on 4/12. Pt has been NPO since admission on 4/13. Pharmacy consulted to initiate and manage TPN therapy on 4/19. ? ?Glucose / Insulin: BG <180, off SSI with 12 units in TPN  ?Electrolytes: Na 135, K 4.4 (received lokelma x1), CoCa 9.76, Phos 3.4 (received fleet enema x1), others wnl  ?Renal: SCr 1.32, BUN 29 ?Hepatic: LFTs wnl, albumin 1.8. TG \\139  ?Intake / Output; MIVF: NS @30ml /hr.  UOP incompletely charted. LBM 4/25.  Passing flatus   ?GI Imaging:  ?4/14 CT abd: SBP, large stool burden with impacted stool  ?4/14 KUB: high grade SBO    ?4/15 KUB: persistent partial SBO ?4/16 KUB: persistent partial SBO  ?4/17 KUB: stable SBO ?4/18 KUB: no improvement or worsening in small bowel dilatation. ?4/21 KUB: dilated loops of small bowel c/w post-op ileus or recurrent pSBO ?4/25 CT abd: Multiple loops of mildly dilated small bowel, possible ileus vs partial/early SBO  ?GI Surgeries / Procedures:  ?4/18 - laparostomy with lysis of adhesions and small bowel resection ? ?Central access: PICC placed 4/19 ?TPN start date: 05/25/21 ? ?Nutritional  Goals: ?Goal TPN rate is 90 mL/hr (provides 110 g of protein and 2116 kcals per day) ? ?RD Assessment: ?Estimated Needs ?Total Energy Estimated Needs: 2000-2200 kcal/d ?Total Protein Estimated Needs: 100-110 g/d ?Total Fluid Estimated Needs: >/=2.2 L/d ? ?Current Nutrition:  ?TPN + FLD ?4/24 ate: 1 chicken broth, 1 jello, 1 pudding, 1 Boost. Feels a lot of acid reflux every time he eats ?4/25 AM: breakfast tray with grits, ice cream, coffee delivered but patient did not eat anything due to nausea ?4/26: Patient not in room to discuss PO intake. Per team, patient still eating very little but would like to halve TPN to see if PO intake improves.  ? ?Plan:  ?Halve TPN to 45 mL/hr (provides 55 g protein, 1058 kcal, meeting ~50% of pt's estimated needs) ?Electrolytes in TPN: increase  K 8 mEq/L slightly; (adjusted to keep same with rate decrease) Continue  Na 125 mEq/L,Mg 6 mEq/L, Ca to 6 mEq/L, Phos 20  mmol/L, and Cl:Acet 1:2; ?Add standard MVI and trace elements to TPN ?Remove insulin in TPN, check BG Q12 hr ?Additional fluid per MD  ?Monitor TPN labs 4/28 then Mon/Thurs  ?F/u PO tolerance and adjust TPN as needed ? ?5/26, PharmD, BCPS, BCCP ?Clinical Pharmacist ? ?Please check AMION for all [facility] phone numbers ?After 10:00 PM, call Main Pharmacy [phone] ? ?

## 2021-06-02 NOTE — Progress Notes (Signed)
[facility] ?Progress Note ? ?9 Days Post-Op  ?Subjective: ?CC-  ?Feeling better today. Reports less abdominal pain. No n/v. Tolerating full liquids but not taking in a lot. Passing flatus. No BM yesterday but then had about 3 semisolid stools over night. ? ?Objective: ?Vital signs in last 24 hours: ?Temp:  [98.1 ?F (36.7 ?C)-100.2 ?F (37.9 ?C)] 98.3 ?F (36.8 ?C) (04/27 3790) ?Pulse Rate:  [78-99] 88 (04/27 0828) ?Resp:  [15-21] 21 (04/27 2409) ?BP: (110-132)/(66-74) 121/74 (04/27 7353) ?SpO2:  [96 %-99 %] 96 % (04/27 0828) ?Last BM Date : 05/31/21 ? ?Intake/Output from previous day: ?04/26 0701 - 04/27 0700 ?In: 5218.4 [P.O.:960; I.V.:3585.3; Blood:567.1; IV Piggyback:106] ?Out: 100 [Urine:100] ?Intake/Output this shift: ?Total I/O ?In: 120 [P.O.:120] ?Out: -  ? ?PE: ?Gen:  Alert, NAD, pleasant ?Abd: soft, mild distension, mild tenderness over incision, +BS, proximal midline cdi with staples present, distal aspect open with fibrinous tissue at base but no cellulitis and no purulent drainage ? ?Lab Results:  ?Recent Labs  ?  06/01/21 ?0314 06/02/21 ?0420  ?WBC 20.0* 17.7*  ?HGB 6.9* 7.4*  ?HCT 22.5* 24.3*  ?PLT 196 210  ? ?BMET ?Recent Labs  ?  06/01/21 ?0314 06/02/21 ?0420  ?NA 135 135  ?K 5.5* 4.4  ?CL 108 107  ?CO2 21* 22  ?GLUCOSE 179* 141*  ?BUN 38* 29*  ?CREATININE 1.56* 1.32*  ?CALCIUM 8.2* 8.0*  ? ?PT/INR ?No results for input(s): LABPROT, INR in the last 72 hours. ?CMP  ?   ?Component Value Date/Time  ? NA 135 06/02/2021 0420  ? K 4.4 06/02/2021 0420  ? CL 107 06/02/2021 0420  ? CO2 22 06/02/2021 0420  ? GLUCOSE 141 (H) 06/02/2021 0420  ? BUN 29 (H) 06/02/2021 0420  ? CREATININE 1.32 (H) 06/02/2021 0420  ? CALCIUM 8.0 (L) 06/02/2021 0420  ? PROT 6.1 (L) 06/02/2021 0420  ? ALBUMIN 1.8 (L) 06/02/2021 0420  ? AST 13 (L) 06/02/2021 0420  ? ALT 10 06/02/2021 0420  ? ALKPHOS 58 06/02/2021 0420  ? BILITOT 0.4 06/02/2021 0420  ? GFRNONAA 59 (L) 06/02/2021 0420  ? GFRAA >60 07/06/2015 0458  ? ?Lipase  ?    ?Component Value Date/Time  ? LIPASE 34 05/20/2021 0225  ? ? ? ? ? ?Studies/Results: ?No results found. ? ?Anti-infectives: ?Anti-infectives (From admission, onward)  ? ? Start     Dose/Rate Route Frequency Ordered Stop  ? 05/28/21 1215  piperacillin-tazobactam (ZOSYN) IVPB 3.375 g       ? 3.375 g ?12.5 mL/hr over 240 Minutes Intravenous Every 8 hours 05/28/21 1125    ? ?  ? ? ? ?Assessment/Plan ?POD #9 elap/loa/sbr-[place] ?- Advance to soft diet. Wean TPN to 1/2 rate to see if this helps stimulate his appetite.  ?- wound opened more yesterday, no further purulent drainage today. Continue BID packing  ?- WBC slightly down 17.7, TMAX 100.2. Continue fever/leukocytosis work up - UE/LE dopplers pending.  ?  ?ID - Zosyn 4/22>> ?FEN - IVF, 1/2 TPN, soft diet, Boost ?VTE - sq heparin ?Foley - out and voiding ?  ?ABL anemia - Hgb 7.4 ?AKI - Cr 1.32, improving ?DM ?HLD ? ? ? LOS: 13 days  ? ? ?Franne Forts, PA-C ?[facility] ?06/02/2021, 9:19 AM ?Please see Amion for pager number during day hours 7:00am-4:30pm ? ?

## 2021-06-02 NOTE — Progress Notes (Signed)
? ?[facility] ?PROGRESS NOTE ? ? ?Justin Yang WVP:[id number] DOB: [birth year] DOA: 05/19/2021 ? ?PCP: Patient, No Pcp Per (Inactive) ? ?Brief History/Interval Summary: 66 y.o. male with medical history significant for T2DM, hyperlipidemia, chronic microcytic anemia, history of SBO requiring lysis of adhesions who presented to the ED for evaluation of nausea and vomiting.  Patient was found to have high-grade small bowel obstruction.  General surgery was consulted.  After several days of observation patient subsequently underwent laparotomy with lysis of adhesions and small bowel resection on 4/18. ?Subsequently developed fever on 4/25 reason for which is not clear. ? ? ?Consultants: General surgery ? ?Procedures: Laparotomy with lysis of adhesions and small bowel resection on 4/18 ? ? ? ?Subjective/Interval History: ?Patient mentions that he feels well this morning.  Denies any abdominal pain nausea or vomiting.  Did have a low-grade fever early this morning.   ? ? ? ?Assessment/Plan: ? ?Small bowel obstruction ?Status post laparotomy with lysis of adhesions and small bowel resection. ?General surgery is following and managing.   ?Started on TPN.  Diet being advanced slowly.  Anticipate TPN to be discontinued in a few days. ? ?Fever/left cephalic vein superficial vein thrombosis. ?Etiology unclear.  CT abdomen pelvis does not show any abscess.  UA was unremarkable.  Chest x-ray does not show any infiltrates.  No significant cough.  Proceed with lower extremity Doppler study.  WBC noted to be elevated and seems to be better today.  Empirically started on Zosyn. ?Cultures negative so far.   ?Lower extremity Doppler studies without DVT.  Upper extremity Dopplers showed cephalic vein superficial vein thrombosis in the left forearm.  If there is an IV in that site it will need to be removed.  I have reached out to nursing staff.  Could have contributed to fever though it remains unclear. ?Since patient appears to  be relatively asymptomatic and this is a superficial vein thrombosis we will likely not need any anticoagulation going forward.  Can use warm compresses. ? ?Microcytic anemia ?Patient was transfused 1 unit of PRBC yesterday.  Drop in hemoglobin thought to be dilutional.  Hemoglobin has responded to 7.4 today.  Continue to monitor. ? ?Acute kidney injury ?Received IV fluids with improvement.  Monitor urine output.  Avoid nephrotoxic agent. ? ?Hyperkalemia ?Was supplemented potassium a few days ago.  Plus was being given potassium through TPN.  TPN was held yesterday.  Potassium is better today.  Was also given Lokelma yesterday.  ? ?Diabetes mellitus type 2 ?Holding metformin.  Monitor CBGs. ? ?Hyperlipidemia ?Holding statin. ? ?DVT Prophylaxis: Subcutaneous heparin ?Code Status: Full code ?Family Communication: Discussed with patient and his wife ?Disposition Plan: Hopefully return home in improved.  Continue to mobilize.  Home health recommended by PT. ? ?Status is: Inpatient ?Remains inpatient appropriate because: Fever, recovery from small bowel obstruction ? ? ? ? ? ?Medications: Scheduled: ? acetaminophen  1,000 mg Oral Q8H  ? chlorhexidine  15 mL Mouth Rinse BID  ? Chlorhexidine Gluconate Cloth  6 each Topical Daily  ? feeding supplement (GLUCERNA SHAKE)  237 mL Oral TID BM  ? heparin  5,000 Units Subcutaneous Q8H  ? mouth rinse  15 mL Mouth Rinse q12n4p  ? methocarbamol  500 mg Oral Q6H  ? pantoprazole (PROTONIX) IV  40 mg Intravenous Q24H  ? sodium chloride flush  10-40 mL Intracatheter Q12H  ? ?Continuous: ? sodium chloride Stopped (06/01/21 1446)  ? piperacillin-tazobactam (ZOSYN)  IV 3.375 g (06/02/21 0529)  ? TPN  ADULT (ION) 90 mL/hr at 06/01/21 1800  ? ?ZOX:WRUEAVWUJPRN:bisacodyl, HYDROmorphone (DILAUDID) injection, ondansetron **OR** ondansetron (ZOFRAN) IV, oxyCODONE, phenol, sodium chloride flush ? ?Antibiotics: ?Anti-infectives (From admission, onward)  ? ? Start     Dose/Rate Route Frequency Ordered Stop  ?  05/28/21 1215  piperacillin-tazobactam (ZOSYN) IVPB 3.375 g       ? 3.375 g ?12.5 mL/hr over 240 Minutes Intravenous Every 8 hours 05/28/21 1125    ? ?  ? ? ?Objective: ? ?Vital Signs ? ?Vitals:  ? 06/01/21 2051 06/02/21 [id number] 06/02/21 0727 06/02/21 0828  ?BP: 120/68 120/68  121/74  ?Pulse: 78 92  88  ?Resp: 18 16  (!) 21  ?Temp: 98.3 ?F (36.8 ?C) 100.2 ?F (37.9 ?C) 98.5 ?F (36.9 ?C) 98.3 ?F (36.8 ?C)  ?TempSrc: Oral Oral Oral Oral  ?SpO2: 99% 97%  96%  ?Weight:      ?Height:      ? ? ?Intake/Output Summary (Last 24 hours) at 06/02/2021 1038 ?Last data filed at 06/02/2021 0850 ?Gross per 24 hour  ?Intake 5098.41 ml  ?Output 100 ml  ?Net 4998.41 ml  ? ? ?Filed Weights  ? 05/25/21 1127 05/29/21 0617 05/31/21 0500  ?Weight: 71 kg 56.1 kg 70.1 kg  ? ? ?General appearance: Awake alert.  In no distress ?Resp: Clear to auscultation bilaterally.  Normal effort ?Cardio: S1-S2 is normal regular.  No S3-S4.  No rubs murmurs or bruit ?GI: Dressing noted over lower abdomen. ?Extremities: No edema.  Full range of motion of lower extremities. ?Neurologic: Alert and oriented x3.  No focal neurological deficits.  ? ? ? ?Lab Results: ? ?Data Reviewed: I have personally reviewed following labs and reports of the imaging studies ? ?CBC: ?Recent Labs  ?Lab 05/29/21 ?0455 05/30/21 ?0425 05/31/21 ?0159 06/01/21 ?[id number] 06/02/21 ?[id number]  ?WBC 12.0* 15.0* 15.5* 20.0* 17.7*  ?NEUTROABS 9.3* 10.9* 13.8* 16.8* 14.7*  ?HGB 8.6* 7.7* 7.7* 6.9* 7.4*  ?HCT 29.0* 26.5* 24.8* 22.5* 24.3*  ?MCV 76.1* 76.6* 74.9* 75.8* 78.6*  ?PLT 228 176 199 196 210  ? ? ? ?Basic Metabolic Panel: ?Recent Labs  ?Lab 05/27/21 ?0502 05/28/21 ?[id number] 05/29/21 ?[id number] 05/30/21 ?0425 05/31/21 ?0159 06/01/21 ?[id number] 06/02/21 ?[id number]  ?NA 140 146* 150* 136 133* 135 135  ?K 3.4* 3.6 3.7 4.2 5.1 5.5* 4.4  ?CL 100 97* 91* 94* 100 108 107  ?CO2 32 38* >45* 33* 26 21* 22  ?GLUCOSE 187* 216* 195* 164* 111* 179* 141*  ?BUN 28* 30* 46* 45* 39* 38* 29*  ?CREATININE 1.36* 1.35* 1.70* 1.72* 1.51*  1.56* 1.32*  ?CALCIUM 8.2* 8.6* 9.1 8.4* 8.4* 8.2* 8.0*  ?MG 2.3 2.3  --  2.1  --  2.2 2.0  ?PHOS 2.4* 4.3  --  3.2  --  3.8 3.4  ? ? ? ?GFR: ?Estimated Creatinine Clearance: 53.8 mL/min (A) (by C-G formula based on SCr of 1.32 mg/dL (H)). ? ?Liver Function Tests: ?Recent Labs  ?Lab 05/27/21 ?0502 05/28/21 ?[id number] 05/30/21 ?0425 06/02/21 ?[id number]  ?AST 16 15 19  13*  ?ALT 10 11 13 10   ?ALKPHOS 46 53 69 58  ?BILITOT 0.7 0.6 0.5 0.4  ?PROT 5.9* 6.4* 6.9 6.1*  ?ALBUMIN 2.4* 2.4* 2.3* 1.8*  ? ? ? ? ?CBG: ?Recent Labs  ?Lab 05/31/21 ?1135 05/31/21 ?2054 06/01/21 ?[id number] 06/01/21 ?2053 06/02/21 ?[id number]  ?GLUCAP 190* 167* 169* 146* 160*  ? ? ? ?Lipid Profile: ?Recent Labs  ?  06/02/21 ?0420  ?TRIG 139  ? ? ? ? ?Recent Results (from the  past 240 hour(s))  ?Urine Culture     Status: None  ? Collection Time: 05/31/21  8:58 AM  ? Specimen: Urine, Clean Catch  ?Result Value Ref Range Status  ? Specimen Description URINE, CLEAN CATCH  Final  ? Special Requests NONE  Final  ? Culture   Final  ?  NO GROWTH ?Performed at [facility], [address] [place], [place] [address] ?  ? Report Status 06/01/2021 FINAL  Final  ?Culture, blood (Routine X 2) w Reflex to ID Panel     Status: None (Preliminary result)  ? Collection Time: 05/31/21  9:00 AM  ? Specimen: BLOOD  ?Result Value Ref Range Status  ? Specimen Description BLOOD BLOOD LEFT HAND  Final  ? Special Requests   Final  ?  BOTTLES DRAWN AEROBIC AND ANAEROBIC Blood Culture adequate volume  ? Culture   Final  ?  NO GROWTH 2 DAYS ?Performed at [facility], [address] [place], [place] [address] ?  ? Report Status PENDING  Incomplete  ?Culture, blood (Routine X 2) w Reflex to ID Panel     Status: None (Preliminary result)  ? Collection Time: 05/31/21  9:06 AM  ? Specimen: BLOOD  ?Result Value Ref Range Status  ? Specimen Description BLOOD BLOOD RIGHT ARM  Final  ? Special Requests   Final  ?  BOTTLES DRAWN AEROBIC AND ANAEROBIC Blood Culture adequate volume  ? Culture    Final  ?  NO GROWTH 2 DAYS ?Performed at [facility], [address], [place], [place] [address] ?  ? Report Status PENDING  Incomplete  ? ?  ? ? ?Radiology Studies: ?VAS [place] LOWER EXTREMITY VENOUS (DVT

## 2021-06-03 DIAGNOSIS — K56609 Unspecified intestinal obstruction, unspecified as to partial versus complete obstruction: Secondary | ICD-10-CM | POA: Diagnosis not present

## 2021-06-03 LAB — CBC WITH DIFFERENTIAL/PLATELET
Abs Immature Granulocytes: 0.55 10*3/uL — ABNORMAL HIGH (ref 0.00–0.07)
Basophils Absolute: 0 10*3/uL (ref 0.0–0.1)
Basophils Relative: 0 %
Eosinophils Absolute: 0 10*3/uL (ref 0.0–0.5)
Eosinophils Relative: 0 %
HCT: 24.6 % — ABNORMAL LOW (ref 39.0–52.0)
Hemoglobin: 7.4 g/dL — ABNORMAL LOW (ref 13.0–17.0)
Immature Granulocytes: 3 %
Lymphocytes Relative: 7 %
Lymphs Abs: 1.2 10*3/uL (ref 0.7–4.0)
MCH: 23.9 pg — ABNORMAL LOW (ref 26.0–34.0)
MCHC: 30.1 g/dL (ref 30.0–36.0)
MCV: 79.4 fL — ABNORMAL LOW (ref 80.0–100.0)
Monocytes Absolute: 1.4 10*3/uL — ABNORMAL HIGH (ref 0.1–1.0)
Monocytes Relative: 8 %
Neutro Abs: 13.3 10*3/uL — ABNORMAL HIGH (ref 1.7–7.7)
Neutrophils Relative %: 82 %
Platelets: 265 10*3/uL (ref 150–400)
RBC: 3.1 MIL/uL — ABNORMAL LOW (ref 4.22–5.81)
RDW: 20.7 % — ABNORMAL HIGH (ref 11.5–15.5)
WBC: 16.4 10*3/uL — ABNORMAL HIGH (ref 4.0–10.5)
nRBC: 0 % (ref 0.0–0.2)

## 2021-06-03 LAB — BASIC METABOLIC PANEL
Anion gap: 5 (ref 5–15)
BUN: 23 mg/dL (ref 8–23)
CO2: 24 mmol/L (ref 22–32)
Calcium: 8.1 mg/dL — ABNORMAL LOW (ref 8.9–10.3)
Chloride: 105 mmol/L (ref 98–111)
Creatinine, Ser: 1.31 mg/dL — ABNORMAL HIGH (ref 0.61–1.24)
GFR, Estimated: 60 mL/min — ABNORMAL LOW (ref >60)
Glucose, Bld: 135 mg/dL — ABNORMAL HIGH (ref 70–99)
Potassium: 3.9 mmol/L (ref 3.5–5.1)
Sodium: 134 mmol/L — ABNORMAL LOW (ref 135–145)

## 2021-06-03 LAB — PHOSPHORUS: Phosphorus: 3.5 mg/dL (ref 2.5–4.6)

## 2021-06-03 LAB — MAGNESIUM: Magnesium: 1.9 mg/dL (ref 1.7–2.4)

## 2021-06-03 LAB — GLUCOSE, CAPILLARY: Glucose-Capillary: 118 mg/dL — ABNORMAL HIGH (ref 70–99)

## 2021-06-03 MED ORDER — ONDANSETRON HCL 4 MG PO TABS: 4.0000 mg | ORAL_TABLET | Freq: Four times a day (QID) | ORAL | 0 refills | Status: AC | PRN

## 2021-06-03 MED ORDER — OXYCODONE HCL 10 MG PO TABS: 5.0000 mg | ORAL_TABLET | Freq: Four times a day (QID) | ORAL | 0 refills | Status: AC | PRN

## 2021-06-03 MED ORDER — AMOXICILLIN-POT CLAVULANATE 875-125 MG PO TABS: 1.0000 | ORAL_TABLET | Freq: Two times a day (BID) | ORAL | 0 refills | Status: AC

## 2021-06-03 MED ORDER — ONDANSETRON HCL 4 MG/2ML IJ SOLN
4.0000 mg | Freq: Four times a day (QID) | INTRAMUSCULAR | 0 refills | Status: AC | PRN
Start: 2021-06-03 — End: ?

## 2021-06-03 MED ORDER — ACETAMINOPHEN 500 MG PO TABS: 500.0000 mg | ORAL_TABLET | Freq: Three times a day (TID) | ORAL | 0 refills | Status: AC | PRN

## 2021-06-03 MED ORDER — BISACODYL 10 MG RE SUPP: 10.0000 mg | Freq: Every day | RECTAL | 0 refills | Status: AC | PRN

## 2021-06-03 MED ORDER — HEPARIN SOD (PORK) LOCK FLUSH 100 UNIT/ML IV SOLN
250.0000 [IU] | INTRAVENOUS | Status: AC | PRN
  Administered 2021-06-03: 250 [IU]
  Filled 2021-06-03: qty 2.5

## 2021-06-03 MED ORDER — METHOCARBAMOL 500 MG PO TABS: 500.0000 mg | ORAL_TABLET | Freq: Three times a day (TID) | ORAL | 0 refills | Status: AC | PRN

## 2021-06-03 NOTE — Progress Notes (Signed)
[facility] ?Progress Note ? ?10 Days Post-Op  ?Subjective: ?CC-  ?Feels great. Tolerating diet. Ate about 25% of his meals plus Glucerna x3. Had a big BM yesterday. Denies n/v. No further fevers. Wants to go home. ? ?Objective: ?Vital signs in last 24 hours: ?Temp:  [98.1 ?F (36.7 ?C)-99.3 ?F (37.4 ?C)] 98.7 ?F (37.1 ?C) (04/28 0347) ?Pulse Rate:  [86-96] 86 (04/28 0523) ?Resp:  [17-20] 17 (04/28 0523) ?BP: (123-183)/(67-78) 136/78 (04/28 0523) ?SpO2:  [96 %-98 %] 98 % (04/28 0523) ?Weight:  [69.7 kg] 69.7 kg (04/28 0523) ?Last BM Date : 05/31/21 ? ?Intake/Output from previous day: ?04/27 0701 - 04/28 0700 ?In: 3237.9 [P.O.:1195; I.V.:1898.9; IV Piggyback:144] ?Out: 200 [Urine:200] ?Intake/Output this shift: ?No intake/output data recorded. ? ?PE: ?Gen:  Alert, NAD, pleasant ?Abd: soft, mild distension, nontender, +BS, proximal midline cdi with staples present, distal aspect open with fibrinous tissue at base but no cellulitis and no purulent drainage  ? ?Lab Results:  ?Recent Labs  ?  06/02/21 ?0420 06/03/21 ?4259  ?WBC 17.7* 16.4*  ?HGB 7.4* 7.4*  ?HCT 24.3* 24.6*  ?PLT 210 265  ? ?BMET ?Recent Labs  ?  06/02/21 ?0420 06/03/21 ?5638  ?NA 135 134*  ?K 4.4 3.9  ?CL 107 105  ?CO2 22 24  ?GLUCOSE 141* 135*  ?BUN 29* 23  ?CREATININE 1.32* 1.31*  ?CALCIUM 8.0* 8.1*  ? ?PT/INR ?No results for input(s): LABPROT, INR in the last 72 hours. ?CMP  ?   ?Component Value Date/Time  ? NA 134 (L) 06/03/2021 0513  ? K 3.9 06/03/2021 0513  ? CL 105 06/03/2021 0513  ? CO2 24 06/03/2021 0513  ? GLUCOSE 135 (H) 06/03/2021 0513  ? BUN 23 06/03/2021 0513  ? CREATININE 1.31 (H) 06/03/2021 0513  ? CALCIUM 8.1 (L) 06/03/2021 0513  ? PROT 6.1 (L) 06/02/2021 0420  ? ALBUMIN 1.8 (L) 06/02/2021 0420  ? AST 13 (L) 06/02/2021 0420  ? ALT 10 06/02/2021 0420  ? ALKPHOS 58 06/02/2021 0420  ? BILITOT 0.4 06/02/2021 0420  ? GFRNONAA 60 (L) 06/03/2021 0513  ? GFRAA >60 07/06/2015 0458  ? ?Lipase  ?   ?Component Value Date/Time  ? LIPASE  34 05/20/2021 0225  ? ? ? ? ? ?Studies/Results: ?VAS [place] LOWER EXTREMITY VENOUS (DVT) ? ?Result Date: 06/02/2021 ? Lower Venous DVT Study Patient Name:  Justin Yang  Date of Exam:   06/02/2021 Medical Rec #: [id number]       Accession #:    [id number] Date of Birth: 1956/01/05       Patient Gender: M Patient Age:   14 years Exam Location:  [facility] Procedure:      VAS [place] LOWER EXTREMITY VENOUS (DVT) Referring Phys: Osvaldo Shipper --------------------------------------------------------------------------------  Indications: Fever of unknown origin.  Risk Factors: Status post small bowel obstruction. Comparison Study: No prior study on file Performing Technologist: Sherren Kerns RVS  Examination Guidelines: A complete evaluation includes B-mode imaging, spectral Doppler, color Doppler, and power Doppler as needed of all accessible portions of each vessel. Bilateral testing is considered an integral part of a complete examination. Limited examinations for reoccurring indications may be performed as noted. The reflux portion of the exam is performed with the patient in reverse Trendelenburg.  +---------+---------------+---------+-----------+----------+--------------+ RIGHT    CompressibilityPhasicitySpontaneityPropertiesThrombus Aging +---------+---------------+---------+-----------+----------+--------------+ CFV      Full           Yes      Yes                                 +---------+---------------+---------+-----------+----------+--------------+  SFJ      Full                                                        +---------+---------------+---------+-----------+----------+--------------+ FV Prox  Full                                                        +---------+---------------+---------+-----------+----------+--------------+ FV Mid   Full                                                         +---------+---------------+---------+-----------+----------+--------------+ FV DistalFull                                                        +---------+---------------+---------+-----------+----------+--------------+ PFV      Full                                                        +---------+---------------+---------+-----------+----------+--------------+ POP      Full           Yes      Yes                                 +---------+---------------+---------+-----------+----------+--------------+ PTV      Full                                                        +---------+---------------+---------+-----------+----------+--------------+ PERO     Full                                                        +---------+---------------+---------+-----------+----------+--------------+ Gastroc  Full                                                        +---------+---------------+---------+-----------+----------+--------------+   +---------+---------------+---------+-----------+----------+--------------+ LEFT     CompressibilityPhasicitySpontaneityPropertiesThrombus Aging +---------+---------------+---------+-----------+----------+--------------+ CFV      Full           Yes      Yes                                 +---------+---------------+---------+-----------+----------+--------------+  SFJ      Full                                                        +---------+---------------+---------+-----------+----------+--------------+ FV Prox  Full                                                        +---------+---------------+---------+-----------+----------+--------------+ FV Mid   Full                                                        +---------+---------------+---------+-----------+----------+--------------+ FV DistalFull                                                         +---------+---------------+---------+-----------+----------+--------------+ PFV      Full                                                        +---------+---------------+---------+-----------+----------+--------------+ POP      Full           Yes      Yes                                 +---------+---------------+---------+-----------+----------+--------------+ PTV      Full                                                        +---------+---------------+---------+-----------+----------+--------------+ PERO     Full                                                        +---------+---------------+---------+-----------+----------+--------------+     Summary: BILATERAL: - No evidence of deep vein thrombosis seen in the lower extremities, bilaterally. -No evidence of popliteal cyst, bilaterally.   *See table(s) above for measurements and observations. Electronically signed by Heath Larkhomas Hawken on 06/02/2021 at 5:01:06 PM.    Final   ? ?VAS US UPPER EXTREMITY VENOUS DUPLEX ? ?Result Date: 06/02/2021 ?UPPER VENOUS STUDY  Patient Name:  Justin Yang  Date of Exam:   06/02/2021 Medical Rec #: [id number]       Accession #:    [id number] Date of Birth: September 20, 1955       Patient Gender: M Patient Age:   [age]  years Exam Location:  [facility] Procedure:      VAS [place] UPPER EXTREMITY VENOUS DUPLEX Referring Phys: Osvaldo Shipper --------------------------------------------------------------------------------  Indications: Fever of unknown origin Risk Factors: Status post small bowel obstruction. Limitations: PICC line and bandages in right upper arm. Comparison Study: No prior study Performing Technologist: Sherren Kerns RVS  Examination Guidelines: A complete evaluation includes B-mode imaging, spectral Doppler, color Doppler, and power Doppler as needed of all accessible portions of each vessel. Bilateral testing is considered an integral part of a complete examination. Limited examinations for  reoccurring indications may be performed as noted.  Right Findings: +----------+------------+---------+-----------+----------+-------+ RIGHT     CompressiblePhasicitySpontaneousPropertiesSummary +----------+------------+---------+-----------+----------+-------+ IJV           Full       Yes       Yes                      +----------+------------+---------+-----------+----------+-------+ Subclavian               Yes       Yes                      +----------+-----------

## 2021-06-03 NOTE — Progress Notes (Addendum)
PICC removed per protocol.  Manual pressure held x48minutes vaseline and folded 4x4 guaze pressure dressing on.  No bleeding noted.  Instructed patient and family member to leave dressing on x24 hours, do not get wet, what to do if bleeding occurs via teach back method.  Patient aware that he is to remain in bed for 30 minutes post removal.  Candice RN made aware as well that OOB time is 1300. ?

## 2021-06-03 NOTE — TOC Transition Note (Signed)
Transition of Care (TOC) - CM/SW Discharge Note ? ? ?Patient Details  ?Name: Justin Yang ?MRN: [id number] ?Date of Birth: Dec 28, 1955 ? ?Transition of Care (TOC) CM/SW Contact:  ?Tom-Johnson, Hershal Coria, RN ?Phone Number: ?06/03/2021, 10:14 AM ? ? ?Clinical Narrative:    ? ?Patient is scheduled for discharge today. Home health PT/OT referral with [facility] and info on AVS. DME delivered to patient at bedside and wife took home. Denies any other TOC needs. Wife to transport at discharge. No further TOC needs noted. ? ?Final next level of care: Home w Home Health Services ?Barriers to Discharge: Barriers Resolved ? ? ?Patient Goals and CMS Choice ?Patient states their goals for this hospitalization and ongoing recovery are:: To return home ?CMS Medicare.gov Compare Post Acute Care list provided to:: Patient ?Choice offered to / list presented to : Patient, Spouse ? ?Discharge Placement ?  ?           ?  ?Patient to be transferred to facility by: Wife ?  ?  ? ?Discharge Plan and Services ?  ?Discharge Planning Services: CM Consult ?Post Acute Care Choice: Home Health          ?DME Arranged: 3-N-1, Walker rolling ?DME Agency: [employer] ?Date DME Agency Contacted: 05/30/21 ?Time DME Agency Contacted: 1525 ?Representative spoke with at DME Agency: Leavy Cella ?HH Arranged: PT, OT ?HH Agency: [facility] ?Date HH Agency Contacted: 05/30/21 ?Time HH Agency Contacted: 1328 ?Representative spoke with at [facility] Agency: Candise Bowens ? ?Social Determinants of Health (SDOH) Interventions ?  ? ? ?Readmission Risk Interventions ?   ? View : No data to display.  ?  ?  ?  ? ? ? ? ? ?

## 2021-06-03 NOTE — Discharge Instructions (Signed)
Wet to Dry WOUND CARE: - Change dressing twice daily - Supplies: sterile saline, kerlex, scissors, ABD pads, tape  Remove dressing and all packing carefully, moistening with sterile saline as needed to avoid packing/internal dressing sticking to the wound. 2.   Clean edges of skin around the wound with water/gauze, making sure there is no tape debris or leakage left on skin that could cause skin irritation or breakdown. 3.   Dampen and clean kerlex with sterile saline and pack wound from wound base to skin level, making sure to take note of any possible areas of wound tracking, tunneling and packing appropriately. Wound can be packed loosely. Trim kerlex to size if a whole kerlex is not required. 4.   Cover wound with a dry ABD pad and secure with tape.  5.   Write the date/time on the dry dressing/tape to better track when the last dressing change occurred. - apply any skin protectant/powder if recommended by clinician to protect skin/skin folds. - change dressing as needed if leakage occurs, wound gets contaminated, or patient requests to shower. - You may shower daily with wound open and following the shower the wound should be dried and a clean dressing placed.  - Medical grade tape as well as packing supplies can be found at [facility] on [address] or [facility] on [address]. The remaining supplies can be found at your local drug store, [employer] etc.  [facility]      [facility] [phone]  OPEN ABDOMINAL SURGERY: POST OP INSTRUCTIONS  Always review your discharge instruction sheet given to you by the facility where your surgery was performed.  IF YOU HAVE DISABILITY OR FAMILY LEAVE FORMS, YOU MUST BRING THEM TO THE OFFICE FOR PROCESSING.  PLEASE DO NOT GIVE THEM TO YOUR DOCTOR.  A prescription for pain medication may be given to you upon discharge.  Take your pain medication as prescribed, if needed.  If narcotic pain medicine is not needed,  then you may take acetaminophen (Tylenol) or ibuprofen (Advil) as needed. Take your usually prescribed medications unless otherwise directed. If you need a refill on your pain medication, please contact your pharmacy. They will contact our office to request authorization.  Prescriptions will not be filled after 5pm or on week-ends. You should follow a light diet the first few days after arrival home, such as soup and crackers, pudding, etc.unless your doctor has advised otherwise. A high-fiber, low fat diet can be resumed as tolerated.   Be sure to include lots of fluids daily. Most patients will experience some swelling and bruising on the chest and neck area.  Ice packs will help.  Swelling and bruising can take several days to resolve Most patients will experience some swelling and bruising in the area of the incision. Ice pack will help. Swelling and bruising can take several days to resolve..  It is common to experience some constipation if taking pain medication after surgery.  Increasing fluid intake and taking a stool softener will usually help or prevent this problem from occurring.  A mild laxative (Milk of Magnesia or Miralax) should be taken according to package directions if there are no bowel movements after 48 hours.  ACTIVITIES:  You may resume regular (light) daily activities beginning the next day--such as daily self-care, walking, climbing stairs--gradually increasing activities as tolerated.  You may have sexual intercourse when it is comfortable.  Refrain from any heavy lifting or straining until approved by your doctor. You may drive   when you no longer are taking prescription pain medication, you can comfortably wear a seatbelt, and you can safely maneuver your car and apply brakes You should see your doctor in the office for a follow-up appointment approximately two weeks after your surgery.  Make sure that you call for this appointment within a day or two after you arrive home to  insure a convenient appointment time.  WHEN TO CALL YOUR DOCTOR: Fever over 101.0 Inability to urinate Nausea and/or vomiting Extreme swelling or bruising Continued bleeding from incision. Increased pain, redness, or drainage from the incision. Difficulty swallowing or breathing Muscle cramping or spasms. Numbness or tingling in hands or feet or around lips.  The clinic staff is available to answer your questions during regular business hours.  Please don't hesitate to call and ask to speak to one of the nurses if you have concerns.  For further questions, please visit [email or web address]   

## 2021-06-03 NOTE — Discharge Summary (Signed)
?[facility] ? ?Physician Discharge Summary  ? ?Patient ID: ?Justin Yang ?MRN: [id number] ?DOB/AGE: 1955/11/19 66 y.o. ? ?Admit date: 05/19/2021 ?Discharge date: 06/03/2021   ? ?PCP: Patient, No Pcp Per (Inactive) ? ?DISCHARGE DIAGNOSES:  ?Small bowel obstruction s/p surgery ?Left cephalic vein superficial vein thrombosis ?Microcytic anemia ?Acute kidney injury, resolved ?Diabetes mellitus type 2 ?Hyperlipidemia ? ?RECOMMENDATIONS FOR OUTPATIENT FOLLOW UP: ?Outpatient follow-up with general surgery as per their recommendation ? ? ?Home Health: PT OT ?Equipment/Devices: 3 and 1 ? ?CODE STATUS: Full code ? ?DISCHARGE CONDITION: fair ? ?Diet recommendation: As recommended by general surgery ? ?INITIAL HISTORY: ?66 y.o. male with medical history significant for T2DM, hyperlipidemia, chronic microcytic anemia, history of SBO requiring lysis of adhesions who presented to the ED for evaluation of nausea and vomiting.  Patient was found to have high-grade small bowel obstruction.  General surgery was consulted.  After several days of observation patient subsequently underwent laparotomy with lysis of adhesions and small bowel resection on 4/18. ?Subsequently developed fever on 4/25 reason for which is not clear. ?  ?Consultants: General surgery ?  ?Procedures: Laparotomy with lysis of adhesions and small bowel resection on 4/18 ? ? ?HOSPITAL COURSE:  ? ?Small bowel obstruction/surgical wound infection ?Status post laparotomy with lysis of adhesions and small bowel resection. ?Patient was started on TPN.  Diet was slowly advanced.  Seems to be doing much better.  Cleared by general surgery for discharge. ?Fever thought to be secondary to perhaps a mild wound infection which appears to have resolved.  Patient was transitioned to oral antibiotics by general surgery.   ?  ?Fever/left cephalic vein superficial vein thrombosis. ?Etiology unclear.  CT abdomen pelvis does not show any abscess.  UA was unremarkable.  Chest  x-ray does not show any infiltrates.  No significant cough.  Proceed with lower extremity Doppler study.  WBC noted to be elevated and seems to be better today.  Empirically started on Zosyn. ?Cultures negative so far.   ?Lower extremity Doppler studies without DVT.  Upper extremity Dopplers showed cephalic vein superficial vein thrombosis in the left forearm.  The IV in that site was removed.  Warm compresses.  No clear indication for anticoagulation since this is a superficial vein.  He is not symptomatic at all. ? ?Microcytic anemia ?Patient was transfused 1 unit of PRBC .  Drop in hemoglobin thought to be dilutional.  Hemoglobin has responded and stable. ? ?Acute kidney injury ?Received IV fluids with improvement.   ? ?Hyperkalemia ?Likely due to potassium in the TPN.  Had to be given [facility] with improvement.   ?  ?Diabetes mellitus type 2 ?  ?Hyperlipidemia ? ?Moderate protein calorie monitor ?Nutrition Problem: Moderate Malnutrition ?Etiology: chronic illness ?Signs/Symptoms: moderate fat depletion, moderate muscle depletion ?Interventions: Glucerna shake, MVI, Liberalize Diet, TPN, Refer to RD note for recommendations ? ?Patient is stable.  Okay for discharge home today.  Cleared by general surgery. ? ? ?PERTINENT LABS: ? ?The results of significant diagnostics from this hospitalization (including imaging, microbiology, ancillary and laboratory) are listed below for reference.   ? ?Microbiology: ?Recent Results (from the past 240 hour(s))  ?Urine Culture     Status: None  ? Collection Time: 05/31/21  8:58 AM  ? Specimen: Urine, Clean Catch  ?Result Value Ref Range Status  ? Specimen Description URINE, CLEAN CATCH  Final  ? Special Requests NONE  Final  ? Culture   Final  ?  NO GROWTH ?Performed at [facility],  [address], [place], [place] [address] ?  ? Report Status 06/01/2021 FINAL  Final  ?Culture, blood (Routine X 2) w Reflex to ID Panel     Status: None (Preliminary result)  ? Collection  Time: 05/31/21  9:00 AM  ? Specimen: BLOOD  ?Result Value Ref Range Status  ? Specimen Description BLOOD BLOOD LEFT HAND  Final  ? Special Requests   Final  ?  BOTTLES DRAWN AEROBIC AND ANAEROBIC Blood Culture adequate volume  ? Culture   Final  ?  NO GROWTH 4 DAYS ?Performed at [facility], [address] [place], [place] [address] ?  ? Report Status PENDING  Incomplete  ?Culture, blood (Routine X 2) w Reflex to ID Panel     Status: None (Preliminary result)  ? Collection Time: 05/31/21  9:06 AM  ? Specimen: BLOOD  ?Result Value Ref Range Status  ? Specimen Description BLOOD BLOOD RIGHT ARM  Final  ? Special Requests   Final  ?  BOTTLES DRAWN AEROBIC AND ANAEROBIC Blood Culture adequate volume  ? Culture   Final  ?  NO GROWTH 4 DAYS ?Performed at [facility], [address], [place], [place] [address] ?  ? Report Status PENDING  Incomplete  ?  ? ?Labs: ? ?COVID-19 Labs ? ? ?Lab Results  ?Component Value Date  ? SARSCOV2NAA POSITIVE (A) 02/27/2020  ? ? ? ? ?Basic Metabolic Panel: ?Recent Labs  ?Lab 05/30/21 ?0425 05/31/21 ?0159 06/01/21 ?8144 06/02/21 ?8185 06/03/21 ?6314  ?NA 136 133* 135 135 134*  ?K 4.2 5.1 5.5* 4.4 3.9  ?CL 94* 100 108 107 105  ?CO2 33* 26 21* 22 24  ?GLUCOSE 164* 111* 179* 141* 135*  ?BUN 45* 39* 38* 29* 23  ?CREATININE 1.72* 1.51* 1.56* 1.32* 1.31*  ?CALCIUM 8.4* 8.4* 8.2* 8.0* 8.1*  ?MG 2.1  --  2.2 2.0 1.9  ?PHOS 3.2  --  3.8 3.4 3.5  ? ?Liver Function Tests: ?Recent Labs  ?Lab 05/30/21 ?0425 06/02/21 ?0420  ?AST 19 13*  ?ALT 13 10  ?ALKPHOS 69 58  ?BILITOT 0.5 0.4  ?PROT 6.9 6.1*  ?ALBUMIN 2.3* 1.8*  ? ? ?CBC: ?Recent Labs  ?Lab 05/30/21 ?0425 05/31/21 ?0159 06/01/21 ?9702 06/02/21 ?6378 06/03/21 ?5885  ?WBC 15.0* 15.5* 20.0* 17.7* 16.4*  ?NEUTROABS 10.9* 13.8* 16.8* 14.7* 13.3*  ?HGB 7.7* 7.7* 6.9* 7.4* 7.4*  ?HCT 26.5* 24.8* 22.5* 24.3* 24.6*  ?MCV 76.6* 74.9* 75.8* 78.6* 79.4*  ?PLT 176 199 196 210 265  ? ? ?CBG: ?Recent Labs  ?Lab 06/01/21 ?0735 06/01/21 ?2053  06/02/21 ?0729 06/02/21 ?2049 06/03/21 ?1121  ?GLUCAP 169* 146* 160* 128* 118*  ? ? ? ?IMAGING STUDIES ?CT ABDOMEN PELVIS WO CONTRAST ? ?Result Date: 05/31/2021 ?CLINICAL DATA:  Abdominal pain, postop. EXAM: CT ABDOMEN AND PELVIS WITHOUT CONTRAST TECHNIQUE: Multidetector CT imaging of the abdomen and pelvis was performed following the standard protocol without IV contrast. RADIATION DOSE REDUCTION: This exam was performed according to the departmental dose-optimization program which includes automated exposure control, adjustment of the mA and/or kV according to patient size and/or use of iterative reconstruction technique. COMPARISON:  05/20/2021. FINDINGS: Lower chest: Heart is normal in size and there is a trace pericardial effusion. Bilateral lower lobe bronchiectasis is noted with atelectasis at the lung bases. Hepatobiliary: No focal liver abnormality is seen. No gallstones, gallbladder wall thickening, or biliary dilatation. Pancreas: Unremarkable. No pancreatic ductal dilatation or surrounding inflammatory changes. Spleen: Normal in size without focal abnormality.  Adrenals/Urinary Tract: The adrenal glands are within normal limits. No renal calculus is seen bilaterally. Multiple renal cysts are present on the left, a few with calcification. No hydronephrosis. A small focus of air is present in the urinary bladder. Stomach/Bowel: Mildly distended loops of small bowel with air-fluid levels are noted in the abdomen. No transition point is identified. Scattered diverticula are present along the colon without evidence of diverticulitis. Locules of free air are noted in the abdomen which is likely related to postoperative status. No pneumatosis. The appendix is within normal limits. Contrast is present in the colon with large amount of stool in the rectosigmoid colon. Vascular/Lymphatic: Aortic atherosclerosis. No enlarged abdominal or pelvic lymph nodes. Reproductive: Prostate is unremarkable. Other: Postsurgical  changes in air and surgical staples are noted in the abdominal wall in the midline. No ascites. Musculoskeletal: Degenerative changes are present in the thoracolumbar spine. IMPRESSION: 1. Postsurgical changes in the

## 2021-06-03 NOTE — Progress Notes (Signed)
DISCHARGE NOTE HOME ?Justin Yang to be discharged Home per MD order. Discussed prescriptions and follow up appointments with the patient. Prescriptions given to patient; medication list explained in detail. Patient verbalized understanding. ? ?Skin clean, dry and intact without evidence of skin break down, no evidence of skin tears noted. IV catheter discontinued intact. Site without signs and symptoms of complications. Dressing and pressure applied. Pt denies pain at the site currently. No complaints noted. ? ?Patient free of lines, drains, and wounds.  ? ?An After Visit Summary (AVS) was printed and given to the patient. ?Patient escorted via wheelchair, and discharged home via private auto. ? ?[name] [name], RN   ?

## 2021-06-03 NOTE — Progress Notes (Signed)
PHARMACY - [facility] NUTRITION CONSULT NOTE  ? ?Indication: Small bowel obstruction ? ?Patient Measurements: ?Height: 5\' 11"  (180.3 cm) ?Weight: 69.7 kg (153 lb 9.6 oz) ?IBW/kg (Calculated) : 75.3 ?TPN AdjBW (KG): 70.1 ?Body mass index is 21.42 kg/m?. ?Usual Weight: 81.8 kg ? ?Assessment: [name] with PMH of T2DM, HLD, chronic microcytic anemia, h/o SBO requiring lysis of adhesions (2017) presented to the ED on 4/13 for new onset nausea/vomiting starting on 4/12. KUB on admit was c/w high-grade SBO. Pt was made NPO and NGT was placed for decompression. IVF were initiated. Pt failed to respond to conservative measures and underwent surgery on 4/18 - laparotomy with lysis of adhesions and small bowel resection. ? ?Patient was eating normal diet prior to symptom onset. Pt reports his last meal was bacon, egg, and cheese sandwich from a gas station prior to nausea/vomiting starting on 4/12. Pt has been NPO since admission on 4/13. Pharmacy consulted to initiate and manage TPN therapy on 4/19. ? ?Glucose / Insulin: BG <180, off insulin ?Electrolytes: Na 134, K 3.9, CoCa 9.8, Mg 1.9, others wnl  ?Renal: SCr 1.31, BUN wnl ?Hepatic: LFTs wnl, albumin 1.8. TG 139 ?Intake / Output; MIVF: NS @30ml /hr.  UOP incompletely charted. LBM 4/27.  ?GI Imaging:  ?4/14 CT abd: SBP, large stool burden with impacted stool  ?4/14 KUB: high grade SBO    ?4/15 KUB: persistent partial SBO ?4/16 KUB: persistent partial SBO  ?4/17 KUB: stable SBO ?4/18 KUB: no improvement or worsening in small bowel dilatation. ?4/21 KUB: dilated loops of small bowel c/w post-op ileus or recurrent pSBO ?4/25 CT abd: Multiple loops of mildly dilated small bowel, possible ileus vs partial/early SBO  ?GI Surgeries / Procedures:  ?4/18 - laparostomy with lysis of adhesions and small bowel resection ? ?Central access: PICC placed 4/19 ?TPN start date: 05/25/21 ? ?Nutritional Goals: ?Goal TPN rate is 90 mL/hr (provides 110 g of protein and 2116 kcals per  day) ? ?RD Assessment: ?Estimated Needs ?Total Energy Estimated Needs: 2000-2200 kcal/d ?Total Protein Estimated Needs: 100-110 g/d ?Total Fluid Estimated Needs: >/=2.2 L/d ? ?Current Nutrition:  ?TPN + FLD ?4/24 ate: 1 chicken broth, 1 jello, 1 pudding, 1 Boost. Feels a lot of acid reflux every time he eats ?4/25 AM: breakfast tray with grits, ice cream, coffee delivered but patient did not eat anything due to nausea ?4/26: Patient not in room to discuss PO intake, 50% x1 meal charted. Per team, patient still eating very little but would like to halve TPN to see if PO intake improves.  ?4/27: ate tilapia, collard greens, mashed potatoes, soup, banana pudding and more ? ?Plan:  ?Stop TPN after current bag is complete  ? ?5/26, PharmD, BCPS, BCCP ?Clinical Pharmacist ? ?Please check AMION for all [facility] Pharmacy phone numbers ?After 10:00 PM, call Main Pharmacy [phone] ? ?

## 2021-06-05 LAB — CULTURE, BLOOD (ROUTINE X 2)
Culture: NO GROWTH
Culture: NO GROWTH
Special Requests: ADEQUATE
Special Requests: ADEQUATE

## 2021-07-15 DIAGNOSIS — Z09 Encounter for follow-up examination after completed treatment for conditions other than malignant neoplasm: Secondary | ICD-10-CM | POA: Diagnosis not present

## 2021-09-07 ENCOUNTER — Other Ambulatory Visit: Payer: Self-pay | Admitting: *Deleted

## 2021-09-07 NOTE — Patient Outreach (Signed)
  Care Coordination   09/07/2021 Name: Justin Yang MRN: [id number] DOB: 08-03-1955   Care Coordination Outreach Attempts:  An unsuccessful telephone outreach was attempted today to offer the patient information about available care coordination services as a benefit of their health plan.   Follow Up Plan:  Additional outreach attempts will be made to offer the patient care coordination information and services.   Encounter Outcome:  No Answer  Care Coordination Interventions Activated:  No   Care Coordination Interventions:  No, not indicated  at this time, unable to talk to pt.  SIG Roni Friberg C. Burgess Estelle, MSN, [facility] Gerontological Nurse Practitioner [facility] Care Management [phone]

## 2021-09-29 ENCOUNTER — Telehealth: Payer: Self-pay | Admitting: *Deleted

## 2021-09-29 NOTE — Patient Outreach (Signed)
  Care Coordination   09/29/2021 Name: Justin Yang MRN: [id number] DOB: 10/17/1955   Care Coordination Outreach Attempts:  A second unsuccessful outreach was attempted today to offer the patient with information about available care coordination services as a benefit of their health plan.     Follow Up Plan:  Additional outreach attempts will be made to offer the patient care coordination information and services.   Encounter Outcome:  No Answer  Care Coordination Interventions Activated:  No   Care Coordination Interventions:  No, not indicated    Irving Shows [facility], BSN RN Case Manager [phone]

## 2021-09-30 ENCOUNTER — Telehealth: Payer: Self-pay | Admitting: *Deleted

## 2021-09-30 NOTE — Patient Outreach (Signed)
  Care Coordination   09/30/2021 Name: Justin Yang MRN: [id number] DOB: 1956/01/01   Care Coordination Outreach Attempts:  A third unsuccessful outreach was attempted today to offer the patient with information about available care coordination services as a benefit of their health plan.   Follow Up Plan:  No further outreach attempts will be made at this time. We have been unable to contact the patient to offer or enroll patient in care coordination services  Encounter Outcome:  No Answer  Care Coordination Interventions Activated:  No   Care Coordination Interventions:  No, not indicated    Irving Shows [facility], BSN RN Case Manager [phone]

## 2022-07-27 DIAGNOSIS — D509 Iron deficiency anemia, unspecified: Secondary | ICD-10-CM | POA: Diagnosis not present

## 2022-07-27 DIAGNOSIS — Z1159 Encounter for screening for other viral diseases: Secondary | ICD-10-CM | POA: Diagnosis not present

## 2022-07-27 DIAGNOSIS — I700 Atherosclerosis of aorta: Secondary | ICD-10-CM | POA: Diagnosis not present

## 2022-07-27 DIAGNOSIS — Z125 Encounter for screening for malignant neoplasm of prostate: Secondary | ICD-10-CM | POA: Diagnosis not present

## 2022-07-27 DIAGNOSIS — E1140 Type 2 diabetes mellitus with diabetic neuropathy, unspecified: Secondary | ICD-10-CM | POA: Diagnosis not present

## 2022-07-27 DIAGNOSIS — E1169 Type 2 diabetes mellitus with other specified complication: Secondary | ICD-10-CM | POA: Diagnosis not present

## 2022-07-27 DIAGNOSIS — Z114 Encounter for screening for human immunodeficiency virus [HIV]: Secondary | ICD-10-CM | POA: Diagnosis not present

## 2022-07-27 DIAGNOSIS — Z79899 Other long term (current) drug therapy: Secondary | ICD-10-CM | POA: Diagnosis not present

## 2022-07-27 DIAGNOSIS — E782 Mixed hyperlipidemia: Secondary | ICD-10-CM | POA: Diagnosis not present

## 2022-08-01 ENCOUNTER — Emergency Department (HOSPITAL_COMMUNITY)
Admission: EM | Admit: 2022-08-01 | Discharge: 2022-08-01 | Disposition: A | Discharge status: Home / Self Care | Payer: No Typology Code available for payment source | Attending: Emergency Medicine | Admitting: Emergency Medicine

## 2022-08-01 ENCOUNTER — Encounter (HOSPITAL_COMMUNITY): Payer: Self-pay

## 2022-08-01 ENCOUNTER — Other Ambulatory Visit: Payer: Self-pay

## 2022-08-01 ENCOUNTER — Emergency Department (HOSPITAL_COMMUNITY): Payer: No Typology Code available for payment source

## 2022-08-01 DIAGNOSIS — E119 Type 2 diabetes mellitus without complications: Secondary | ICD-10-CM | POA: Insufficient documentation

## 2022-08-01 DIAGNOSIS — M25561 Pain in right knee: Secondary | ICD-10-CM | POA: Insufficient documentation

## 2022-08-01 DIAGNOSIS — Z7984 Long term (current) use of oral hypoglycemic drugs: Secondary | ICD-10-CM | POA: Insufficient documentation

## 2022-08-01 LAB — SYNOVIAL CELL COUNT + DIFF, W/ CRYSTALS
Crystals, Fluid: NONE SEEN
Eosinophils-Synovial: 0 % (ref 0–1)
Lymphocytes-Synovial Fld: 52 % — ABNORMAL HIGH (ref 0–20)
Monocyte-Macrophage-Synovial Fluid: 46 % — ABNORMAL LOW (ref 50–90)
Neutrophil, Synovial: 2 % (ref 0–25)
WBC, Synovial: 335 /mm3 — ABNORMAL HIGH (ref 0–200)

## 2022-08-01 LAB — BODY FLUID CULTURE W GRAM STAIN

## 2022-08-01 MED ORDER — LIDOCAINE-EPINEPHRINE (PF) 2 %-1:200000 IJ SOLN
10.0000 mL | Freq: Once | INTRAMUSCULAR | Status: AC
  Administered 2022-08-01: 10 mL via INTRADERMAL
  Filled 2022-08-01: qty 20

## 2022-08-01 MED ORDER — OXYCODONE-ACETAMINOPHEN 5-325 MG PO TABS: 1.0000 | ORAL_TABLET | Freq: Three times a day (TID) | ORAL | 0 refills | Status: AC | PRN

## 2022-08-01 MED ORDER — OXYCODONE-ACETAMINOPHEN 5-325 MG PO TABS
1.0000 | ORAL_TABLET | Freq: Once | ORAL | Status: AC
  Administered 2022-08-01: 1 via ORAL
  Filled 2022-08-01: qty 1

## 2022-08-01 MED ORDER — IBUPROFEN 400 MG PO TABS
600.0000 mg | ORAL_TABLET | Freq: Once | ORAL | Status: AC
  Administered 2022-08-01: 600 mg via ORAL
  Filled 2022-08-01: qty 1

## 2022-08-01 MED ORDER — MELOXICAM 7.5 MG PO TABS: 7.5000 mg | ORAL_TABLET | Freq: Every day | ORAL | 0 refills | Status: AC

## 2022-08-01 MED ORDER — PREDNISONE 20 MG PO TABS
60.0000 mg | ORAL_TABLET | Freq: Once | ORAL | Status: AC
  Administered 2022-08-01: 60 mg via ORAL
  Filled 2022-08-01: qty 3

## 2022-08-01 MED ORDER — PREDNISONE 10 MG (21) PO TBPK: ORAL_TABLET | Freq: Every day | ORAL | 0 refills | Status: AC

## 2022-08-01 NOTE — ED Notes (Signed)
ED Provider at bedside. 

## 2022-08-01 NOTE — Discharge Instructions (Addendum)
There were prescriptions sent to your pharmacy: -Prednisone is a steroid that limits inflammation and may help your arthritis. Take this in tapering dose as prescribed. -Meloxicam is an NSAID. Take this instead of over-the counter NSAIDs (motrin, naproxen) for the next 7 days. -Percocet is a narcotic pain medication. Take only as needed.  Call the number below to schedule a follow-up appointment with an orthopedic doctor.  Return to the emergency department for any new or worsening symptoms of concern.

## 2022-08-01 NOTE — ED Provider Notes (Signed)
[place] EMERGENCY DEPARTMENT AT [facility] Provider Note   CSN: [id number] Arrival date & time: 08/01/22  8295     History  Chief Complaint  Patient presents with   Knee Pain    Justin Yang is a 67 y.o. male.   Knee Pain Patient presents for right knee pain.  Medical history includes DM, anemia, HLD.  He denies any recent injuries.  Over the past month, he has had progressive pain and swelling to the area of his right knee.  He typically does not take anything for pain.  He did take some Tylenol arthritis yesterday with minimal relief.  Patient works in [employer].  He was able to go to work and ambulate throughout the day yesterday.  He has been walking with an antalgic gait.  Today, his pain was worsened and he is no longer able to put weight on his right leg.  Patient denies any other areas of discomfort.  He denies any systemic symptoms.     Home Medications Prior to Admission medications   Medication Sig Start Date End Date Taking? Authorizing Provider  meloxicam (MOBIC) 7.5 MG tablet Take 1 tablet (7.5 mg total) by mouth daily for 7 days. 08/01/22 08/08/22 Yes Gloris Manchester, MD  predniSONE (STERAPRED UNI-PAK 21 TAB) 10 MG (21) TBPK tablet Take by mouth daily. Take 6 tabs by mouth daily  for 2 days, then 5 tabs for 2 days, then 4 tabs for 2 days, then 3 tabs for 2 days, 2 tabs for 2 days, then 1 tab by mouth daily for 2 days 08/01/22  Yes Gloris Manchester, MD  acetaminophen (TYLENOL) 500 MG tablet Take 1 tablet (500 mg total) by mouth every 8 (eight) hours as needed for mild pain. 06/03/21   Osvaldo Shipper, MD  atorvastatin (LIPITOR) 40 MG tablet Take 40 mg by mouth at bedtime. 05/12/21   [provider]  bisacodyl (DULCOLAX) 10 MG suppository Place 1 suppository (10 mg total) rectally daily as needed for moderate constipation. 06/03/21   Meuth, Brooke A, PA-C  cetirizine (ZYRTEC) 10 MG tablet Take 10 mg by mouth at bedtime as needed for allergies. 05/12/21    [provider]  Docusate Sodium (DSS) 100 MG CAPS Take 100 mg by mouth 2 (two) times daily as needed (constipation). 05/12/21   [provider]  gabapentin (NEURONTIN) 800 MG tablet Take 800 mg by mouth 3 (three) times daily.    [provider]  metFORMIN (GLUCOPHAGE-XR) 500 MG 24 hr tablet Take 1,000 mg by mouth 2 (two) times daily with a meal. 05/12/21   [provider]  methocarbamol (ROBAXIN) 500 MG tablet Take 1 tablet (500 mg total) by mouth every 8 (eight) hours as needed for muscle spasms. 06/03/21   Osvaldo Shipper, MD  omeprazole (PRILOSEC) 20 MG capsule Take 20 mg by mouth daily as needed (heartburn). 05/12/21   [provider]  ondansetron (ZOFRAN) 4 MG tablet Take 1 tablet (4 mg total) by mouth every 6 (six) hours as needed for nausea. 06/03/21   Osvaldo Shipper, MD  ondansetron [facility]) 4 MG/2ML SOLN injection Inject 2 mLs (4 mg total) into the vein every 6 (six) hours as needed for nausea. 06/03/21   Osvaldo Shipper, MD  oxyCODONE 10 MG TABS Take 0.5-1 tablets (5-10 mg total) by mouth every 6 (six) hours as needed for severe pain or moderate pain. 06/03/21   Meuth, Lina Sar, PA-C      Allergies    Patient  has no known allergies.    Review of Systems   Review of Systems  Musculoskeletal:  Positive for arthralgias.  All other systems reviewed and are negative.   Physical Exam Updated Vital Signs BP 122/79 (BP Location: Right Arm)   Pulse 81   Temp 98.2 F (36.8 C) (Oral)   Resp 18   Ht 5\' 11"  (1.803 m)   Wt 81.6 kg   SpO2 99%   BMI 25.10 kg/m  Physical Exam Vitals and nursing note reviewed.  Constitutional:      General: He is not in acute distress.    Appearance: Normal appearance. He is well-developed. He is not ill-appearing, toxic-appearing or diaphoretic.  HENT:     Head: Normocephalic and atraumatic.     Right Ear: External ear normal.     Left Ear: External ear normal.     Nose: Nose normal.     Mouth/Throat:      Mouth: Mucous membranes are moist.  Eyes:     Extraocular Movements: Extraocular movements intact.     Conjunctiva/sclera: Conjunctivae normal.  Cardiovascular:     Rate and Rhythm: Normal rate and regular rhythm.  Pulmonary:     Effort: Pulmonary effort is normal. No respiratory distress.  Abdominal:     General: There is no distension.     Palpations: Abdomen is soft.  Musculoskeletal:        General: Swelling and tenderness present.     Cervical back: Normal range of motion and neck supple.  Skin:    General: Skin is warm and dry.     Coloration: Skin is not jaundiced or pale.  Neurological:     General: No focal deficit present.     Mental Status: He is alert and oriented to person, place, and time.     Cranial Nerves: No cranial nerve deficit.     Sensory: No sensory deficit.     Motor: No weakness.     Coordination: Coordination normal.  Psychiatric:        Mood and Affect: Mood normal.        Behavior: Behavior normal.        Thought Content: Thought content normal.        Judgment: Judgment normal.     ED Results / Procedures / Treatments   Labs (all labs ordered are listed, but only abnormal results are displayed) Labs Reviewed  SYNOVIAL CELL COUNT + DIFF, W/ CRYSTALS - Abnormal; Notable for the following components:      Result Value   Color, Synovial STRAW (*)    Appearance-Synovial HAZY (*)    WBC, Synovial 335 (*)    Lymphocytes-Synovial Fld 52 (*)    Monocyte-Macrophage-Synovial Fluid 46 (*)    All other components within normal limits  BODY FLUID CULTURE W GRAM STAIN  GLUCOSE, BODY FLUID OTHER              EKG None  Radiology DG Knee 2 Views Right  Result Date: 08/01/2022 CLINICAL DATA:  Right knee pain EXAM: RIGHT KNEE - 2 VIEW COMPARISON:  None Available. FINDINGS: Preserved bone mineralization. There is slight asymmetric widening of the lateral compartment compared to medial. Small joint effusion. Hyperostosis of the patella. No fracture  identified. IMPRESSION: Slight asymmetric widening of the lateral compartment compared to medial of uncertain etiology. If there is concern of the potential soft tissue abnormality, MRI may be useful when clinically appropriate. Trace joint effusion Electronically Signed   By: Piedad Climes.D.  On: 08/01/2022 10:41    Procedures .Joint Aspiration/Arthrocentesis  Date/Time: 08/01/2022 10:13 AM  Performed by: Gloris Manchester, MD Authorized by: Gloris Manchester, MD   Consent:    Consent obtained:  Verbal   Consent given by:  Patient   Risks, benefits, and alternatives were discussed: yes     Risks discussed:  Bleeding, infection and pain   Alternatives discussed:  No treatment and delayed treatment Universal protocol:    Procedure explained and questions answered to patient or proxy's satisfaction: yes     Patient identity confirmed:  Verbally with patient Location:    Location:  Knee Anesthesia:    Anesthesia method:  Local infiltration   Local anesthetic:  Lidocaine 2% WITH epi Procedure details:    Preparation: Patient was prepped and draped in usual sterile fashion     Needle gauge:  18 G   Approach:  Medial   Aspirate amount:  20cc   Aspirate characteristics:  Yellow   Steroid injected: no     Specimen collected: yes   Post-procedure details:    Dressing:  Adhesive bandage   Procedure completion:  Tolerated well, no immediate complications     Medications Ordered in ED Medications  oxyCODONE-acetaminophen (PERCOCET/ROXICET) 5-325 MG per tablet 1 tablet (has no administration in time range)  predniSONE (DELTASONE) tablet 60 mg (has no administration in time range)  oxyCODONE-acetaminophen (PERCOCET/ROXICET) 5-325 MG per tablet 1 tablet (1 tablet Oral Given 08/01/22 1018)  lidocaine-EPINEPHrine (XYLOCAINE W/EPI) 2 %-1:200000 (PF) injection 10 mL (10 mLs Intradermal Given 08/01/22 1018)  ibuprofen (ADVIL) tablet 600 mg (600 mg Oral Given 08/01/22 1117)    ED Course/ Medical Decision  Making/ A&P                             Medical Decision Making Amount and/or Complexity of Data Reviewed Labs: ordered. Radiology: ordered.  Risk Prescription drug management.   Patient presents for progressive pain and swelling to right knee over the past month.  On arrival in the ED, vital signs notable for moderate hypertension.  Patient is well-appearing on exam.  Right knee is notable for suprapatellar swelling.  There is some associated tenderness.  There is no overlying skin change.  Percocet was given for analgesia.  Patient underwent arthrocentesis, as detailed above.  X-ray imaging shows slight asymmetric widening of the lateral compartment.  Synovial fluid analysis shows WBC count of 335 with a lymphocyte predominance.  This is not consistent with septic joint.  This, in addition to x-ray findings may suggest an erosive process such as rheumatoid arthritis.  Patient would benefit from orthopedic surgery follow-up.  Contact information was provided.  He was prescribed Mobic, prednisone, and Percocet.  Crutches were provided.  Patient was discharged in stable condition.        Final Clinical Impression(s) / ED Diagnoses Final diagnoses:  Acute pain of right knee    Rx / DC Orders ED Discharge Orders          Ordered    predniSONE (STERAPRED UNI-PAK 21 TAB) 10 MG (21) TBPK tablet  Daily        08/01/22 1647    meloxicam (MOBIC) 7.5 MG tablet  Daily        08/01/22 1647              Gloris Manchester, MD 08/01/22 1717

## 2022-08-01 NOTE — ED Triage Notes (Signed)
Pt c/o increasing R knee pain and swelling x1 month.  Pain score 10/10.  Pt has not taken anything for pain.  Denies injury.

## 2022-08-01 NOTE — Progress Notes (Signed)
Orthopedic Tech Progress Note Patient Details:  Justin Yang Jun 09, 1955 [id number]  Gave patient crutches and showed them how to use them.        Blase Mess 08/01/2022, 5:46 PM

## 2022-08-02 DIAGNOSIS — I509 Heart failure, unspecified: Secondary | ICD-10-CM | POA: Diagnosis not present

## 2022-08-02 DIAGNOSIS — Z79899 Other long term (current) drug therapy: Secondary | ICD-10-CM | POA: Diagnosis not present

## 2022-08-02 DIAGNOSIS — Z0000 Encounter for general adult medical examination without abnormal findings: Secondary | ICD-10-CM | POA: Diagnosis not present

## 2022-08-02 DIAGNOSIS — E782 Mixed hyperlipidemia: Secondary | ICD-10-CM | POA: Diagnosis not present

## 2022-08-02 DIAGNOSIS — E1140 Type 2 diabetes mellitus with diabetic neuropathy, unspecified: Secondary | ICD-10-CM | POA: Diagnosis not present

## 2022-08-02 DIAGNOSIS — I700 Atherosclerosis of aorta: Secondary | ICD-10-CM | POA: Diagnosis not present

## 2022-08-02 DIAGNOSIS — E1169 Type 2 diabetes mellitus with other specified complication: Secondary | ICD-10-CM | POA: Diagnosis not present

## 2022-08-02 LAB — BODY FLUID CULTURE W GRAM STAIN: Culture: NO GROWTH

## 2022-08-02 LAB — GLUCOSE, BODY FLUID OTHER: Glucose, Body Fluid Other: 144 mg/dL

## 2022-08-04 LAB — BODY FLUID CULTURE W GRAM STAIN

## 2023-10-01 ENCOUNTER — Other Ambulatory Visit: Payer: Self-pay

## 2023-10-01 ENCOUNTER — Encounter (HOSPITAL_COMMUNITY): Payer: Self-pay | Admitting: Emergency Medicine

## 2023-10-01 ENCOUNTER — Emergency Department (HOSPITAL_COMMUNITY)
Admission: EM | Admit: 2023-10-01 | Discharge: 2023-10-01 | Disposition: A | Discharge status: Home / Self Care | Attending: Emergency Medicine | Admitting: Emergency Medicine

## 2023-10-01 ENCOUNTER — Emergency Department (HOSPITAL_COMMUNITY)

## 2023-10-01 DIAGNOSIS — Z7984 Long term (current) use of oral hypoglycemic drugs: Secondary | ICD-10-CM | POA: Diagnosis not present

## 2023-10-01 DIAGNOSIS — E119 Type 2 diabetes mellitus without complications: Secondary | ICD-10-CM | POA: Insufficient documentation

## 2023-10-01 DIAGNOSIS — M25562 Pain in left knee: Secondary | ICD-10-CM | POA: Insufficient documentation

## 2023-10-01 MED ORDER — OXYCODONE HCL 5 MG PO TABS: 5.0000 mg | ORAL_TABLET | ORAL | 0 refills | Status: AC | PRN

## 2023-10-01 MED ORDER — ACETAMINOPHEN 500 MG PO TABS
1000.0000 mg | ORAL_TABLET | Freq: Once | ORAL | Status: AC
  Administered 2023-10-01: 1000 mg via ORAL
  Filled 2023-10-01: qty 2

## 2023-10-01 NOTE — Discharge Instructions (Addendum)
 You were evaluated in the emergency room for left knee pain.  Your knee x-ray showed mild joint narrowing which is consistent with arthritis.  You were provided a referral for orthopedics.  Please call make and make an appointment.  If you experience worsening symptoms fevers or chills please return emergency room.

## 2023-10-01 NOTE — ED Provider Notes (Signed)
 [place] EMERGENCY DEPARTMENT AT [facility] Provider Note   CSN: [id number] Arrival date & time: 10/01/23  9096     Patient presents with: No chief complaint on file.   Justin Yang is a 68 y.o. male with history of diabetes, hyperlipidemia presents with complaints of left knee pain x 1 week.  Denies any injury or trauma.  No numbness or tingling.  Has been evaluated last year for similar complaints in his right knee.  An arthrocentesis was performed at that time.  Was most concerning for erosive process such as rheumatoid arthritis.  Patient was referred to Ortho, however patient reports that he never followed up with them.   HPI  Past Medical History:  Diagnosis Date   Diabetes mellitus without complication [facility])    Past Surgical History:  Procedure Laterality Date   LAPAROSCOPY N/A 06/28/2015   Procedure: LAPAROSCOPY DIAGNOSTIC;  Surgeon: Vicenta Poli, MD;  Location: [facility] OR;  Service: General;  Laterality: N/A;   LAPAROTOMY N/A 06/28/2015   Procedure: LYSIS OF ADHESIONS;  Surgeon: Vicenta Poli, MD;  Location: [facility] OR;  Service: General;  Laterality: N/A;   LAPAROTOMY N/A 05/24/2021   Procedure: EXPLORATORY LAPAROTOMY , POSSIBLE BOWEL RESECTION;  Surgeon: Ebbie Cough, MD;  Location: [facility] OR;  Service: General;  Laterality: N/A;   SHOULDER SURGERY         Prior to Admission medications   Medication Sig Start Date End Date Taking? Authorizing Provider  acetaminophen  (TYLENOL ) 500 MG tablet Take 1 tablet (500 mg total) by mouth every 8 (eight) hours as needed for mild pain. 06/03/21   Krishnan, Gokul, MD  atorvastatin (LIPITOR) 40 MG tablet Take 40 mg by mouth at bedtime. 05/12/21   [provider]  bisacodyl  (DULCOLAX) 10 MG suppository Place 1 suppository (10 mg total) rectally daily as needed for moderate constipation. 06/03/21   Meuth, Brooke A, PA-C  cetirizine (ZYRTEC) 10 MG tablet Take 10 mg by mouth at bedtime as needed for allergies. 05/12/21    [provider]  Docusate Sodium  (DSS) 100 MG CAPS Take 100 mg by mouth 2 (two) times daily as needed (constipation). 05/12/21   [provider]  gabapentin (NEURONTIN) 800 MG tablet Take 800 mg by mouth 3 (three) times daily.    [provider]  metFORMIN (GLUCOPHAGE-XR) 500 MG 24 hr tablet Take 1,000 mg by mouth 2 (two) times daily with a meal. 05/12/21   [provider]  methocarbamol  (ROBAXIN ) 500 MG tablet Take 1 tablet (500 mg total) by mouth every 8 (eight) hours as needed for muscle spasms. 06/03/21   Krishnan, Gokul, MD  omeprazole (PRILOSEC) 20 MG capsule Take 20 mg by mouth daily as needed (heartburn). 05/12/21   [provider]  ondansetron  (ZOFRAN ) 4 MG tablet Take 1 tablet (4 mg total) by mouth every 6 (six) hours as needed for nausea. 06/03/21   Krishnan, Gokul, MD  ondansetron  (ZOFRAN ) 4 MG/2ML SOLN injection Inject 2 mLs (4 mg total) into the vein every 6 (six) hours as needed for nausea. 06/03/21   Krishnan, Gokul, MD  oxyCODONE  10 MG TABS Take 0.5-1 tablets (5-10 mg total) by mouth every 6 (six) hours as needed for severe pain or moderate pain. 06/03/21   Meuth, Brooke A, PA-C  predniSONE  (STERAPRED UNI-PAK 21 TAB) 10 MG (21) TBPK tablet Take by mouth daily. Take 6 tabs by mouth daily  for 2 days, then 5 tabs for 2 days, then 4 tabs for 2 days, then 3  tabs for 2 days, 2 tabs for 2 days, then 1 tab by mouth daily for 2 days 08/01/22   Melvenia Motto, MD    Allergies: Patient has no known allergies.    Review of Systems  Musculoskeletal:  Positive for arthralgias.    Updated Vital Signs BP 121/79 (BP Location: Right Arm)   Pulse 80   Temp 97.7 F (36.5 C)   Resp 18   SpO2 99%   Physical Exam Vitals and nursing note reviewed.  Constitutional:      General: He is not in acute distress.    Appearance: He is well-developed.  HENT:     Head: Normocephalic and atraumatic.  Eyes:     Conjunctiva/sclera: Conjunctivae normal.  Cardiovascular:      Rate and Rhythm: Normal rate and regular rhythm.     Heart sounds: No murmur heard. Pulmonary:     Effort: Pulmonary effort is normal. No respiratory distress.     Breath sounds: Normal breath sounds.  Abdominal:     Palpations: Abdomen is soft.     Tenderness: There is no abdominal tenderness.  Musculoskeletal:        General: Swelling present.     Cervical back: Neck supple.     Comments: Mild effusion involving the left knee, tolerates full range of motion without discomfort, no overlying erythema or warmth, knee is stable to varus and valgus, mild tenderness to medial joint line.  5 out of 5 lower extremity strength, sensation intact, negative Homans.  He is able to ambulate  Skin:    General: Skin is warm and dry.     Capillary Refill: Capillary refill takes less than 2 seconds.  Neurological:     Mental Status: He is alert.  Psychiatric:        Mood and Affect: Mood normal.     (all labs ordered are listed, but only abnormal results are displayed) Labs Reviewed - No data to display  EKG: None  Radiology: No results found.   Procedures   Medications Ordered in the ED - No data to display  Clinical Course as of 10/01/23 1044  Mon Oct 01, 2023  9050 Patient evaluated for atraumatic left knee pain x 1 week.  Pain seems to be constant, not associated with any systemic symptoms.  He is hemodynamically stable.  On exam he has a mild effusion, but tolerates full range of motion without discomfort, no overlying erythema or warmth.  Mild tenderness to the medial joint line.  Overall his exam is most consistent with musculoskeletal etiology such as arthritis versus acute on chronic soft tissue injury involving meniscus or MCL.  Do not feel that any labs are indicated furthermore do not feel that arthrocentesis is indicated today.  I recommended Ortho follow-up and utilizing knee brace with Tylenol , ibuprofen , ice as needed.  Patient is agreement with plan. [JT]  1041 DG Knee  Complete 4 Views Left Mild joint narrowing with mild effusion [JT]    Clinical Course User Index [JT] Donnajean Lynwood DEL, PA-C                                 Medical Decision Making Amount and/or Complexity of Data Reviewed Radiology: ordered. Decision-making details documented in ED Course.  Risk OTC drugs. Prescription drug management.   This patient presents to the ED with chief complaint(s) of knee pain.  The complaint involves an extensive differential diagnosis and  also carries with it a high risk of complications and morbidity.   Pertinent past medical history as listed in HPI  The differential diagnosis includes  Considered gout or septic joint, however patient tolerates full range of motion of his knee without discomfort.  Has no overlying erythema or warmth.  He is additionally without any systemic symptoms.  No injury or trauma to be suspicious for fracture or dislocation. Additional history obtained: Records reviewed Care Everywhere/External Records  Disposition:   Patient will be discharged home. The patient has been appropriately medically screened and/or stabilized in the ED. I have low suspicion for any other emergent medical condition which would require further screening, evaluation or treatment in the ED or require inpatient management. At time of discharge the patient is hemodynamically stable and in no acute distress. I have discussed work-up results and diagnosis with patient and answered all questions. Patient is agreeable with discharge plan. We discussed strict return precautions for returning to the emergency department and they verbalized understanding.     Social Determinants of Health:   none  This note was dictated with voice recognition software.  Despite best efforts at proofreading, errors may have occurred which can change the documentation meaning.       Final diagnoses:  Acute pain of left knee    ED Discharge Orders     None           Donnajean Lynwood DEL, PA-C 10/01/23 1044    Pamella Sharper A, DO 10/03/23 1628

## 2023-10-01 NOTE — ED Triage Notes (Signed)
 PT comes in for 10/10 left knee pain x 1 week. No known injury or aggravating activity.  Pt is ambulatory but limping. SABRA

## 2023-10-09 ENCOUNTER — Ambulatory Visit: Admitting: Orthopaedic Surgery

## 2023-12-10 ENCOUNTER — Encounter: Payer: Self-pay | Admitting: Radiology
